# Patient Record
Sex: Female | Born: 1955 | Race: White | Hispanic: No | Marital: Married | Smoking: Never smoker
Health system: Southern US, Community
[De-identification: ages and names within clinical notes are randomized; demographics above are authoritative.]

## PROBLEM LIST (undated history)

## (undated) DIAGNOSIS — Z9119 Patient's noncompliance with other medical treatment and regimen: Secondary | ICD-10-CM

## (undated) DIAGNOSIS — I10 Essential (primary) hypertension: Secondary | ICD-10-CM

## (undated) DIAGNOSIS — R42 Dizziness and giddiness: Secondary | ICD-10-CM

## (undated) DIAGNOSIS — Z91199 Patient's noncompliance with other medical treatment and regimen due to unspecified reason: Secondary | ICD-10-CM

## (undated) DIAGNOSIS — I639 Cerebral infarction, unspecified: Secondary | ICD-10-CM

## (undated) DIAGNOSIS — M8580 Other specified disorders of bone density and structure, unspecified site: Secondary | ICD-10-CM

## (undated) DIAGNOSIS — R809 Proteinuria, unspecified: Secondary | ICD-10-CM

## (undated) DIAGNOSIS — G629 Polyneuropathy, unspecified: Secondary | ICD-10-CM

## (undated) DIAGNOSIS — E119 Type 2 diabetes mellitus without complications: Secondary | ICD-10-CM

## (undated) HISTORY — PX: ABDOMINAL HYSTERECTOMY: SHX81

## (undated) HISTORY — DX: Proteinuria, unspecified: R80.9

## (undated) HISTORY — DX: Essential (primary) hypertension: I10

## (undated) HISTORY — DX: Type 2 diabetes mellitus without complications: E11.9

## (undated) HISTORY — PX: BACK SURGERY: SHX140

## (undated) HISTORY — DX: Patient's noncompliance with other medical treatment and regimen due to unspecified reason: Z91.199

## (undated) HISTORY — DX: Patient's noncompliance with other medical treatment and regimen: Z91.19

## (undated) HISTORY — DX: Other specified disorders of bone density and structure, unspecified site: M85.80

## (undated) HISTORY — DX: Dizziness and giddiness: R42

---

## 2005-07-27 ENCOUNTER — Ambulatory Visit (HOSPITAL_COMMUNITY): Admission: RE | Admit: 2005-07-27 | Discharge: 2005-07-27 | Payer: Self-pay | Admitting: Family Medicine

## 2008-12-09 ENCOUNTER — Ambulatory Visit (HOSPITAL_COMMUNITY): Admission: RE | Admit: 2008-12-09 | Discharge: 2008-12-09 | Payer: Self-pay | Admitting: Obstetrics and Gynecology

## 2009-01-07 ENCOUNTER — Ambulatory Visit: Admission: RE | Admit: 2009-01-07 | Discharge: 2009-01-07 | Payer: Self-pay | Admitting: Gynecology

## 2009-01-07 ENCOUNTER — Encounter: Payer: Self-pay | Admitting: Gynecology

## 2009-01-25 ENCOUNTER — Inpatient Hospital Stay (HOSPITAL_COMMUNITY): Admission: RE | Admit: 2009-01-25 | Discharge: 2009-01-28 | Payer: Self-pay | Admitting: Obstetrics & Gynecology

## 2009-01-25 ENCOUNTER — Encounter: Payer: Self-pay | Admitting: Gynecology

## 2009-03-11 ENCOUNTER — Ambulatory Visit: Admission: RE | Admit: 2009-03-11 | Discharge: 2009-03-11 | Payer: Self-pay | Admitting: Gynecology

## 2009-07-04 ENCOUNTER — Ambulatory Visit (HOSPITAL_COMMUNITY): Admission: RE | Admit: 2009-07-04 | Discharge: 2009-07-04 | Payer: Self-pay | Admitting: Family Medicine

## 2010-11-19 LAB — MISCELLANEOUS TEST

## 2010-11-19 LAB — INHIBIN A: Inhibin-A: <1 pg/mL

## 2010-11-20 LAB — COMPREHENSIVE METABOLIC PANEL
ALT: 13 U/L (ref 0–35)
Alkaline Phosphatase: 62 U/L (ref 39–117)
BUN: 5 mg/dL — ABNORMAL LOW (ref 6–23)
CO2: 28 mEq/L (ref 19–32)
Chloride: 105 mEq/L (ref 96–112)
Glucose, Bld: 215 mg/dL — ABNORMAL HIGH (ref 70–99)
Potassium: 4.5 mEq/L (ref 3.5–5.1)
Sodium: 141 mEq/L (ref 135–145)
Total Bilirubin: 0.4 mg/dL (ref 0.3–1.2)

## 2010-11-20 LAB — DIFFERENTIAL
Basophils Absolute: 0.1 10*3/uL (ref 0.0–0.1)
Eosinophils Absolute: 0.3 10*3/uL (ref 0.0–0.7)
Lymphocytes Relative: 23 % (ref 12–46)
Monocytes Absolute: 0.6 10*3/uL (ref 0.1–1.0)
Neutro Abs: 4.6 10*3/uL (ref 1.7–7.7)
Neutrophils Relative %: 64 % (ref 43–77)

## 2010-11-20 LAB — CBC
HCT: 28.5 % — ABNORMAL LOW (ref 36.0–46.0)
HCT: 34 % — ABNORMAL LOW (ref 36.0–46.0)
Hemoglobin: 9.1 g/dL — ABNORMAL LOW (ref 12.0–15.0)
Hemoglobin: 10.6 g/dL — ABNORMAL LOW (ref 12.0–15.0)
MCHC: 32 g/dL (ref 30.0–36.0)
MCV: 71.2 fL — ABNORMAL LOW (ref 78.0–100.0)
Platelets: 250 10*3/uL (ref 150–400)
RBC: 4 MIL/uL (ref 3.87–5.11)
RBC: 4.87 MIL/uL (ref 3.87–5.11)
RDW: 19.8 % — ABNORMAL HIGH (ref 11.5–15.5)
WBC: 11.7 10*3/uL — ABNORMAL HIGH (ref 4.0–10.5)
WBC: 7.3 10*3/uL (ref 4.0–10.5)

## 2010-11-20 LAB — GLUCOSE, CAPILLARY
Glucose-Capillary: 167 mg/dL — ABNORMAL HIGH (ref 70–99)
Glucose-Capillary: 169 mg/dL — ABNORMAL HIGH (ref 70–99)
Glucose-Capillary: 177 mg/dL — ABNORMAL HIGH (ref 70–99)
Glucose-Capillary: 185 mg/dL — ABNORMAL HIGH (ref 70–99)
Glucose-Capillary: 187 mg/dL — ABNORMAL HIGH (ref 70–99)
Glucose-Capillary: 194 mg/dL — ABNORMAL HIGH (ref 70–99)
Glucose-Capillary: 194 mg/dL — ABNORMAL HIGH (ref 70–99)
Glucose-Capillary: 195 mg/dL — ABNORMAL HIGH (ref 70–99)
Glucose-Capillary: 203 mg/dL — ABNORMAL HIGH (ref 70–99)
Glucose-Capillary: 221 mg/dL — ABNORMAL HIGH (ref 70–99)
Glucose-Capillary: 238 mg/dL — ABNORMAL HIGH (ref 70–99)
Glucose-Capillary: 239 mg/dL — ABNORMAL HIGH (ref 70–99)
Glucose-Capillary: 304 mg/dL — ABNORMAL HIGH (ref 70–99)
Glucose-Capillary: 288 mg/dL — ABNORMAL HIGH (ref 70–99)

## 2010-11-20 LAB — BASIC METABOLIC PANEL
BUN: 2 mg/dL — ABNORMAL LOW (ref 6–23)
CO2: 28 mEq/L (ref 19–32)
Calcium: 8.3 mg/dL — ABNORMAL LOW (ref 8.4–10.5)
Chloride: 104 mEq/L (ref 96–112)
Creatinine, Ser: 0.48 mg/dL (ref 0.4–1.2)
GFR calc Af Amer: >60 mL/min (ref >60)
GFR calc non Af Amer: >60 mL/min (ref >60)
Glucose, Bld: 192 mg/dL — ABNORMAL HIGH (ref 70–99)
Potassium: 4 mEq/L (ref 3.5–5.1)
Sodium: 137 mEq/L (ref 135–145)

## 2010-11-20 LAB — HEMATOCRIT: HCT: 26.8 % — ABNORMAL LOW (ref 36.0–46.0)

## 2010-11-20 LAB — PREGNANCY, URINE: Preg Test, Ur: NEGATIVE

## 2010-11-20 LAB — ABO/RH: ABO/RH(D): A POS

## 2010-11-20 LAB — TYPE AND SCREEN

## 2010-12-15 ENCOUNTER — Ambulatory Visit (HOSPITAL_COMMUNITY)
Admission: RE | Admit: 2010-12-15 | Discharge: 2010-12-15 | Disposition: A | Discharge status: Home / Self Care | Payer: BC Managed Care – PPO | Source: Ambulatory Visit | Attending: Family Medicine | Admitting: Family Medicine

## 2010-12-15 ENCOUNTER — Other Ambulatory Visit: Payer: Self-pay | Admitting: Family Medicine

## 2010-12-15 DIAGNOSIS — R51 Headache: Secondary | ICD-10-CM | POA: Insufficient documentation

## 2010-12-15 DIAGNOSIS — R040 Epistaxis: Secondary | ICD-10-CM | POA: Insufficient documentation

## 2010-12-15 DIAGNOSIS — T148XXA Other injury of unspecified body region, initial encounter: Secondary | ICD-10-CM

## 2010-12-15 DIAGNOSIS — S022XXA Fracture of nasal bones, initial encounter for closed fracture: Secondary | ICD-10-CM | POA: Insufficient documentation

## 2010-12-15 DIAGNOSIS — X58XXXA Exposure to other specified factors, initial encounter: Secondary | ICD-10-CM | POA: Insufficient documentation

## 2010-12-26 NOTE — Consult Note (Signed)
[name] [name], DUERR NO.:  [id number]   MEDICAL RECORD NO.:  [id number]          PATIENT TYPE:  OUT   LOCATION:  GYN                          FACILITY:  [facility]   PHYSICIAN:  De Blanch, M.D.DATE OF BIRTH:  1956/01/30   DATE OF CONSULTATION:  DATE OF DISCHARGE:                                 CONSULTATION   CHIEF COMPLAINT:  Pelvic mass, menometrorrhagia.   HISTORY OF PRESENT ILLNESS:  A 55 year old white married female seen in  consultation at the request of Dr. Henderson Cloud regarding management of a  large abdominal pelvic mass.  The patient apparently presented to Dr.  Henderson Cloud because of heavy vaginal bleeding.  At that time a pelvic mass  was identified.  Ultrasound revealed the mass to be 16.8 x 12.8 x 11.2  cm.  There were some floating internal echoes and no blood flow in the  internal portion of the cyst.  She subsequently has had a CT scan which  does show visual septations with areas of solid nodularity posteriorly  and lateral on the right.  No ascites or adenopathy or any evidence of  metastatic diseases were detected.  CA125 value is 19 units/mL.   The patient has a past history of undergoing a left salpingo-  oophorectomy for an ovarian cyst in 1980.  Otherwise, she has had no  other gynecologic history, although she has not had a pelvic exam in 20  years.  She did have a Pap smear recently that was normal.   PAST MEDICAL HISTORY/MEDICAL ILLNESSES:  1. Diabetes.  2. Hypertension.   CURRENT MEDICATIONS:  1. Metformin.  2. Vasotec.   PAST SURGICAL HISTORY:  Left salpingo-oophorectomy in 1980.   DRUG ALLERGIES:  None.   FAMILY HISTORY:  Mother with breast cancer.  Maternal aunt and cousin  with cervix cancer.  A second aunt with cervix cancer and another aunt  with brain cancer.   SOCIAL HISTORY:  The patient is married.  She comes accompanied by her  husband.  She is not a smoker.   OBSTETRICAL HISTORY:  Gravida 2.   REVIEW OF  SYSTEMS:  A 10-point comprehensive review of systems negative  except as noted above.  The patient really does not have any significant  abdominal or pelvic pain or pressure.   PHYSICAL EXAMINATION:  Height 5 feet 2 inches, weight 164 pounds, blood  pressure 140/90, pulse 80.  GENERAL:  The patient is a healthy white female in no acute distress.  HEENT:  Negative.  NECK:  Supple without thyromegaly.  There is no supraclavicular or  inguinal adenopathy.  ABDOMEN:  Slightly obese.  On palpation there is a mass which is quite  firm extending to just above the umbilicus.  There is no evidence of  ascites.  PELVIC:  EGBUS, vagina, bladder, and urethra are normal.  Cervix appears  normal.  No lesions are noted.  Uterus is difficult to outline.  The  mass does not seem to be deep in the cul-de-sac or pelvis.  RECTOVAGINAL:  Confirms.   IMPRESSION:  1. Complex pelvic mass with internal echoes and  nodularity.  It was      recommended that the patient undergo exploratory laparotomy, total      abdominal hysterectomy, and a resection of the mass which I believe      arises from the right ovary.  2. Menometrorrhagia in a 55 year old.  I recommend that we rule out      any intrauterine pathology.   PROCEDURE NOTE:  Using a Pipelle aspirator endometrial biopsy is  obtained without difficulty.  Uterus sounds approximately 10 cm  anteriorly.   We will await the biopsy report, but in the meantime will prepare for  surgery in collaboration with Dr. Henderson Cloud.  Risks of surgery were  discussed, and we will contact Dr. Huel Coventry office to coordinate a  surgical date.  Will contact the patient with the biopsy reports once  they are available.      De Blanch, M.D.  Electronically Signed     DC/MEDQ  D:  01/07/2009  T:  01/07/2009  Job:  [id number]   cc:   Guy Sandifer. Henderson Cloud, M.D.  Fax: [phone]   Telford Nab, R.N.  [address]  [place], [place] [address]

## 2010-12-26 NOTE — Op Note (Signed)
[name] [name] [name], [name] [name]                ACCOUNT NO.:  [id number]   MEDICAL RECORD NO.:  [id number]          PATIENT TYPE:  INP   LOCATION:  1535                         FACILITY:  [facility]   PHYSICIAN:  De Blanch, M.D.DATE OF BIRTH:  1955/10/03   DATE OF PROCEDURE:  DATE OF DISCHARGE:                               OPERATIVE REPORT   PREOPERATIVE DIAGNOSIS:  Complex pelvic mass.   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   PROCEDURE:  Total abdominal hysterectomy, right salpingo-oophorectomy,  and left salpingectomy.   FIRST ASSISTANT:  Antionette Char, MD, and Telford Nab, R.N.   ANESTHESIA:  General with orotracheal tube.   ESTIMATED BLOOD LOSS:  100 mL.   SURGICAL FINDINGS:  At the time of exploratory laparotomy, the patient  had an approximately 15-cm right ovarian cyst which was smooth on the  surface  with no evidence of excrescences or adhesions.  The frozen  section returned showing this to be a serous cystadenoma as well as a  small area of granulosa cell tumor.  Also on frozen section, the patient  was found to have fibroids, adenomyosis, and uterine polyp.  The patient  previously had the left ovary removed, although the left fallopian tube  remained and was removed at the time of surgery today.   DESCRIPTION OF PROCEDURE:  The patient was brought to the operating  room.  After satisfactory attainment of general anesthesia, she was  placed in modified lithotomy position in Two Strike stirrups.  Anterior  abdominal wall, perineum, and vagina were prepped with Betadine.  Foley  catheter was inserted, and the patient was draped.   The abdomen was entered through a right paramedian incision.  Peritoneal  washings were obtained from the pelvis.  The upper abdomen was explored,  and no abnormal findings were noted.  The omentum was adherent to the  prior peritoneal  incision.  The adhesions were lysed sharply.  The  pelvis was explored with the above-noted findings.  A Bookwalter  retractor was positioned and bowel packed out of the pelvis.   The right peritoneal incision on the pelvic sidewall was created and the  retroperitoneal space opened, identifying the ureter, external iliac  artery, internal iliac artery, and ovarian vessels.  The ovarian vessels  were skeletonized, clamped, cut, free tied, and suture ligated.  The  uterine cornu was grasped with Kelly clamps and parametrial clamp.  The  tube, uterine-ovarian anastomosis, and ovarian ligament were divided.  The right tube and ovary were sent to frozen section with the above-  noted findings.   The left retroperitoneal space was opened.  The ovarian vessels were  skeletonized, clamped, cut, free tied, and suture ligated.  The bladder  flap was advanced with sharp dissection.  The uterine vessels were  skeletonized, clamped, cut, and suture ligated in stepwise fashion.  The  paracervical and cardinal ligaments were clamped, cut, and suture  ligated.   The rectovaginal septum was opened.  The uterosacral ligament and  vaginal angle were cross clamped and the vagina transected from  its  connection to the cervix.  The uterus, cervix, and left tube were handed  off the operative field.  Vaginal angles were transfixed.  The central  portion of the vagina was closed with interrupted figure-of-eight  sutures of zero Vicryl.  The pelvis was irrigated and then found to be  hemostatic.  Frozen section returned with the above-noted findings.  The  procedure was then terminated.   The packs and retractors were removed.  The anterior abdominal wall was  closed in layers, the first being a running mass closure using #1 PDS.  Subcutaneous tissue was irrigated.  Hemostasis was achieved with  cautery.  The skin was closed with skin staples.  A dressing was  applied.  The patient was awakened from  anesthesia and taken to the  recovery room in satisfactory condition.  Sponge, needle, and instrument  counts were correct x2.      De Blanch, M.D.  Electronically Signed     DC/MEDQ  D:  01/25/2009  T:  01/25/2009  Job:  [id number]   cc:   Roseanna Rainbow, M.D.  Fax: [phone]   Telford Nab, R.N.  [address]  [place], [place] [address]   Guy Sandifer. Henderson Cloud, M.D.  Fax: [phone]

## 2010-12-26 NOTE — Consult Note (Signed)
[name] [name], LEVELS NO.:  [id number]   MEDICAL RECORD NO.:  [id number]          PATIENT TYPE:  OUT   LOCATION:  GYN                          FACILITY:  [facility]   PHYSICIAN:  De Blanch, M.D.DATE OF BIRTH:  [birth year]   DATE OF CONSULTATION:  03/11/2009  DATE OF DISCHARGE:                                 CONSULTATION   CHIEF COMPLAINT:  Postoperative followup, granulosa cell tumor.   INTERVAL HISTORY:  The patient underwent a total abdominal hysterectomy  and bilateral salpingo-oophorectomy for a large ovarian tumor on January 25, 2009.  Final pathology showed this to be a 20 cm granulosa cell  tumor.  The endometrium showed proliferative endometrium with an  endometrial polyp.  The patient's left ovary had been previously  removed.  No evidence of metastatic disease was found.  The patient has  had an uncomplicated postoperative course except for a small wound  defect which has been packed and now apparently is superficial.  She is  complaining of hot flashes and some soreness in her wound.   PHYSICAL EXAMINATION:  VITAL SIGNS:  Weight 154 pounds, blood pressure  110/60.  ABDOMEN:  Soft, nontender.  Midline incision is healing well.  There is  a 1 cm defect that has good granulation tissue in it.  There is some  induration around the wound.  No mass, organomegaly, ascites or hernias  are noted.  PELVIC:  EGBUS, vagina, bladder and urethra are normal.  Cervix and  uterus are surgically absent.  The vaginal cuff is healing well.  Bimanual exam reveals minimal postoperative induration.   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   I discussed the natural history of granulosa cell tumors with the  patient and her husband.  They understand there is a small risk of  recurrence.  We will monitor her with serum inhibin and MIS, both will  be drawn today.  I plan on seeing the patient back in 6 months.  We will  alternate visits with Dr.  Henderson Cloud.  She is given a prescription for  Premarin 0.65 mg daily and is given the okay to return to work and other  full-time and unrestricted activities.      De Blanch, M.D.  Electronically Signed     DC/MEDQ  D:  03/11/2009  T:  03/11/2009  Job:  [id number]   cc:   Telford Nab, R.N.  [address]  [place], [place] [address]   Guy Sandifer. Henderson Cloud, M.D.  Fax: [phone]

## 2010-12-29 NOTE — Discharge Summary (Signed)
NAMEMATASHA, [name] [name]                ACCOUNT NO.:  [id number]   MEDICAL RECORD NO.:  [id number]          PATIENT TYPE:  INP   LOCATION:  1535                         [facility]ACILITY:  [facility]   PHYSICIAN:  Roseanna Rainbow, M.D.DATE OF BIRTH:  [birth year]   DATE OF ADMISSION:  01/25/2009  DATE OF DISCHARGE:  01/28/2009                               DISCHARGE SUMMARY   CHIEF COMPLAINT:  The patient is a 55 year old with a pelvic mass and  associated menometrorrhagia who presents for operative management.  Please see the dictated history and physical as per Dr. Stanford Breed  for further details.   HOSPITAL COURSE:  The patient was admitted and underwent a total  abdominal hysterectomy, right salpingo-oophorectomy and left  salpingectomy.  Please see the dictated operative summary.  On  postoperative day one a hemoglobin was 9.1, hematocrit was 28.5, basic  metabolic profile was remarkable for a glucose of 192.  A repeat  hematocrit on June 17 was 26.8.  Her CBGs were initially elevated above  200, her metformin was resumed on postoperative day one and meal  coverage was added with a sliding scale NovoLog regimen.  Her glycemic  control improved on this regimen and her CBGs were under 200.  She was  discharged to home on postoperative day three tolerating a regular diet.   DISCHARGE DIAGNOSES:  1. A right-sided ovarian granulosa cell tumor.  2. Endometrial polyp.  3. Adenomyosis.  4. Anemia.   PROCEDURES:  1. Total abdominal hysterectomy.  2. Right salpingo-oophorectomy.  3. Left salpingectomy.   CONDITION:  Stable.   DIET:  ADA diet.   ACTIVITY:  Pelvic rest, progressive activity.   MEDICATIONS:  Metformin, Vasotec, a prescription was given for Percocet  1 - 2 tablets every 6 hours as needed.   DISPOSITION:  The patient was to follow up in the GYN/Oncology office  for routine postoperative care.      Roseanna Rainbow, M.D.  Electronically Signed     LAJ/MEDQ  D:   01/31/2009  T:  01/31/2009  Job:  [id number]   cc:   Harold Hedge, [phone], MD   Telford Nab, R.N.  [address]  [place], [place] [address]

## 2013-02-16 ENCOUNTER — Encounter: Payer: Self-pay | Admitting: *Deleted

## 2014-02-23 ENCOUNTER — Encounter: Payer: Self-pay | Admitting: Family Medicine

## 2014-06-18 ENCOUNTER — Encounter (HOSPITAL_COMMUNITY): Payer: Self-pay | Admitting: *Deleted

## 2014-06-18 ENCOUNTER — Emergency Department (HOSPITAL_COMMUNITY): Payer: BC Managed Care – PPO

## 2014-06-18 ENCOUNTER — Emergency Department (HOSPITAL_COMMUNITY)
Admission: EM | Admit: 2014-06-18 | Discharge: 2014-06-18 | Disposition: A | Discharge status: Home / Self Care | Payer: BC Managed Care – PPO | Attending: Emergency Medicine | Admitting: Emergency Medicine

## 2014-06-18 DIAGNOSIS — Z9119 Patient's noncompliance with other medical treatment and regimen: Secondary | ICD-10-CM | POA: Diagnosis not present

## 2014-06-18 DIAGNOSIS — E119 Type 2 diabetes mellitus without complications: Secondary | ICD-10-CM | POA: Insufficient documentation

## 2014-06-18 DIAGNOSIS — M5416 Radiculopathy, lumbar region: Secondary | ICD-10-CM | POA: Diagnosis not present

## 2014-06-18 DIAGNOSIS — Z794 Long term (current) use of insulin: Secondary | ICD-10-CM | POA: Diagnosis not present

## 2014-06-18 DIAGNOSIS — M79604 Pain in right leg: Secondary | ICD-10-CM | POA: Diagnosis present

## 2014-06-18 DIAGNOSIS — M549 Dorsalgia, unspecified: Secondary | ICD-10-CM

## 2014-06-18 DIAGNOSIS — Z79899 Other long term (current) drug therapy: Secondary | ICD-10-CM | POA: Insufficient documentation

## 2014-06-18 DIAGNOSIS — I10 Essential (primary) hypertension: Secondary | ICD-10-CM | POA: Diagnosis not present

## 2014-06-18 MED ORDER — CYCLOBENZAPRINE HCL 10 MG PO TABS
5.0000 mg | ORAL_TABLET | Freq: Once | ORAL | Status: AC
  Administered 2014-06-18: 5 mg via ORAL
  Filled 2014-06-18: qty 1

## 2014-06-18 MED ORDER — HYDROCODONE-ACETAMINOPHEN 5-325 MG PO TABS
2.0000 | ORAL_TABLET | Freq: Once | ORAL | Status: AC
  Administered 2014-06-18: 2 via ORAL
  Filled 2014-06-18: qty 2

## 2014-06-18 MED ORDER — CYCLOBENZAPRINE HCL 5 MG PO TABS: 5.0000 mg | ORAL_TABLET | Freq: Three times a day (TID) | ORAL | Status: DC | PRN

## 2014-06-18 MED ORDER — ONDANSETRON 8 MG PO TBDP
8.0000 mg | ORAL_TABLET | Freq: Once | ORAL | Status: AC
  Administered 2014-06-18: 8 mg via ORAL
  Filled 2014-06-18: qty 1

## 2014-06-18 MED ORDER — HYDROMORPHONE HCL 1 MG/ML IJ SOLN
0.5000 mg | Freq: Once | INTRAMUSCULAR | Status: AC
  Administered 2014-06-18: 0.5 mg via INTRAMUSCULAR
  Filled 2014-06-18: qty 1

## 2014-06-18 MED ORDER — HYDROCODONE-ACETAMINOPHEN 5-325 MG PO TABS: 1.0000 | ORAL_TABLET | ORAL | Status: DC | PRN

## 2014-06-18 NOTE — ED Notes (Signed)
Patient verbalizes understanding of discharge instructions, prescription medications, home care, and follow up care. Patient out of department at this time. 

## 2014-06-18 NOTE — Discharge Instructions (Signed)

## 2014-06-18 NOTE — ED Notes (Addendum)
Pt states that she bent over to pick a small ice chest and when she stood back up, the pain is shooting down her right  Leg. Pt states it hurts to walk.

## 2014-06-19 NOTE — ED Provider Notes (Signed)
CSN: [id number]     Arrival date & time 06/18/14  2050 History   First MD Initiated Contact with Patient 06/18/14 2132     Chief Complaint  Patient presents with  . Leg Pain     (Consider location/radiation/quality/duration/timing/severity/associated sxs/prior Treatment) The history is provided by the patient.   [name] [name] is a 58 y.o. female presenting with right lower posterior back and thigh pain which started 2 days ago suddenly while bending over to pick up a small ice chest.  She felt a sudden popping sensation followed by shooting pain to her mid right posterior leg.  Pain is worsened with movement and she cannot stay in any position for long without increased discomfort.  She has taken ibuprofen with minimal relief. She reports similar episode in the past but resolved without delay.  She denies fevers, chills, dysuria, urinary or fecal incontinence or retention.  She does have a history of osteopenia.  No IVDU, no h/o ca.     Past Medical History  Diagnosis Date  . Type 2 diabetes mellitus   . Hypertension   . Microproteinuria   . Noncompliance   . Osteopenia    Past Surgical History  Procedure Laterality Date  . Abdominal hysterectomy     Family History  Problem Relation Age of Onset  . Diabetes Mother    History  Substance Use Topics  . Smoking status: Never Smoker   . Smokeless tobacco: Not on file  . Alcohol Use: No   OB History    No data available     Review of Systems  Constitutional: Negative for fever.  Respiratory: Negative for shortness of breath.   Cardiovascular: Negative for chest pain and leg swelling.  Gastrointestinal: Negative for abdominal pain, constipation and abdominal distention.  Genitourinary: Negative for dysuria, urgency, frequency, flank pain and difficulty urinating.  Musculoskeletal: Positive for back pain. Negative for joint swelling and gait problem.  Skin: Negative for rash.  Neurological: Negative for weakness and  numbness.      Allergies  Review of patient's allergies indicates no known allergies.  Home Medications   Prior to Admission medications   Medication Sig Start Date End Date Taking? Authorizing Provider  ibuprofen (ADVIL,MOTRIN) 200 MG tablet Take 200 mg by mouth every 6 (six) hours as needed.   Yes Historical Provider, MD  insulin glargine (LANTUS) 100 UNIT/ML injection Inject 30 Units into the skin at bedtime.   Yes Historical Provider, MD  metFORMIN (GLUCOPHAGE) 1000 MG tablet Take 1,000 mg by mouth 2 (two) times daily with a meal.   Yes Historical Provider, MD  cyclobenzaprine (FLEXERIL) 5 MG tablet Take 1 tablet (5 mg total) by mouth 3 (three) times daily as needed for muscle spasms. 06/18/14   Burgess AmorJulie Shuntay Everetts, PA-C  HYDROcodone-acetaminophen (NORCO/VICODIN) 5-325 MG per tablet Take 1 tablet by mouth every 4 (four) hours as needed. 06/18/14   Burgess AmorJulie Lajuana Patchell, PA-C   BP 191/67 mmHg  Pulse 59  Temp(Src) 97.9 F (36.6 C) (Oral)  Resp 18  Ht 5\' 2"  (1.575 m)  Wt 153 lb (69.4 kg)  BMI 27.98 kg/m2  SpO2 100% Physical Exam  Constitutional: She appears well-developed and well-nourished.  HENT:  Head: Normocephalic.  Eyes: Conjunctivae are normal.  Neck: Normal range of motion. Neck supple.  Cardiovascular: Normal rate and intact distal pulses.   Pedal pulses normal.  Pulmonary/Chest: Effort normal.  Abdominal: Soft. Bowel sounds are normal. She exhibits no distension and no mass.  Musculoskeletal: Normal range of  motion. She exhibits no edema.       Lumbar back: She exhibits tenderness. She exhibits no swelling, no edema and no spasm.  ttp along right posterior pelvic rim to right SI joint.  No midline lumbar pain.  Neurological: She is alert. She has normal strength. She displays no atrophy and no tremor. No sensory deficit. Gait normal.  Reflex Scores:      Patellar reflexes are 2+ on the right side and 2+ on the left side.      Achilles reflexes are 2+ on the right side and 2+ on the  left side. No strength deficit noted in hip and knee flexor and extensor muscle groups.  Ankle flexion and extension intact.  Skin: Skin is warm and dry.  Psychiatric: She has a normal mood and affect.  Nursing note and vitals reviewed.   ED Course  Procedures (including critical care time) Labs Review Labs Reviewed - No data to display  Imaging Review Dg Lumbar Spine Complete  06/18/2014   CLINICAL DATA:  Low back pain and right hip pain.  Lifting injury.  EXAM: LUMBAR SPINE - COMPLETE 4+ VIEW  COMPARISON:  CT 12/09/2008  FINDINGS: Endplate deformity at the superior aspect of T12 is felt to represent degenerative osteophytosis. There is mild endplate spurring from L1 through L3. There is no evidence acute loss vertebral body height and disc height. No pars fracture. No subluxation.  IMPRESSION: No acute findings of the lumbar spine.   Electronically Signed   By: Genevive BiStewart  Edmunds M.D.   On: 06/18/2014 22:36   Dg Hip Bilateral W/pelvis  06/18/2014   CLINICAL DATA:  Right hip pain and leg pain for 3 days.  EXAM: BILATERAL HIP WITH PELVIS - 4+ VIEW  COMPARISON:  None.  FINDINGS: Hips are located. No evidence of pelvic fracture or sacral fracture. Dedicated views of the left and right hips demonstrate no femoral neck fracture.  IMPRESSION: No evidence of pelvic fracture or femoral neck fracture.   Electronically Signed   By: Genevive BiStewart  Edmunds M.D.   On: 06/18/2014 22:37     EKG Interpretation None      MDM   Final diagnoses:  Back pain  Lumbar radiculopathy, acute    Patients labs and/or radiological studies were viewed and considered during the medical decision making and disposition process. Pt with sciatica.  She was very uncomfortable upon first arrival, given dilaudid 0.5 mg IM so would tolerate moving for xrays.  Pain improved, started to return at end of visit.  Given hydrocodone 2 tabs prior to dc home.  Dg driving.  Also prescribed flexeril, encouraged to continue ibuprofen 600  mg q 8 with food. F/u with pcp in 1 week if not improved. ([place]).  No neuro deficit on exam or by history to suggest emergent or surgical presentation.  Also discussed worsened sx that should prompt immediate re-evaluation including distal weakness, bowel/bladder retention/incontinence.          Burgess AmorJulie Uilani Sanville, PA-C 06/19/14 1337  Glynn OctaveStephen Rancour, MD 06/19/14 1535

## 2014-06-23 ENCOUNTER — Encounter: Payer: Self-pay | Admitting: Family Medicine

## 2014-06-23 ENCOUNTER — Ambulatory Visit (INDEPENDENT_AMBULATORY_CARE_PROVIDER_SITE_OTHER): Payer: BC Managed Care – PPO | Admitting: Family Medicine

## 2014-06-23 VITALS — BP 130/76 | Temp 97.5°F | Ht 62.0 in | Wt 153.4 lb

## 2014-06-23 DIAGNOSIS — M5441 Lumbago with sciatica, right side: Secondary | ICD-10-CM

## 2014-06-23 MED ORDER — HYDROCODONE-ACETAMINOPHEN 5-325 MG PO TABS: ORAL_TABLET | ORAL | Status: DC

## 2014-06-23 NOTE — Progress Notes (Signed)
   Subjective:    Patient ID: [name] StarrJanice P Salvador, female    DOB: 06/01/1956, 58 y.o.   MRN: [id number]  Back Pain This is a new problem. The current episode started in the past 7 days. The problem occurs intermittently. The problem is unchanged. The pain is present in the lumbar spine. The pain radiates to the right thigh. The pain is moderate. The pain is the same all the time. Stiffness is present all day. She has tried NSAIDs, heat and muscle relaxant (hydrocodone) for the symptoms. The treatment provided moderate relief.  [facility] for back pain also.   Patient states that she has no other concerns at this time.    Pain is very severe. Primarily right lumbar radiating into the hip thigh in the calf all the way down to the foot.  At times right foot is feeling numb and weak.   Pain is worsening not improving.  Seen in emergency room ER note reviewed.  No history of ruptured disc  Positive history of insulin-dependent diabetes mellitus Review of Systems  Musculoskeletal: Positive for back pain.  no change in urinary or bowel habits no headache no chest pain ROS otherwise negative     Objective:   Physical Exam  Alert significant distress. HEENT normal. Lungs clear. Heart regular in rhythm. Positive straight leg raise on the right severely tender right sciatic notch tenderness. Sensation diminished lateral foot. Question weakness dorsiflexion right great toe      Assessment & Plan:  Impression sciatica severe worsening plan cannot tolerate prednisone due to steroids. Pain medicine prescribed. MRI lumbar spine. Further recommendations based results. WSL

## 2014-06-28 ENCOUNTER — Ambulatory Visit (HOSPITAL_COMMUNITY)
Admission: RE | Admit: 2014-06-28 | Discharge: 2014-06-28 | Disposition: A | Discharge status: Home / Self Care | Payer: BC Managed Care – PPO | Source: Ambulatory Visit | Attending: Family Medicine | Admitting: Family Medicine

## 2014-06-28 ENCOUNTER — Encounter (HOSPITAL_COMMUNITY): Payer: Self-pay

## 2014-06-28 DIAGNOSIS — R200 Anesthesia of skin: Secondary | ICD-10-CM | POA: Insufficient documentation

## 2014-06-28 DIAGNOSIS — M5127 Other intervertebral disc displacement, lumbosacral region: Secondary | ICD-10-CM | POA: Diagnosis not present

## 2014-06-28 DIAGNOSIS — M545 Low back pain: Secondary | ICD-10-CM | POA: Insufficient documentation

## 2014-06-28 DIAGNOSIS — M79604 Pain in right leg: Secondary | ICD-10-CM | POA: Diagnosis not present

## 2014-06-29 ENCOUNTER — Other Ambulatory Visit: Payer: Self-pay

## 2014-06-29 ENCOUNTER — Encounter: Payer: Self-pay | Admitting: Family Medicine

## 2014-06-29 DIAGNOSIS — M545 Low back pain: Secondary | ICD-10-CM

## 2014-06-29 NOTE — Progress Notes (Signed)
Work not done

## 2014-06-30 ENCOUNTER — Other Ambulatory Visit (HOSPITAL_COMMUNITY): Payer: BC Managed Care – PPO

## 2014-07-05 ENCOUNTER — Telehealth: Payer: Self-pay | Admitting: Family Medicine

## 2014-07-05 NOTE — Telephone Encounter (Signed)
Patient notified that she can have work excuse extended till Dec 1 when she sees neurosurgeon and then the will take over.

## 2014-07-05 NOTE — Telephone Encounter (Signed)
Work exc thru visit, with compr on nerve this will be painful in all positions, we don't longer rec strict bed rest like the old days. rec getting up and walking in the house for a couple minutes sevral times per d even tho it hurts, also get up for meals and bathroom visits

## 2014-07-05 NOTE — Telephone Encounter (Signed)
Pt needs work note or FMLA extended to cover her being out of work until she sees Midwifeneurosurgeon, appointment with Dr. Wynetta Emeryram is 07/13/2014, also pt's a little confused as to what you recommend she is to be doing as far as walking, daily activities?  States there's only one position she's able to lay down and be close to pain free.  [name] [name](Erica may already have FMLA paperwork from [employer])

## 2014-10-19 LAB — HM DIABETES EYE EXAM

## 2014-11-01 ENCOUNTER — Encounter: Payer: Self-pay | Admitting: *Deleted

## 2015-02-02 ENCOUNTER — Encounter: Payer: Self-pay | Admitting: Family Medicine

## 2015-03-11 ENCOUNTER — Other Ambulatory Visit: Payer: Self-pay | Admitting: Internal Medicine

## 2015-03-11 ENCOUNTER — Ambulatory Visit
Admission: RE | Admit: 2015-03-11 | Discharge: 2015-03-11 | Disposition: A | Discharge status: Home / Self Care | Payer: BLUE CROSS/BLUE SHIELD | Source: Ambulatory Visit | Attending: Internal Medicine | Admitting: Internal Medicine

## 2015-03-11 DIAGNOSIS — S99921A Unspecified injury of right foot, initial encounter: Secondary | ICD-10-CM

## 2015-05-17 ENCOUNTER — Ambulatory Visit (INDEPENDENT_AMBULATORY_CARE_PROVIDER_SITE_OTHER): Payer: BLUE CROSS/BLUE SHIELD | Admitting: Family Medicine

## 2015-05-17 ENCOUNTER — Encounter: Payer: Self-pay | Admitting: Family Medicine

## 2015-05-17 ENCOUNTER — Ambulatory Visit (HOSPITAL_COMMUNITY)
Admission: RE | Admit: 2015-05-17 | Discharge: 2015-05-17 | Disposition: A | Discharge status: Home / Self Care | Payer: BLUE CROSS/BLUE SHIELD | Source: Ambulatory Visit | Attending: Family Medicine | Admitting: Family Medicine

## 2015-05-17 ENCOUNTER — Telehealth: Payer: Self-pay | Admitting: *Deleted

## 2015-05-17 VITALS — BP 190/80 | Ht 62.0 in | Wt 158.1 lb

## 2015-05-17 DIAGNOSIS — R918 Other nonspecific abnormal finding of lung field: Secondary | ICD-10-CM | POA: Insufficient documentation

## 2015-05-17 DIAGNOSIS — M549 Dorsalgia, unspecified: Secondary | ICD-10-CM

## 2015-05-17 DIAGNOSIS — R0600 Dyspnea, unspecified: Secondary | ICD-10-CM

## 2015-05-17 DIAGNOSIS — R0602 Shortness of breath: Secondary | ICD-10-CM | POA: Insufficient documentation

## 2015-05-17 DIAGNOSIS — I10 Essential (primary) hypertension: Secondary | ICD-10-CM

## 2015-05-17 LAB — POCT I-STAT CREATININE: Creatinine, Ser: 0.7 mg/dL (ref 0.44–1.00)

## 2015-05-17 MED ORDER — IOHEXOL 350 MG/ML SOLN
100.0000 mL | Freq: Once | INTRAVENOUS | Status: AC | PRN
  Administered 2015-05-17: 100 mL via INTRAVENOUS

## 2015-05-17 MED ORDER — ENALAPRIL MALEATE 20 MG PO TABS: 20.0000 mg | ORAL_TABLET | Freq: Every day | ORAL | Status: DC

## 2015-05-17 NOTE — Telephone Encounter (Signed)
Stat scan results available. Please review

## 2015-05-17 NOTE — Progress Notes (Signed)
   Subjective:    Patient ID: [name] [name], female    DOB: 1955/10/21, 59 y.o.   MRN: [id number]  Hypertension This is a chronic problem. The current episode started more than 1 year ago. Associated symptoms include chest pain.  Sharp pain in back, very painful. Transient  Pain  's struck rather hard for a short period of timeand now has recurred since. This occurred last night. Today she has had some shortness of breath. Not sure why this is going on. Patient does not smoke no history of asthma.  Notes her sister had a very similar bout a few weeks ago ended up in the emergency room and was diagnosed with pulmonary emboli.  No family history of aortic aneurysm. Patient has not smoked. However she has had poorly controlled diabetes and admits to noncompliance with her hypertension. Medications.  No abdominal pain. No true chest pain.     Patient states that she has been on enalapril but has missed doses. Took one tablet of enalapril last night. Has complaints of pain under right shoulder blade.Patient states during pains she has some SOB.  Review of Systems  Cardiovascular: Positive for chest pain.   No nausea no vomiting no change in bowel habits    Objective:   Physical Exam  Alert blood pressure 160/90 on repeat. HEENT normal. Lungs clear no tachypnea no crackles no wheezes heart regular in rhythm abdomen benign no spinal tenderness no CVA tenderness  EKG normal sinus rhythm and nonspecific ST-T changes      Assessment & Plan:  Impression 1 sudden onset of severe back pain improved now. Also significant shortness of breath. discussed at length. With severe onset sudden back pain. Poorly controlled risk factors. Elevated blood pressure. And substantial shortness of breath need to do a scan to rule out or diminished likelihood of pulmonary embolus and aneurysm discussed importance discussed increase enalapril to 20 daily compliance discussed [facility]SL

## 2015-05-17 NOTE — Telephone Encounter (Signed)
Already reviewed with autumn and then the pt

## 2015-05-24 ENCOUNTER — Encounter: Payer: Self-pay | Admitting: Family Medicine

## 2015-05-24 ENCOUNTER — Ambulatory Visit (INDEPENDENT_AMBULATORY_CARE_PROVIDER_SITE_OTHER): Payer: BLUE CROSS/BLUE SHIELD | Admitting: Family Medicine

## 2015-05-24 VITALS — BP 160/80 | Ht 62.0 in | Wt 157.0 lb

## 2015-05-24 DIAGNOSIS — L03211 Cellulitis of face: Secondary | ICD-10-CM

## 2015-05-24 DIAGNOSIS — I10 Essential (primary) hypertension: Secondary | ICD-10-CM | POA: Diagnosis not present

## 2015-05-24 DIAGNOSIS — M549 Dorsalgia, unspecified: Secondary | ICD-10-CM

## 2015-05-24 MED ORDER — DOXYCYCLINE HYCLATE 100 MG PO TABS: 100.0000 mg | ORAL_TABLET | Freq: Two times a day (BID) | ORAL | Status: DC

## 2015-05-24 NOTE — Progress Notes (Signed)
   Subjective:    Patient ID: [name] [name], female    DOB: [birth year], 59 y.o.   MRN: [id number]  Hypertension This is a new problem. The current episode started more than 1 year ago.    Patient in for a one week follow up for hypertension   and back pain. The sharp jabbing pain is now better  Ear acting up the past two weeks  Patient states that back pain has somewhat improved. Still having sharp spells of back pain that much improved overall.  Patient also would also like to discuss ear pain to right ear. External ear visually red with pain to the touch. Noted no significant femur but definitely tender.  Patient has adjusted blood pressure medicine. Now taking higher dose. No obvious side effects.  Patient states no other concerns this visit.    Review of Systems No change in her bowel habits no vomiting no diarrhea complete review systems otherwise negative    Objective:   Physical Exam  Alert vital stable afebrile blood pressure improved 138/84. H&T right ear inflamed erythematous tender to palpation. Supple no spinal tenderness no severe turns lungs clear heart rare rhythm. Abdomen benign.      Assessment & Plan:  Impression 1 right ear cellulitis discussed #2 hypertension clinically improved on high-dose discussed #3 back pain improved plan continue symptom care for back. Add doxycycline twice a day 10 days same blood pressure medicine. Continue Advil for back. Diet exercise discussed warning signs discussed patient wishes to follow-up with her nurse practitioner for blood pressure control. She's diabetes specialist also [facility]

## 2015-11-17 ENCOUNTER — Emergency Department (HOSPITAL_COMMUNITY): Payer: BLUE CROSS/BLUE SHIELD

## 2015-11-17 ENCOUNTER — Emergency Department (HOSPITAL_COMMUNITY)
Admission: EM | Admit: 2015-11-17 | Discharge: 2015-11-17 | Disposition: A | Discharge status: Home / Self Care | Payer: BLUE CROSS/BLUE SHIELD | Attending: Emergency Medicine | Admitting: Emergency Medicine

## 2015-11-17 ENCOUNTER — Encounter (HOSPITAL_COMMUNITY): Payer: Self-pay | Admitting: Emergency Medicine

## 2015-11-17 DIAGNOSIS — L03116 Cellulitis of left lower limb: Secondary | ICD-10-CM | POA: Diagnosis not present

## 2015-11-17 DIAGNOSIS — Z79899 Other long term (current) drug therapy: Secondary | ICD-10-CM | POA: Diagnosis not present

## 2015-11-17 DIAGNOSIS — Z794 Long term (current) use of insulin: Secondary | ICD-10-CM | POA: Diagnosis not present

## 2015-11-17 DIAGNOSIS — E1140 Type 2 diabetes mellitus with diabetic neuropathy, unspecified: Secondary | ICD-10-CM | POA: Diagnosis not present

## 2015-11-17 DIAGNOSIS — I10 Essential (primary) hypertension: Secondary | ICD-10-CM | POA: Insufficient documentation

## 2015-11-17 DIAGNOSIS — Z7984 Long term (current) use of oral hypoglycemic drugs: Secondary | ICD-10-CM | POA: Insufficient documentation

## 2015-11-17 DIAGNOSIS — S92421A Displaced fracture of distal phalanx of right great toe, initial encounter for closed fracture: Secondary | ICD-10-CM | POA: Diagnosis not present

## 2015-11-17 DIAGNOSIS — L089 Local infection of the skin and subcutaneous tissue, unspecified: Secondary | ICD-10-CM | POA: Diagnosis not present

## 2015-11-17 DIAGNOSIS — R21 Rash and other nonspecific skin eruption: Secondary | ICD-10-CM | POA: Diagnosis present

## 2015-11-17 HISTORY — DX: Polyneuropathy, unspecified: G62.9

## 2015-11-17 LAB — CBC WITH DIFFERENTIAL/PLATELET
Basophils Absolute: 0.1 10*3/uL (ref 0.0–0.1)
Basophils Relative: 1 %
EOS PCT: 4 %
Eosinophils Absolute: 0.4 10*3/uL (ref 0.0–0.7)
HEMATOCRIT: 37.7 % (ref 36.0–46.0)
HEMOGLOBIN: 12.1 g/dL (ref 12.0–15.0)
LYMPHS PCT: 26 %
Lymphs Abs: 2.8 10*3/uL (ref 0.7–4.0)
MCH: 26.1 pg (ref 26.0–34.0)
MCHC: 32.1 g/dL (ref 30.0–36.0)
MCV: 81.4 fL (ref 78.0–100.0)
MONO ABS: 1 10*3/uL (ref 0.1–1.0)
Monocytes Relative: 9 %
Neutro Abs: 6.5 10*3/uL (ref 1.7–7.7)
Neutrophils Relative %: 60 %
Platelets: 295 10*3/uL (ref 150–400)
RBC: 4.63 MIL/uL (ref 3.87–5.11)
RDW: 14.7 % (ref 11.5–15.5)
WBC: 10.6 10*3/uL — ABNORMAL HIGH (ref 4.0–10.5)

## 2015-11-17 LAB — COMPREHENSIVE METABOLIC PANEL
ALBUMIN: 3.6 g/dL (ref 3.5–5.0)
ALK PHOS: 65 U/L (ref 38–126)
ALT: 15 U/L (ref 14–54)
ANION GAP: 8 (ref 5–15)
AST: 18 U/L (ref 15–41)
BILIRUBIN TOTAL: 0.4 mg/dL (ref 0.3–1.2)
BUN: 11 mg/dL (ref 6–20)
CALCIUM: 8.9 mg/dL (ref 8.9–10.3)
CO2: 26 mmol/L (ref 22–32)
CREATININE: 0.6 mg/dL (ref 0.44–1.00)
Chloride: 106 mmol/L (ref 101–111)
GFR calc Af Amer: >60 mL/min (ref >60)
GFR calc non Af Amer: >60 mL/min (ref >60)
Glucose, Bld: 113 mg/dL — ABNORMAL HIGH (ref 65–99)
Potassium: 3.7 mmol/L (ref 3.5–5.1)
SODIUM: 140 mmol/L (ref 135–145)
TOTAL PROTEIN: 7.1 g/dL (ref 6.5–8.1)

## 2015-11-17 MED ORDER — DOXYCYCLINE HYCLATE 100 MG PO CAPS: 100.0000 mg | ORAL_CAPSULE | Freq: Two times a day (BID) | ORAL | Status: DC

## 2015-11-17 MED ORDER — DOXYCYCLINE HYCLATE 100 MG PO TABS
100.0000 mg | ORAL_TABLET | Freq: Once | ORAL | Status: AC
  Administered 2015-11-17: 100 mg via ORAL
  Filled 2015-11-17: qty 1

## 2015-11-17 NOTE — ED Notes (Addendum)
Swelling an redness noted to right great toe.  Pt notice ulcer today and notice the swelling on yesterday.  Denies pain but feeling some throbbing.  Pt says right toe has been broken several times in last year and do not have much feeling in right foot since back surgery one year ago.  Edema noted to right ankle and foot.  Ulcer noted to right great toe.

## 2015-11-17 NOTE — ED Provider Notes (Signed)
CSN: [id number]     Arrival date & time 11/17/15  1735 History   First MD Initiated Contact with Patient 11/17/15 1814     Chief Complaint  Patient presents with  . Foot Swelling  . Wound Infection     (Consider location/radiation/quality/duration/timing/severity/associated sxs/prior Treatment) Patient is a 60 y.o. female presenting with rash. The history is provided by the patient (Patient complains of swelling and redness to her right foot.).  Rash Location: Right foot. Quality: not blistering   Severity:  Mild Onset quality:  Sudden Timing:  Constant Progression:  Waxing and waning Chronicity:  New Context: not animal contact   Associated symptoms: no abdominal pain, no diarrhea, no fatigue and no headaches     Past Medical History  Diagnosis Date  . Type 2 diabetes mellitus (HCC)   . Hypertension   . Microproteinuria   . Noncompliance   . Osteopenia   . Neuropathy [facility](HCC)    Past Surgical History  Procedure Laterality Date  . Abdominal hysterectomy    . Back surgery     Family History  Problem Relation Age of Onset  . Diabetes Mother    Social History  Substance Use Topics  . Smoking status: Never Smoker   . Smokeless tobacco: None  . Alcohol Use: No   OB History    No data available     Review of Systems  Constitutional: Negative for appetite change and fatigue.  HENT: Negative for congestion, ear discharge and sinus pressure.   Eyes: Negative for discharge.  Respiratory: Negative for cough.   Cardiovascular: Negative for chest pain.  Gastrointestinal: Negative for abdominal pain and diarrhea.  Genitourinary: Negative for frequency and hematuria.  Musculoskeletal: Negative for back pain.       Patient has swelling and redness to right foot  Skin: Positive for rash.  Neurological: Negative for seizures and headaches.  Psychiatric/Behavioral: Negative for hallucinations.      Allergies  Review of patient's allergies indicates no known  allergies.  Home Medications   Prior to Admission medications   Medication Sig Start Date End Date Taking? Authorizing Provider  Cyanocobalamin (B-12 PO) Take 1 tablet by mouth daily.   Yes Historical Provider, MD  Insulin Glargine (LANTUS SOLOSTAR) 100 UNIT/ML Solostar Pen Inject 46 Units into the skin daily at 10 pm.   Yes Historical Provider, MD  losartan (COZAAR) 100 MG tablet Take 100 mg by mouth daily.   Yes Historical Provider, MD  metFORMIN (GLUCOPHAGE) 500 MG tablet Take 500 mg by mouth 4 (four) times daily.   Yes Historical Provider, MD  doxycycline (VIBRAMYCIN) 100 MG capsule Take 1 capsule (100 mg total) by mouth 2 (two) times daily. One po bid x 7 days 11/17/15   Bethann BerkshireJoseph Chadd Tollison, MD   BP 176/70 mmHg  Pulse 66  Temp(Src) 98.2 F (36.8 C) (Oral)  Resp 17  Ht 5\' 2"  (1.575 m)  Wt 158 lb (71.668 kg)  BMI 28.89 kg/m2  SpO2 98% Physical Exam  Constitutional: She is oriented to person, place, and time. She appears well-developed.  HENT:  Head: Normocephalic.  Eyes: Conjunctivae and EOM are normal. No scleral icterus.  Neck: Neck supple. No thyromegaly present.  Cardiovascular: Normal rate and regular rhythm.  Exam reveals no gallop and no friction rub.   No murmur heard. Pulmonary/Chest: No stridor. She has no wheezes. She has no rales. She exhibits no tenderness.  Abdominal: She exhibits no distension. There is no tenderness. There is no rebound.  Musculoskeletal: Normal  range of motion. She exhibits edema.  Patient has mild deformity to right large toe from old fracture.  Redness around foot and toe. Decreased sensation around foot. Pulses normal  Lymphadenopathy:    She has no cervical adenopathy.  Neurological: She is oriented to person, place, and time. She exhibits normal muscle tone. Coordination normal.  Skin: No rash noted. No erythema.  Psychiatric: She has a normal mood and affect. Her behavior is normal.    ED Course  Procedures (including critical care  time) Labs Review Labs Reviewed  CBC WITH DIFFERENTIAL/PLATELET - Abnormal; Notable for the following:    WBC 10.6 (*)    All other components within normal limits  COMPREHENSIVE METABOLIC PANEL - Abnormal; Notable for the following:    Glucose, Bld 113 (*)    All other components within normal limits    Imaging Review Dg Foot Complete Right  11/17/2015  CLINICAL DATA:  Right foot redness and swelling for 2 days, great toe ulcer, no known injury EXAM: RIGHT FOOT COMPLETE - 3+ VIEW COMPARISON:  03/11/2015 FINDINGS: Three views of the right foot submitted. There is a corner fracture at medial base of distal phalanx great toe. Clinical correlation is necessary to exclude recurrent fracture or nonunion of previous fracture. There is diffuse soft tissue swelling great toe. IMPRESSION: There is a corner fracture at medial base of distal phalanx great toe. Clinical correlation is necessary to exclude recurrent fracture or nonunion of previous fracture. There is diffuse soft tissue swelling great toe. Electronically Signed   By: Natasha Mead M.D.   On: 11/17/2015 18:43   I have personally reviewed and evaluated these images and lab results as part of my medical decision-making.   EKG Interpretation None      MDM   Final diagnoses:  Foot infection   Labs unremarkable. X-ray shows old fracture. Possible cellulitis to foot. Secondary exam noticed the redness improved while in the emergency department. Patient will be put on doxycycline and told to follow-up the beginning of the week return to the weekend if getting much worse    Bethann Berkshire, MD 11/17/15 2043

## 2015-11-17 NOTE — ED Notes (Signed)
Crackers, peanut butter, and water provided per pt request

## 2015-11-17 NOTE — ED Notes (Signed)
Pt reports RT foot/ankle edema, redness, and warmth. Pt also found a wound on RT big toe. Pt is a diabetic.

## 2015-11-17 NOTE — Discharge Instructions (Signed)
Keep foot elevated and follow-up with your family doctor next week. [facility]

## 2015-11-17 NOTE — ED Notes (Signed)
MD at bedside. 

## 2015-11-22 DIAGNOSIS — L719 Rosacea, unspecified: Secondary | ICD-10-CM | POA: Diagnosis not present

## 2015-11-22 DIAGNOSIS — E1140 Type 2 diabetes mellitus with diabetic neuropathy, unspecified: Secondary | ICD-10-CM | POA: Diagnosis not present

## 2015-11-22 DIAGNOSIS — E663 Overweight: Secondary | ICD-10-CM | POA: Diagnosis not present

## 2015-11-22 DIAGNOSIS — E559 Vitamin D deficiency, unspecified: Secondary | ICD-10-CM | POA: Diagnosis not present

## 2015-11-24 ENCOUNTER — Other Ambulatory Visit: Payer: Self-pay | Admitting: Internal Medicine

## 2015-11-24 DIAGNOSIS — Z794 Long term (current) use of insulin: Secondary | ICD-10-CM | POA: Diagnosis not present

## 2015-11-24 DIAGNOSIS — L97509 Non-pressure chronic ulcer of other part of unspecified foot with unspecified severity: Principal | ICD-10-CM

## 2015-11-24 DIAGNOSIS — E11621 Type 2 diabetes mellitus with foot ulcer: Secondary | ICD-10-CM | POA: Diagnosis not present

## 2015-11-24 DIAGNOSIS — E1165 Type 2 diabetes mellitus with hyperglycemia: Secondary | ICD-10-CM | POA: Diagnosis not present

## 2015-11-24 DIAGNOSIS — E1142 Type 2 diabetes mellitus with diabetic polyneuropathy: Secondary | ICD-10-CM | POA: Diagnosis not present

## 2015-11-30 ENCOUNTER — Ambulatory Visit
Admission: RE | Admit: 2015-11-30 | Discharge: 2015-11-30 | Disposition: A | Discharge status: Home / Self Care | Payer: BLUE CROSS/BLUE SHIELD | Source: Ambulatory Visit | Attending: Internal Medicine | Admitting: Internal Medicine

## 2015-11-30 DIAGNOSIS — L97519 Non-pressure chronic ulcer of other part of right foot with unspecified severity: Secondary | ICD-10-CM | POA: Diagnosis not present

## 2015-11-30 DIAGNOSIS — E11621 Type 2 diabetes mellitus with foot ulcer: Secondary | ICD-10-CM

## 2015-11-30 DIAGNOSIS — L97509 Non-pressure chronic ulcer of other part of unspecified foot with unspecified severity: Principal | ICD-10-CM

## 2015-12-27 DIAGNOSIS — E11621 Type 2 diabetes mellitus with foot ulcer: Secondary | ICD-10-CM | POA: Diagnosis not present

## 2015-12-27 DIAGNOSIS — E1142 Type 2 diabetes mellitus with diabetic polyneuropathy: Secondary | ICD-10-CM | POA: Diagnosis not present

## 2015-12-27 DIAGNOSIS — E1165 Type 2 diabetes mellitus with hyperglycemia: Secondary | ICD-10-CM | POA: Diagnosis not present

## 2015-12-27 DIAGNOSIS — Z794 Long term (current) use of insulin: Secondary | ICD-10-CM | POA: Diagnosis not present

## 2015-12-27 DIAGNOSIS — E538 Deficiency of other specified B group vitamins: Secondary | ICD-10-CM | POA: Diagnosis not present

## 2016-01-05 DIAGNOSIS — E1165 Type 2 diabetes mellitus with hyperglycemia: Secondary | ICD-10-CM | POA: Diagnosis not present

## 2016-01-05 DIAGNOSIS — E11621 Type 2 diabetes mellitus with foot ulcer: Secondary | ICD-10-CM | POA: Diagnosis not present

## 2016-01-05 DIAGNOSIS — E1142 Type 2 diabetes mellitus with diabetic polyneuropathy: Secondary | ICD-10-CM | POA: Diagnosis not present

## 2016-01-05 DIAGNOSIS — E538 Deficiency of other specified B group vitamins: Secondary | ICD-10-CM | POA: Diagnosis not present

## 2016-01-05 DIAGNOSIS — Z794 Long term (current) use of insulin: Secondary | ICD-10-CM | POA: Diagnosis not present

## 2016-01-17 ENCOUNTER — Other Ambulatory Visit: Payer: Self-pay | Admitting: Surgery

## 2016-01-17 ENCOUNTER — Encounter (HOSPITAL_BASED_OUTPATIENT_CLINIC_OR_DEPARTMENT_OTHER): Payer: BLUE CROSS/BLUE SHIELD | Attending: Surgery

## 2016-01-17 ENCOUNTER — Ambulatory Visit (HOSPITAL_COMMUNITY)
Admission: RE | Admit: 2016-01-17 | Discharge: 2016-01-17 | Disposition: A | Discharge status: Home / Self Care | Payer: BLUE CROSS/BLUE SHIELD | Source: Ambulatory Visit | Attending: Surgery | Admitting: Surgery

## 2016-01-17 DIAGNOSIS — E11319 Type 2 diabetes mellitus with unspecified diabetic retinopathy without macular edema: Secondary | ICD-10-CM | POA: Diagnosis not present

## 2016-01-17 DIAGNOSIS — L97511 Non-pressure chronic ulcer of other part of right foot limited to breakdown of skin: Secondary | ICD-10-CM | POA: Insufficient documentation

## 2016-01-17 DIAGNOSIS — Z79899 Other long term (current) drug therapy: Secondary | ICD-10-CM | POA: Insufficient documentation

## 2016-01-17 DIAGNOSIS — E1140 Type 2 diabetes mellitus with diabetic neuropathy, unspecified: Secondary | ICD-10-CM | POA: Insufficient documentation

## 2016-01-17 DIAGNOSIS — E11621 Type 2 diabetes mellitus with foot ulcer: Secondary | ICD-10-CM | POA: Insufficient documentation

## 2016-01-17 DIAGNOSIS — M86671 Other chronic osteomyelitis, right ankle and foot: Secondary | ICD-10-CM | POA: Diagnosis not present

## 2016-01-17 DIAGNOSIS — Z794 Long term (current) use of insulin: Secondary | ICD-10-CM | POA: Insufficient documentation

## 2016-01-17 DIAGNOSIS — R938 Abnormal findings on diagnostic imaging of other specified body structures: Secondary | ICD-10-CM | POA: Insufficient documentation

## 2016-01-17 DIAGNOSIS — E1165 Type 2 diabetes mellitus with hyperglycemia: Secondary | ICD-10-CM | POA: Diagnosis not present

## 2016-01-17 DIAGNOSIS — M869 Osteomyelitis, unspecified: Secondary | ICD-10-CM

## 2016-01-17 DIAGNOSIS — M25474 Effusion, right foot: Secondary | ICD-10-CM | POA: Insufficient documentation

## 2016-01-17 DIAGNOSIS — I10 Essential (primary) hypertension: Secondary | ICD-10-CM | POA: Insufficient documentation

## 2016-01-17 DIAGNOSIS — E1142 Type 2 diabetes mellitus with diabetic polyneuropathy: Secondary | ICD-10-CM | POA: Diagnosis not present

## 2016-01-19 ENCOUNTER — Other Ambulatory Visit: Payer: Self-pay | Admitting: Surgery

## 2016-01-19 DIAGNOSIS — E11621 Type 2 diabetes mellitus with foot ulcer: Secondary | ICD-10-CM

## 2016-01-19 DIAGNOSIS — L97519 Non-pressure chronic ulcer of other part of right foot with unspecified severity: Principal | ICD-10-CM

## 2016-01-19 DIAGNOSIS — Z794 Long term (current) use of insulin: Secondary | ICD-10-CM | POA: Diagnosis not present

## 2016-01-19 DIAGNOSIS — E1165 Type 2 diabetes mellitus with hyperglycemia: Secondary | ICD-10-CM | POA: Diagnosis not present

## 2016-01-19 DIAGNOSIS — E538 Deficiency of other specified B group vitamins: Secondary | ICD-10-CM | POA: Diagnosis not present

## 2016-01-19 DIAGNOSIS — E1142 Type 2 diabetes mellitus with diabetic polyneuropathy: Secondary | ICD-10-CM | POA: Diagnosis not present

## 2016-01-24 DIAGNOSIS — E11621 Type 2 diabetes mellitus with foot ulcer: Secondary | ICD-10-CM | POA: Diagnosis not present

## 2016-01-24 DIAGNOSIS — Z794 Long term (current) use of insulin: Secondary | ICD-10-CM | POA: Diagnosis not present

## 2016-01-24 DIAGNOSIS — E1140 Type 2 diabetes mellitus with diabetic neuropathy, unspecified: Secondary | ICD-10-CM | POA: Diagnosis not present

## 2016-01-24 DIAGNOSIS — L97511 Non-pressure chronic ulcer of other part of right foot limited to breakdown of skin: Secondary | ICD-10-CM | POA: Diagnosis not present

## 2016-01-24 DIAGNOSIS — E1142 Type 2 diabetes mellitus with diabetic polyneuropathy: Secondary | ICD-10-CM | POA: Diagnosis not present

## 2016-01-24 DIAGNOSIS — Z79899 Other long term (current) drug therapy: Secondary | ICD-10-CM | POA: Diagnosis not present

## 2016-01-24 DIAGNOSIS — E11319 Type 2 diabetes mellitus with unspecified diabetic retinopathy without macular edema: Secondary | ICD-10-CM | POA: Diagnosis not present

## 2016-01-24 DIAGNOSIS — M86671 Other chronic osteomyelitis, right ankle and foot: Secondary | ICD-10-CM | POA: Diagnosis not present

## 2016-01-24 DIAGNOSIS — I10 Essential (primary) hypertension: Secondary | ICD-10-CM | POA: Diagnosis not present

## 2016-01-24 DIAGNOSIS — Z1231 Encounter for screening mammogram for malignant neoplasm of breast: Secondary | ICD-10-CM | POA: Diagnosis not present

## 2016-01-25 ENCOUNTER — Ambulatory Visit (HOSPITAL_COMMUNITY)
Admission: RE | Admit: 2016-01-25 | Discharge: 2016-01-25 | Disposition: A | Discharge status: Home / Self Care | Payer: BLUE CROSS/BLUE SHIELD | Source: Ambulatory Visit | Attending: Surgery | Admitting: Surgery

## 2016-01-25 ENCOUNTER — Encounter (HOSPITAL_BASED_OUTPATIENT_CLINIC_OR_DEPARTMENT_OTHER): Payer: BLUE CROSS/BLUE SHIELD

## 2016-01-25 DIAGNOSIS — L97519 Non-pressure chronic ulcer of other part of right foot with unspecified severity: Principal | ICD-10-CM

## 2016-01-25 DIAGNOSIS — E11621 Type 2 diabetes mellitus with foot ulcer: Secondary | ICD-10-CM

## 2016-01-27 ENCOUNTER — Ambulatory Visit (HOSPITAL_COMMUNITY)
Admission: RE | Admit: 2016-01-27 | Discharge: 2016-01-27 | Disposition: A | Discharge status: Home / Self Care | Payer: BLUE CROSS/BLUE SHIELD | Source: Ambulatory Visit | Attending: Surgery | Admitting: Surgery

## 2016-01-27 DIAGNOSIS — R600 Localized edema: Secondary | ICD-10-CM | POA: Diagnosis not present

## 2016-01-27 DIAGNOSIS — L97519 Non-pressure chronic ulcer of other part of right foot with unspecified severity: Secondary | ICD-10-CM | POA: Diagnosis not present

## 2016-01-27 DIAGNOSIS — E11621 Type 2 diabetes mellitus with foot ulcer: Secondary | ICD-10-CM | POA: Insufficient documentation

## 2016-01-27 DIAGNOSIS — L97511 Non-pressure chronic ulcer of other part of right foot limited to breakdown of skin: Secondary | ICD-10-CM | POA: Diagnosis not present

## 2016-01-27 LAB — POCT I-STAT CREATININE: Creatinine, Ser: 0.6 mg/dL (ref 0.44–1.00)

## 2016-01-27 MED ORDER — GADOBENATE DIMEGLUMINE 529 MG/ML IV SOLN
15.0000 mL | Freq: Once | INTRAVENOUS | Status: AC | PRN
  Administered 2016-01-27: 15 mL via INTRAVENOUS

## 2016-01-31 ENCOUNTER — Other Ambulatory Visit (HOSPITAL_COMMUNITY): Payer: Self-pay | Admitting: Surgery

## 2016-01-31 DIAGNOSIS — I10 Essential (primary) hypertension: Secondary | ICD-10-CM | POA: Diagnosis not present

## 2016-01-31 DIAGNOSIS — M86371 Chronic multifocal osteomyelitis, right ankle and foot: Secondary | ICD-10-CM | POA: Diagnosis not present

## 2016-01-31 DIAGNOSIS — M86671 Other chronic osteomyelitis, right ankle and foot: Secondary | ICD-10-CM | POA: Diagnosis not present

## 2016-01-31 DIAGNOSIS — L97511 Non-pressure chronic ulcer of other part of right foot limited to breakdown of skin: Secondary | ICD-10-CM | POA: Diagnosis not present

## 2016-01-31 DIAGNOSIS — Z5189 Encounter for other specified aftercare: Secondary | ICD-10-CM

## 2016-01-31 DIAGNOSIS — E11621 Type 2 diabetes mellitus with foot ulcer: Secondary | ICD-10-CM | POA: Diagnosis not present

## 2016-01-31 DIAGNOSIS — Z79899 Other long term (current) drug therapy: Secondary | ICD-10-CM | POA: Diagnosis not present

## 2016-01-31 DIAGNOSIS — Z794 Long term (current) use of insulin: Secondary | ICD-10-CM | POA: Diagnosis not present

## 2016-01-31 DIAGNOSIS — E1140 Type 2 diabetes mellitus with diabetic neuropathy, unspecified: Secondary | ICD-10-CM | POA: Diagnosis not present

## 2016-01-31 DIAGNOSIS — E11319 Type 2 diabetes mellitus with unspecified diabetic retinopathy without macular edema: Secondary | ICD-10-CM | POA: Diagnosis not present

## 2016-02-01 DIAGNOSIS — E11621 Type 2 diabetes mellitus with foot ulcer: Secondary | ICD-10-CM | POA: Diagnosis not present

## 2016-02-01 DIAGNOSIS — E1142 Type 2 diabetes mellitus with diabetic polyneuropathy: Secondary | ICD-10-CM | POA: Diagnosis not present

## 2016-02-01 DIAGNOSIS — Z794 Long term (current) use of insulin: Secondary | ICD-10-CM | POA: Diagnosis not present

## 2016-02-01 DIAGNOSIS — E1165 Type 2 diabetes mellitus with hyperglycemia: Secondary | ICD-10-CM | POA: Diagnosis not present

## 2016-02-02 ENCOUNTER — Other Ambulatory Visit: Payer: Self-pay | Admitting: Surgery

## 2016-02-02 ENCOUNTER — Ambulatory Visit (HOSPITAL_COMMUNITY): Admission: RE | Admit: 2016-02-02 | Payer: BLUE CROSS/BLUE SHIELD | Source: Ambulatory Visit

## 2016-02-02 ENCOUNTER — Ambulatory Visit (HOSPITAL_COMMUNITY)
Admission: RE | Admit: 2016-02-02 | Discharge: 2016-02-02 | Disposition: A | Discharge status: Home / Self Care | Payer: BLUE CROSS/BLUE SHIELD | Source: Ambulatory Visit | Attending: Surgery | Admitting: Surgery

## 2016-02-02 ENCOUNTER — Other Ambulatory Visit: Payer: Self-pay

## 2016-02-02 DIAGNOSIS — E119 Type 2 diabetes mellitus without complications: Secondary | ICD-10-CM | POA: Insufficient documentation

## 2016-02-02 DIAGNOSIS — Z9289 Personal history of other medical treatment: Secondary | ICD-10-CM

## 2016-02-02 DIAGNOSIS — Z01818 Encounter for other preprocedural examination: Secondary | ICD-10-CM | POA: Diagnosis not present

## 2016-02-07 DIAGNOSIS — M86671 Other chronic osteomyelitis, right ankle and foot: Secondary | ICD-10-CM | POA: Diagnosis not present

## 2016-02-07 DIAGNOSIS — Z794 Long term (current) use of insulin: Secondary | ICD-10-CM | POA: Diagnosis not present

## 2016-02-07 DIAGNOSIS — E11621 Type 2 diabetes mellitus with foot ulcer: Secondary | ICD-10-CM | POA: Diagnosis not present

## 2016-02-07 DIAGNOSIS — E11319 Type 2 diabetes mellitus with unspecified diabetic retinopathy without macular edema: Secondary | ICD-10-CM | POA: Diagnosis not present

## 2016-02-07 DIAGNOSIS — I10 Essential (primary) hypertension: Secondary | ICD-10-CM | POA: Diagnosis not present

## 2016-02-07 DIAGNOSIS — L97511 Non-pressure chronic ulcer of other part of right foot limited to breakdown of skin: Secondary | ICD-10-CM | POA: Diagnosis not present

## 2016-02-07 DIAGNOSIS — Z79899 Other long term (current) drug therapy: Secondary | ICD-10-CM | POA: Diagnosis not present

## 2016-02-07 DIAGNOSIS — E1140 Type 2 diabetes mellitus with diabetic neuropathy, unspecified: Secondary | ICD-10-CM | POA: Diagnosis not present

## 2016-02-08 DIAGNOSIS — M86671 Other chronic osteomyelitis, right ankle and foot: Secondary | ICD-10-CM | POA: Diagnosis not present

## 2016-02-08 DIAGNOSIS — Z79899 Other long term (current) drug therapy: Secondary | ICD-10-CM | POA: Diagnosis not present

## 2016-02-08 DIAGNOSIS — I10 Essential (primary) hypertension: Secondary | ICD-10-CM | POA: Diagnosis not present

## 2016-02-08 DIAGNOSIS — Z794 Long term (current) use of insulin: Secondary | ICD-10-CM | POA: Diagnosis not present

## 2016-02-08 DIAGNOSIS — E1140 Type 2 diabetes mellitus with diabetic neuropathy, unspecified: Secondary | ICD-10-CM | POA: Diagnosis not present

## 2016-02-08 DIAGNOSIS — L97511 Non-pressure chronic ulcer of other part of right foot limited to breakdown of skin: Secondary | ICD-10-CM | POA: Diagnosis not present

## 2016-02-08 DIAGNOSIS — E11621 Type 2 diabetes mellitus with foot ulcer: Secondary | ICD-10-CM | POA: Diagnosis not present

## 2016-02-08 DIAGNOSIS — E11319 Type 2 diabetes mellitus with unspecified diabetic retinopathy without macular edema: Secondary | ICD-10-CM | POA: Diagnosis not present

## 2016-02-08 LAB — GLUCOSE, CAPILLARY
GLUCOSE-CAPILLARY: 164 mg/dL — AB (ref 65–99)
GLUCOSE-CAPILLARY: 93 mg/dL (ref 65–99)

## 2016-02-13 ENCOUNTER — Encounter (HOSPITAL_BASED_OUTPATIENT_CLINIC_OR_DEPARTMENT_OTHER): Payer: BLUE CROSS/BLUE SHIELD | Attending: Surgery

## 2016-02-13 DIAGNOSIS — M86671 Other chronic osteomyelitis, right ankle and foot: Secondary | ICD-10-CM | POA: Diagnosis not present

## 2016-02-13 DIAGNOSIS — I10 Essential (primary) hypertension: Secondary | ICD-10-CM | POA: Insufficient documentation

## 2016-02-13 DIAGNOSIS — E11319 Type 2 diabetes mellitus with unspecified diabetic retinopathy without macular edema: Secondary | ICD-10-CM | POA: Insufficient documentation

## 2016-02-13 DIAGNOSIS — E1142 Type 2 diabetes mellitus with diabetic polyneuropathy: Secondary | ICD-10-CM | POA: Insufficient documentation

## 2016-02-13 DIAGNOSIS — L97511 Non-pressure chronic ulcer of other part of right foot limited to breakdown of skin: Secondary | ICD-10-CM | POA: Diagnosis not present

## 2016-02-13 DIAGNOSIS — E1169 Type 2 diabetes mellitus with other specified complication: Secondary | ICD-10-CM | POA: Insufficient documentation

## 2016-02-13 DIAGNOSIS — Z794 Long term (current) use of insulin: Secondary | ICD-10-CM | POA: Diagnosis not present

## 2016-02-13 DIAGNOSIS — E11621 Type 2 diabetes mellitus with foot ulcer: Secondary | ICD-10-CM | POA: Insufficient documentation

## 2016-02-13 DIAGNOSIS — M86371 Chronic multifocal osteomyelitis, right ankle and foot: Secondary | ICD-10-CM | POA: Insufficient documentation

## 2016-02-13 LAB — GLUCOSE, CAPILLARY
Glucose-Capillary: 188 mg/dL — ABNORMAL HIGH (ref 65–99)
Glucose-Capillary: 130 mg/dL — ABNORMAL HIGH (ref 65–99)

## 2016-02-15 DIAGNOSIS — I10 Essential (primary) hypertension: Secondary | ICD-10-CM | POA: Diagnosis not present

## 2016-02-15 DIAGNOSIS — L97511 Non-pressure chronic ulcer of other part of right foot limited to breakdown of skin: Secondary | ICD-10-CM | POA: Diagnosis not present

## 2016-02-15 DIAGNOSIS — E1142 Type 2 diabetes mellitus with diabetic polyneuropathy: Secondary | ICD-10-CM | POA: Diagnosis not present

## 2016-02-15 DIAGNOSIS — E11319 Type 2 diabetes mellitus with unspecified diabetic retinopathy without macular edema: Secondary | ICD-10-CM | POA: Diagnosis not present

## 2016-02-15 DIAGNOSIS — E1169 Type 2 diabetes mellitus with other specified complication: Secondary | ICD-10-CM | POA: Diagnosis not present

## 2016-02-15 DIAGNOSIS — Z794 Long term (current) use of insulin: Secondary | ICD-10-CM | POA: Diagnosis not present

## 2016-02-15 DIAGNOSIS — M86371 Chronic multifocal osteomyelitis, right ankle and foot: Secondary | ICD-10-CM | POA: Diagnosis not present

## 2016-02-15 DIAGNOSIS — M86671 Other chronic osteomyelitis, right ankle and foot: Secondary | ICD-10-CM | POA: Diagnosis not present

## 2016-02-15 DIAGNOSIS — E11621 Type 2 diabetes mellitus with foot ulcer: Secondary | ICD-10-CM | POA: Diagnosis not present

## 2016-02-16 DIAGNOSIS — M86371 Chronic multifocal osteomyelitis, right ankle and foot: Secondary | ICD-10-CM | POA: Diagnosis not present

## 2016-02-16 DIAGNOSIS — L598 Other specified disorders of the skin and subcutaneous tissue related to radiation: Secondary | ICD-10-CM | POA: Diagnosis not present

## 2016-02-16 DIAGNOSIS — E1169 Type 2 diabetes mellitus with other specified complication: Secondary | ICD-10-CM | POA: Diagnosis not present

## 2016-02-16 DIAGNOSIS — I10 Essential (primary) hypertension: Secondary | ICD-10-CM | POA: Diagnosis not present

## 2016-02-16 DIAGNOSIS — Z794 Long term (current) use of insulin: Secondary | ICD-10-CM | POA: Diagnosis not present

## 2016-02-16 DIAGNOSIS — L97511 Non-pressure chronic ulcer of other part of right foot limited to breakdown of skin: Secondary | ICD-10-CM | POA: Diagnosis not present

## 2016-02-16 DIAGNOSIS — E1142 Type 2 diabetes mellitus with diabetic polyneuropathy: Secondary | ICD-10-CM | POA: Diagnosis not present

## 2016-02-16 DIAGNOSIS — E11319 Type 2 diabetes mellitus with unspecified diabetic retinopathy without macular edema: Secondary | ICD-10-CM | POA: Diagnosis not present

## 2016-02-16 DIAGNOSIS — E11621 Type 2 diabetes mellitus with foot ulcer: Secondary | ICD-10-CM | POA: Diagnosis not present

## 2016-02-16 LAB — GLUCOSE, CAPILLARY
GLUCOSE-CAPILLARY: 131 mg/dL — AB (ref 65–99)
GLUCOSE-CAPILLARY: 209 mg/dL — AB (ref 65–99)
Glucose-Capillary: 93 mg/dL (ref 65–99)
Glucose-Capillary: 130 mg/dL — ABNORMAL HIGH (ref 65–99)

## 2016-02-17 DIAGNOSIS — E11319 Type 2 diabetes mellitus with unspecified diabetic retinopathy without macular edema: Secondary | ICD-10-CM | POA: Diagnosis not present

## 2016-02-17 DIAGNOSIS — I10 Essential (primary) hypertension: Secondary | ICD-10-CM | POA: Diagnosis not present

## 2016-02-17 DIAGNOSIS — E538 Deficiency of other specified B group vitamins: Secondary | ICD-10-CM | POA: Diagnosis not present

## 2016-02-17 DIAGNOSIS — E11621 Type 2 diabetes mellitus with foot ulcer: Secondary | ICD-10-CM | POA: Diagnosis not present

## 2016-02-17 DIAGNOSIS — L97511 Non-pressure chronic ulcer of other part of right foot limited to breakdown of skin: Secondary | ICD-10-CM | POA: Diagnosis not present

## 2016-02-17 DIAGNOSIS — E1142 Type 2 diabetes mellitus with diabetic polyneuropathy: Secondary | ICD-10-CM | POA: Diagnosis not present

## 2016-02-17 DIAGNOSIS — Z794 Long term (current) use of insulin: Secondary | ICD-10-CM | POA: Diagnosis not present

## 2016-02-17 DIAGNOSIS — L598 Other specified disorders of the skin and subcutaneous tissue related to radiation: Secondary | ICD-10-CM | POA: Diagnosis not present

## 2016-02-17 DIAGNOSIS — M86371 Chronic multifocal osteomyelitis, right ankle and foot: Secondary | ICD-10-CM | POA: Diagnosis not present

## 2016-02-17 DIAGNOSIS — E1169 Type 2 diabetes mellitus with other specified complication: Secondary | ICD-10-CM | POA: Diagnosis not present

## 2016-02-17 LAB — GLUCOSE, CAPILLARY
Glucose-Capillary: 171 mg/dL — ABNORMAL HIGH (ref 65–99)
Glucose-Capillary: 116 mg/dL — ABNORMAL HIGH (ref 65–99)

## 2016-02-20 DIAGNOSIS — I10 Essential (primary) hypertension: Secondary | ICD-10-CM | POA: Diagnosis not present

## 2016-02-20 DIAGNOSIS — E11319 Type 2 diabetes mellitus with unspecified diabetic retinopathy without macular edema: Secondary | ICD-10-CM | POA: Diagnosis not present

## 2016-02-20 DIAGNOSIS — E11621 Type 2 diabetes mellitus with foot ulcer: Secondary | ICD-10-CM | POA: Diagnosis not present

## 2016-02-20 DIAGNOSIS — L598 Other specified disorders of the skin and subcutaneous tissue related to radiation: Secondary | ICD-10-CM | POA: Diagnosis not present

## 2016-02-20 DIAGNOSIS — M86371 Chronic multifocal osteomyelitis, right ankle and foot: Secondary | ICD-10-CM | POA: Diagnosis not present

## 2016-02-20 DIAGNOSIS — E1169 Type 2 diabetes mellitus with other specified complication: Secondary | ICD-10-CM | POA: Diagnosis not present

## 2016-02-20 DIAGNOSIS — E1142 Type 2 diabetes mellitus with diabetic polyneuropathy: Secondary | ICD-10-CM | POA: Diagnosis not present

## 2016-02-20 DIAGNOSIS — Z794 Long term (current) use of insulin: Secondary | ICD-10-CM | POA: Diagnosis not present

## 2016-02-20 DIAGNOSIS — L97511 Non-pressure chronic ulcer of other part of right foot limited to breakdown of skin: Secondary | ICD-10-CM | POA: Diagnosis not present

## 2016-02-20 LAB — GLUCOSE, CAPILLARY
GLUCOSE-CAPILLARY: 234 mg/dL — AB (ref 65–99)
Glucose-Capillary: 221 mg/dL — ABNORMAL HIGH (ref 65–99)

## 2016-02-21 DIAGNOSIS — E1169 Type 2 diabetes mellitus with other specified complication: Secondary | ICD-10-CM | POA: Diagnosis not present

## 2016-02-21 DIAGNOSIS — E11621 Type 2 diabetes mellitus with foot ulcer: Secondary | ICD-10-CM | POA: Diagnosis not present

## 2016-02-21 DIAGNOSIS — M86371 Chronic multifocal osteomyelitis, right ankle and foot: Secondary | ICD-10-CM | POA: Diagnosis not present

## 2016-02-21 DIAGNOSIS — E1142 Type 2 diabetes mellitus with diabetic polyneuropathy: Secondary | ICD-10-CM | POA: Diagnosis not present

## 2016-02-21 DIAGNOSIS — E11319 Type 2 diabetes mellitus with unspecified diabetic retinopathy without macular edema: Secondary | ICD-10-CM | POA: Diagnosis not present

## 2016-02-21 DIAGNOSIS — I10 Essential (primary) hypertension: Secondary | ICD-10-CM | POA: Diagnosis not present

## 2016-02-21 DIAGNOSIS — Z794 Long term (current) use of insulin: Secondary | ICD-10-CM | POA: Diagnosis not present

## 2016-02-21 DIAGNOSIS — L598 Other specified disorders of the skin and subcutaneous tissue related to radiation: Secondary | ICD-10-CM | POA: Diagnosis not present

## 2016-02-21 DIAGNOSIS — L97511 Non-pressure chronic ulcer of other part of right foot limited to breakdown of skin: Secondary | ICD-10-CM | POA: Diagnosis not present

## 2016-02-21 LAB — GLUCOSE, CAPILLARY
GLUCOSE-CAPILLARY: 90 mg/dL (ref 65–99)
Glucose-Capillary: 140 mg/dL — ABNORMAL HIGH (ref 65–99)

## 2016-02-22 DIAGNOSIS — L97511 Non-pressure chronic ulcer of other part of right foot limited to breakdown of skin: Secondary | ICD-10-CM | POA: Diagnosis not present

## 2016-02-22 DIAGNOSIS — E1169 Type 2 diabetes mellitus with other specified complication: Secondary | ICD-10-CM | POA: Diagnosis not present

## 2016-02-22 DIAGNOSIS — M86371 Chronic multifocal osteomyelitis, right ankle and foot: Secondary | ICD-10-CM | POA: Diagnosis not present

## 2016-02-22 DIAGNOSIS — E11621 Type 2 diabetes mellitus with foot ulcer: Secondary | ICD-10-CM | POA: Diagnosis not present

## 2016-02-22 DIAGNOSIS — E1142 Type 2 diabetes mellitus with diabetic polyneuropathy: Secondary | ICD-10-CM | POA: Diagnosis not present

## 2016-02-22 DIAGNOSIS — Z794 Long term (current) use of insulin: Secondary | ICD-10-CM | POA: Diagnosis not present

## 2016-02-22 DIAGNOSIS — L598 Other specified disorders of the skin and subcutaneous tissue related to radiation: Secondary | ICD-10-CM | POA: Diagnosis not present

## 2016-02-22 DIAGNOSIS — I10 Essential (primary) hypertension: Secondary | ICD-10-CM | POA: Diagnosis not present

## 2016-02-22 DIAGNOSIS — E11319 Type 2 diabetes mellitus with unspecified diabetic retinopathy without macular edema: Secondary | ICD-10-CM | POA: Diagnosis not present

## 2016-02-23 DIAGNOSIS — L598 Other specified disorders of the skin and subcutaneous tissue related to radiation: Secondary | ICD-10-CM | POA: Diagnosis not present

## 2016-02-23 DIAGNOSIS — E11621 Type 2 diabetes mellitus with foot ulcer: Secondary | ICD-10-CM | POA: Diagnosis not present

## 2016-02-23 DIAGNOSIS — E1142 Type 2 diabetes mellitus with diabetic polyneuropathy: Secondary | ICD-10-CM | POA: Diagnosis not present

## 2016-02-23 DIAGNOSIS — L97511 Non-pressure chronic ulcer of other part of right foot limited to breakdown of skin: Secondary | ICD-10-CM | POA: Diagnosis not present

## 2016-02-23 DIAGNOSIS — E11319 Type 2 diabetes mellitus with unspecified diabetic retinopathy without macular edema: Secondary | ICD-10-CM | POA: Diagnosis not present

## 2016-02-23 DIAGNOSIS — Z794 Long term (current) use of insulin: Secondary | ICD-10-CM | POA: Diagnosis not present

## 2016-02-23 DIAGNOSIS — M86371 Chronic multifocal osteomyelitis, right ankle and foot: Secondary | ICD-10-CM | POA: Diagnosis not present

## 2016-02-23 DIAGNOSIS — I10 Essential (primary) hypertension: Secondary | ICD-10-CM | POA: Diagnosis not present

## 2016-02-23 DIAGNOSIS — E1169 Type 2 diabetes mellitus with other specified complication: Secondary | ICD-10-CM | POA: Diagnosis not present

## 2016-02-23 LAB — GLUCOSE, CAPILLARY
GLUCOSE-CAPILLARY: 204 mg/dL — AB (ref 65–99)
GLUCOSE-CAPILLARY: 152 mg/dL — AB (ref 65–99)
GLUCOSE-CAPILLARY: 169 mg/dL — AB (ref 65–99)
Glucose-Capillary: 191 mg/dL — ABNORMAL HIGH (ref 65–99)

## 2016-02-24 DIAGNOSIS — E1142 Type 2 diabetes mellitus with diabetic polyneuropathy: Secondary | ICD-10-CM | POA: Diagnosis not present

## 2016-02-24 DIAGNOSIS — Z794 Long term (current) use of insulin: Secondary | ICD-10-CM | POA: Diagnosis not present

## 2016-02-24 DIAGNOSIS — L97511 Non-pressure chronic ulcer of other part of right foot limited to breakdown of skin: Secondary | ICD-10-CM | POA: Diagnosis not present

## 2016-02-24 DIAGNOSIS — M86371 Chronic multifocal osteomyelitis, right ankle and foot: Secondary | ICD-10-CM | POA: Diagnosis not present

## 2016-02-24 DIAGNOSIS — E11319 Type 2 diabetes mellitus with unspecified diabetic retinopathy without macular edema: Secondary | ICD-10-CM | POA: Diagnosis not present

## 2016-02-24 DIAGNOSIS — L598 Other specified disorders of the skin and subcutaneous tissue related to radiation: Secondary | ICD-10-CM | POA: Diagnosis not present

## 2016-02-24 DIAGNOSIS — E1169 Type 2 diabetes mellitus with other specified complication: Secondary | ICD-10-CM | POA: Diagnosis not present

## 2016-02-24 DIAGNOSIS — I10 Essential (primary) hypertension: Secondary | ICD-10-CM | POA: Diagnosis not present

## 2016-02-24 DIAGNOSIS — E11621 Type 2 diabetes mellitus with foot ulcer: Secondary | ICD-10-CM | POA: Diagnosis not present

## 2016-02-24 LAB — GLUCOSE, CAPILLARY
Glucose-Capillary: 171 mg/dL — ABNORMAL HIGH (ref 65–99)
Glucose-Capillary: 103 mg/dL — ABNORMAL HIGH (ref 65–99)

## 2016-02-27 ENCOUNTER — Encounter: Payer: Self-pay | Admitting: Podiatry

## 2016-02-27 ENCOUNTER — Ambulatory Visit (INDEPENDENT_AMBULATORY_CARE_PROVIDER_SITE_OTHER): Payer: BLUE CROSS/BLUE SHIELD

## 2016-02-27 ENCOUNTER — Ambulatory Visit (INDEPENDENT_AMBULATORY_CARE_PROVIDER_SITE_OTHER): Payer: BLUE CROSS/BLUE SHIELD | Admitting: Podiatry

## 2016-02-27 DIAGNOSIS — L89891 Pressure ulcer of other site, stage 1: Secondary | ICD-10-CM

## 2016-02-27 DIAGNOSIS — L97514 Non-pressure chronic ulcer of other part of right foot with necrosis of bone: Secondary | ICD-10-CM

## 2016-02-27 DIAGNOSIS — R52 Pain, unspecified: Secondary | ICD-10-CM

## 2016-02-27 DIAGNOSIS — M86171 Other acute osteomyelitis, right ankle and foot: Secondary | ICD-10-CM

## 2016-02-27 LAB — BASIC METABOLIC PANEL
BUN: 16 mg/dL (ref 7–25)
CHLORIDE: 105 mmol/L (ref 98–110)
CO2: 25 mmol/L (ref 20–31)
Calcium: 9.1 mg/dL (ref 8.6–10.4)
Creat: 0.83 mg/dL (ref 0.50–0.99)
GLUCOSE: 119 mg/dL — AB (ref 65–99)
POTASSIUM: 4.6 mmol/L (ref 3.5–5.3)
Sodium: 140 mmol/L (ref 135–146)

## 2016-02-27 LAB — CBC WITH DIFFERENTIAL/PLATELET
BASOS ABS: 72 {cells}/uL (ref 0–200)
BASOS PCT: 1 %
EOS PCT: 3 %
Eosinophils Absolute: 216 cells/uL (ref 15–500)
HCT: 37.2 % (ref 35.0–45.0)
HEMOGLOBIN: 12.2 g/dL (ref 11.7–15.5)
LYMPHS ABS: 1944 {cells}/uL (ref 850–3900)
Lymphocytes Relative: 27 %
MCH: 26.5 pg — AB (ref 27.0–33.0)
MCHC: 32.8 g/dL (ref 32.0–36.0)
MCV: 80.7 fL (ref 80.0–100.0)
MONOS PCT: 8 %
MPV: 9.7 fL (ref 7.5–12.5)
Monocytes Absolute: 576 cells/uL (ref 200–950)
NEUTROS ABS: 4392 {cells}/uL (ref 1500–7800)
Neutrophils Relative %: 61 %
PLATELETS: 261 10*3/uL (ref 140–400)
RBC: 4.61 MIL/uL (ref 3.80–5.10)
RDW: 16.1 % — ABNORMAL HIGH (ref 11.0–15.0)
WBC: 7.2 10*3/uL (ref 3.8–10.8)

## 2016-02-27 LAB — C-REACTIVE PROTEIN: CRP: <0.5 mg/dL (ref <0.60)

## 2016-02-27 LAB — HEMOGLOBIN A1C
HEMOGLOBIN A1C: 7.7 % — AB (ref <5.7)
MEAN PLASMA GLUCOSE: 174 mg/dL

## 2016-02-27 NOTE — Patient Instructions (Signed)

## 2016-02-27 NOTE — Progress Notes (Signed)
   Subjective:    Patient ID: [name] [name], female    DOB: 07/31/1956, 60 y.o.   MRN: [id number]  HPI  60 year old female presents the office today for concerns of a wound to the right big toe. She was seen by the wound care center and she was referred to me for possible bone biopsy. She's had a wound present since April 6 of this year. She appears a has broken this to she states back in 2016. She was on antibiotics for 10 days when the wound was first identified in April but she has not been on any antibiotics since. She has been going the wound care center for local wound care and she is currently getting hyperbaric oxygen therapy. She denies any drainage or pus to the wound. There is to be some redness and swelling to the tip of the toe but she feels that overall it is improving. No other complaints this time.   Review of Systems  All other systems reviewed and are negative.      Objective:   Physical Exam General: AAO x3, NAD  Dermatological: Ulcerations present on the distal aspect of the right hallux which does probe to bone. The wound today does measure partly 0.5 x 0.5 after debridement of the hyperkeratotic periwound. There is no fluctuance or crepitus. There is no malodor. There is no undermining or tunneling. The does reveal edema to the right hallux and localized erythema to the distal aspect. There is no other open lesions identified.  Vascular: Dorsalis Pedis artery and Posterior Tibial artery pedal pulses are palpable with immedate capillary fill time. Pedal hair growth present.  There is no pain with calf compression, swelling, warmth, erythema.   Neruologic: Sensation decreased Simms Weinstein monofilament, decreased vibratory sensation.  Musculoskeletal: Hallux malleus is present on the right hallux. No other areas of tenderness bilaterally.  Gait: Unassisted, Nonantalgic.      Assessment & Plan:  60 year old female right hallux ulceration with possible  osteomyelitis -Treatment options discussed including all alternatives, risks, and complications -Etiology of symptoms were discussed -Previous MRI, x-rays as well as arterial studies were reviewed with and discussed the patient. Possible osteomyelitis discussed with her limb salvage versus amputation. At times her to proceed with times a per to if possible. At this time I recommended a bone biopsy of the hallux to identify for possible osteomyelitis and debridement of the wound. She wishes to proceed with this. -The incision placement as well as the postoperative course was discussed with the patient. I discussed risks of the surgery which include, but not limited to, infection, bleeding, pain, swelling, need for further surgery, delayed or nonhealing, painful or ugly scar, numbness or sensation changes, over/under correction, recurrence, transfer lesions, further deformity, hardware failure, DVT/PE, loss of toe/foot. Patient understands these risks and wishes to proceed with surgery. The surgical consent was reviewed with the patient all 3 pages were signed. No promises or guarantees were given to the outcome of the procedure. All questions were answered to the best of my ability. Before the surgery the patient was encouraged to call the office if there is any further questions. [facility] basis.  Ovid CurdMatthew Wagoner, DPM

## 2016-02-28 DIAGNOSIS — M86371 Chronic multifocal osteomyelitis, right ankle and foot: Secondary | ICD-10-CM | POA: Diagnosis not present

## 2016-02-28 DIAGNOSIS — E1142 Type 2 diabetes mellitus with diabetic polyneuropathy: Secondary | ICD-10-CM | POA: Diagnosis not present

## 2016-02-28 DIAGNOSIS — E11621 Type 2 diabetes mellitus with foot ulcer: Secondary | ICD-10-CM | POA: Diagnosis not present

## 2016-02-28 DIAGNOSIS — L97511 Non-pressure chronic ulcer of other part of right foot limited to breakdown of skin: Secondary | ICD-10-CM | POA: Diagnosis not present

## 2016-02-28 DIAGNOSIS — E11319 Type 2 diabetes mellitus with unspecified diabetic retinopathy without macular edema: Secondary | ICD-10-CM | POA: Diagnosis not present

## 2016-02-28 DIAGNOSIS — Z794 Long term (current) use of insulin: Secondary | ICD-10-CM | POA: Diagnosis not present

## 2016-02-28 DIAGNOSIS — E1169 Type 2 diabetes mellitus with other specified complication: Secondary | ICD-10-CM | POA: Diagnosis not present

## 2016-02-28 DIAGNOSIS — I10 Essential (primary) hypertension: Secondary | ICD-10-CM | POA: Diagnosis not present

## 2016-02-28 LAB — GLUCOSE, CAPILLARY: GLUCOSE-CAPILLARY: 243 mg/dL — AB (ref 65–99)

## 2016-02-28 LAB — SEDIMENTATION RATE: Sed Rate: 5 mm/hr (ref 0–30)

## 2016-02-29 ENCOUNTER — Encounter: Payer: Self-pay | Admitting: Podiatry

## 2016-02-29 ENCOUNTER — Telehealth: Payer: Self-pay | Admitting: *Deleted

## 2016-02-29 DIAGNOSIS — M86271 Subacute osteomyelitis, right ankle and foot: Secondary | ICD-10-CM | POA: Diagnosis not present

## 2016-02-29 DIAGNOSIS — I10 Essential (primary) hypertension: Secondary | ICD-10-CM | POA: Diagnosis not present

## 2016-02-29 DIAGNOSIS — M86171 Other acute osteomyelitis, right ankle and foot: Secondary | ICD-10-CM | POA: Diagnosis not present

## 2016-02-29 DIAGNOSIS — M86679 Other chronic osteomyelitis, unspecified ankle and foot: Secondary | ICD-10-CM | POA: Diagnosis not present

## 2016-02-29 DIAGNOSIS — M898X7 Other specified disorders of bone, ankle and foot: Secondary | ICD-10-CM | POA: Diagnosis not present

## 2016-02-29 DIAGNOSIS — L97514 Non-pressure chronic ulcer of other part of right foot with necrosis of bone: Secondary | ICD-10-CM | POA: Diagnosis not present

## 2016-02-29 LAB — GLUCOSE, CAPILLARY: Glucose-Capillary: 165 mg/dL — ABNORMAL HIGH (ref 65–99)

## 2016-02-29 NOTE — Telephone Encounter (Addendum)
[facility] states there is an question concerning pt's rx.  I spoke with [facility], she states no longer make Vicodin 5/325mg , need to change to Norco.  I okayed change. 03/02/2016-pt states she had a bone biopsy this week and wanted to know when she could drive and shower.  I left message on pt's cellphone [phone] not to drive or get the surgery foot wet, to ask Dr. Ardelle AntonWagoner at her POV appt.

## 2016-03-01 DIAGNOSIS — I10 Essential (primary) hypertension: Secondary | ICD-10-CM | POA: Diagnosis not present

## 2016-03-01 DIAGNOSIS — M86371 Chronic multifocal osteomyelitis, right ankle and foot: Secondary | ICD-10-CM | POA: Diagnosis not present

## 2016-03-01 DIAGNOSIS — L97511 Non-pressure chronic ulcer of other part of right foot limited to breakdown of skin: Secondary | ICD-10-CM | POA: Diagnosis not present

## 2016-03-01 DIAGNOSIS — L598 Other specified disorders of the skin and subcutaneous tissue related to radiation: Secondary | ICD-10-CM | POA: Diagnosis not present

## 2016-03-01 DIAGNOSIS — E11621 Type 2 diabetes mellitus with foot ulcer: Secondary | ICD-10-CM | POA: Diagnosis not present

## 2016-03-01 DIAGNOSIS — Z794 Long term (current) use of insulin: Secondary | ICD-10-CM | POA: Diagnosis not present

## 2016-03-01 DIAGNOSIS — E1142 Type 2 diabetes mellitus with diabetic polyneuropathy: Secondary | ICD-10-CM | POA: Diagnosis not present

## 2016-03-01 DIAGNOSIS — E1169 Type 2 diabetes mellitus with other specified complication: Secondary | ICD-10-CM | POA: Diagnosis not present

## 2016-03-01 DIAGNOSIS — E11319 Type 2 diabetes mellitus with unspecified diabetic retinopathy without macular edema: Secondary | ICD-10-CM | POA: Diagnosis not present

## 2016-03-01 LAB — GLUCOSE, CAPILLARY
Glucose-Capillary: 194 mg/dL — ABNORMAL HIGH (ref 65–99)
Glucose-Capillary: 119 mg/dL — ABNORMAL HIGH (ref 65–99)

## 2016-03-02 DIAGNOSIS — M86371 Chronic multifocal osteomyelitis, right ankle and foot: Secondary | ICD-10-CM | POA: Diagnosis not present

## 2016-03-02 DIAGNOSIS — Z794 Long term (current) use of insulin: Secondary | ICD-10-CM | POA: Diagnosis not present

## 2016-03-02 DIAGNOSIS — E11319 Type 2 diabetes mellitus with unspecified diabetic retinopathy without macular edema: Secondary | ICD-10-CM | POA: Diagnosis not present

## 2016-03-02 DIAGNOSIS — E11621 Type 2 diabetes mellitus with foot ulcer: Secondary | ICD-10-CM | POA: Diagnosis not present

## 2016-03-02 DIAGNOSIS — E1169 Type 2 diabetes mellitus with other specified complication: Secondary | ICD-10-CM | POA: Diagnosis not present

## 2016-03-02 DIAGNOSIS — E1142 Type 2 diabetes mellitus with diabetic polyneuropathy: Secondary | ICD-10-CM | POA: Diagnosis not present

## 2016-03-02 DIAGNOSIS — L598 Other specified disorders of the skin and subcutaneous tissue related to radiation: Secondary | ICD-10-CM | POA: Diagnosis not present

## 2016-03-02 DIAGNOSIS — I10 Essential (primary) hypertension: Secondary | ICD-10-CM | POA: Diagnosis not present

## 2016-03-02 DIAGNOSIS — L97511 Non-pressure chronic ulcer of other part of right foot limited to breakdown of skin: Secondary | ICD-10-CM | POA: Diagnosis not present

## 2016-03-02 LAB — GLUCOSE, CAPILLARY
Glucose-Capillary: 195 mg/dL — ABNORMAL HIGH (ref 65–99)
Glucose-Capillary: 119 mg/dL — ABNORMAL HIGH (ref 65–99)

## 2016-03-06 DIAGNOSIS — I10 Essential (primary) hypertension: Secondary | ICD-10-CM | POA: Diagnosis not present

## 2016-03-06 DIAGNOSIS — E11621 Type 2 diabetes mellitus with foot ulcer: Secondary | ICD-10-CM | POA: Diagnosis not present

## 2016-03-06 DIAGNOSIS — Z794 Long term (current) use of insulin: Secondary | ICD-10-CM | POA: Diagnosis not present

## 2016-03-06 DIAGNOSIS — M86671 Other chronic osteomyelitis, right ankle and foot: Secondary | ICD-10-CM | POA: Diagnosis not present

## 2016-03-06 DIAGNOSIS — L97511 Non-pressure chronic ulcer of other part of right foot limited to breakdown of skin: Secondary | ICD-10-CM | POA: Diagnosis not present

## 2016-03-06 DIAGNOSIS — E1142 Type 2 diabetes mellitus with diabetic polyneuropathy: Secondary | ICD-10-CM | POA: Diagnosis not present

## 2016-03-06 DIAGNOSIS — M86371 Chronic multifocal osteomyelitis, right ankle and foot: Secondary | ICD-10-CM | POA: Diagnosis not present

## 2016-03-06 DIAGNOSIS — E1169 Type 2 diabetes mellitus with other specified complication: Secondary | ICD-10-CM | POA: Diagnosis not present

## 2016-03-06 DIAGNOSIS — E11319 Type 2 diabetes mellitus with unspecified diabetic retinopathy without macular edema: Secondary | ICD-10-CM | POA: Diagnosis not present

## 2016-03-06 LAB — GLUCOSE, CAPILLARY
Glucose-Capillary: 182 mg/dL — ABNORMAL HIGH (ref 65–99)
Glucose-Capillary: 123 mg/dL — ABNORMAL HIGH (ref 65–99)

## 2016-03-07 DIAGNOSIS — E1169 Type 2 diabetes mellitus with other specified complication: Secondary | ICD-10-CM | POA: Diagnosis not present

## 2016-03-07 DIAGNOSIS — E11319 Type 2 diabetes mellitus with unspecified diabetic retinopathy without macular edema: Secondary | ICD-10-CM | POA: Diagnosis not present

## 2016-03-07 DIAGNOSIS — E1142 Type 2 diabetes mellitus with diabetic polyneuropathy: Secondary | ICD-10-CM | POA: Diagnosis not present

## 2016-03-07 DIAGNOSIS — L97511 Non-pressure chronic ulcer of other part of right foot limited to breakdown of skin: Secondary | ICD-10-CM | POA: Diagnosis not present

## 2016-03-07 DIAGNOSIS — Z794 Long term (current) use of insulin: Secondary | ICD-10-CM | POA: Diagnosis not present

## 2016-03-07 DIAGNOSIS — E11621 Type 2 diabetes mellitus with foot ulcer: Secondary | ICD-10-CM | POA: Diagnosis not present

## 2016-03-07 DIAGNOSIS — I10 Essential (primary) hypertension: Secondary | ICD-10-CM | POA: Diagnosis not present

## 2016-03-07 DIAGNOSIS — M86371 Chronic multifocal osteomyelitis, right ankle and foot: Secondary | ICD-10-CM | POA: Diagnosis not present

## 2016-03-07 LAB — GLUCOSE, CAPILLARY
GLUCOSE-CAPILLARY: 197 mg/dL — AB (ref 65–99)
Glucose-Capillary: 114 mg/dL — ABNORMAL HIGH (ref 65–99)
Glucose-Capillary: 138 mg/dL — ABNORMAL HIGH (ref 65–99)

## 2016-03-08 DIAGNOSIS — E1142 Type 2 diabetes mellitus with diabetic polyneuropathy: Secondary | ICD-10-CM | POA: Diagnosis not present

## 2016-03-08 DIAGNOSIS — L97511 Non-pressure chronic ulcer of other part of right foot limited to breakdown of skin: Secondary | ICD-10-CM | POA: Diagnosis not present

## 2016-03-08 DIAGNOSIS — L598 Other specified disorders of the skin and subcutaneous tissue related to radiation: Secondary | ICD-10-CM | POA: Diagnosis not present

## 2016-03-08 DIAGNOSIS — E11621 Type 2 diabetes mellitus with foot ulcer: Secondary | ICD-10-CM | POA: Diagnosis not present

## 2016-03-08 DIAGNOSIS — M86371 Chronic multifocal osteomyelitis, right ankle and foot: Secondary | ICD-10-CM | POA: Diagnosis not present

## 2016-03-08 DIAGNOSIS — I10 Essential (primary) hypertension: Secondary | ICD-10-CM | POA: Diagnosis not present

## 2016-03-08 DIAGNOSIS — Z794 Long term (current) use of insulin: Secondary | ICD-10-CM | POA: Diagnosis not present

## 2016-03-08 DIAGNOSIS — E11319 Type 2 diabetes mellitus with unspecified diabetic retinopathy without macular edema: Secondary | ICD-10-CM | POA: Diagnosis not present

## 2016-03-08 DIAGNOSIS — E1169 Type 2 diabetes mellitus with other specified complication: Secondary | ICD-10-CM | POA: Diagnosis not present

## 2016-03-09 ENCOUNTER — Encounter: Payer: Self-pay | Admitting: Podiatry

## 2016-03-09 ENCOUNTER — Ambulatory Visit (INDEPENDENT_AMBULATORY_CARE_PROVIDER_SITE_OTHER): Payer: BLUE CROSS/BLUE SHIELD | Admitting: Podiatry

## 2016-03-09 ENCOUNTER — Ambulatory Visit (INDEPENDENT_AMBULATORY_CARE_PROVIDER_SITE_OTHER): Payer: BLUE CROSS/BLUE SHIELD

## 2016-03-09 VITALS — BP 156/65 | HR 76 | Resp 18

## 2016-03-09 DIAGNOSIS — M86171 Other acute osteomyelitis, right ankle and foot: Secondary | ICD-10-CM | POA: Diagnosis not present

## 2016-03-09 DIAGNOSIS — Z09 Encounter for follow-up examination after completed treatment for conditions other than malignant neoplasm: Secondary | ICD-10-CM

## 2016-03-09 DIAGNOSIS — L97514 Non-pressure chronic ulcer of other part of right foot with necrosis of bone: Secondary | ICD-10-CM

## 2016-03-09 LAB — GLUCOSE, CAPILLARY
GLUCOSE-CAPILLARY: 151 mg/dL — AB (ref 65–99)
Glucose-Capillary: 108 mg/dL — ABNORMAL HIGH (ref 65–99)

## 2016-03-10 NOTE — Progress Notes (Signed)
Subjective: 60 year old female presents the office today postop visit #1 status post right hallux bone biopsy and excisional wound debridement. She states that she is doing well and having no pain. Denies any redness or drainage or any swelling to her foot.Denies any systemic complaints such as fevers, chills, nausea, vomiting. No acute changes since last appointment, and no other complaints at this time.   Objective: AAO x3, NAD DP/PT pulses palpable bilaterally, CRT less than 3 seconds Ulceration distal aspect right hallux. There is no probing to bone today and the wound appears to be healing. There is no edema, erythema, ascending cellulitis, fluctuance, crepitus, malodor. No edema, erythema, increase in warmth to bilateral lower extremities.  No open lesions or pre-ulcerative lesions.  No pain with calf compression, swelling, warmth, erythema  Assessment: Right hallux ulceration  Plan: -All treatment options discussed with the patient including all alternatives, risks, complications.  -Continue with daily dressing changes. Continue to follow-up of the wound care center. -Wound culture and pathology results were discussed the patient. Pathology was negative for osteomy arise however bone culture was positive. We'll start clindamycin and treated for osteomyelitis. -Monitor for signs or symptoms of infection to cure the ER should any occur -Follow-up as scheduled -X-rays next appointment. -Patient encouraged to call the office with any questions, concerns, change in symptoms.   Ovid Curd, DPM

## 2016-03-13 ENCOUNTER — Encounter (HOSPITAL_BASED_OUTPATIENT_CLINIC_OR_DEPARTMENT_OTHER): Payer: BLUE CROSS/BLUE SHIELD | Attending: Surgery

## 2016-03-13 DIAGNOSIS — B954 Other streptococcus as the cause of diseases classified elsewhere: Secondary | ICD-10-CM | POA: Insufficient documentation

## 2016-03-13 DIAGNOSIS — M86671 Other chronic osteomyelitis, right ankle and foot: Secondary | ICD-10-CM | POA: Diagnosis not present

## 2016-03-13 DIAGNOSIS — M86371 Chronic multifocal osteomyelitis, right ankle and foot: Secondary | ICD-10-CM | POA: Diagnosis not present

## 2016-03-13 DIAGNOSIS — L97511 Non-pressure chronic ulcer of other part of right foot limited to breakdown of skin: Secondary | ICD-10-CM | POA: Diagnosis not present

## 2016-03-13 DIAGNOSIS — E1169 Type 2 diabetes mellitus with other specified complication: Secondary | ICD-10-CM | POA: Diagnosis not present

## 2016-03-13 DIAGNOSIS — B9561 Methicillin susceptible Staphylococcus aureus infection as the cause of diseases classified elsewhere: Secondary | ICD-10-CM | POA: Insufficient documentation

## 2016-03-13 DIAGNOSIS — E11319 Type 2 diabetes mellitus with unspecified diabetic retinopathy without macular edema: Secondary | ICD-10-CM | POA: Insufficient documentation

## 2016-03-13 DIAGNOSIS — E1142 Type 2 diabetes mellitus with diabetic polyneuropathy: Secondary | ICD-10-CM | POA: Diagnosis not present

## 2016-03-13 DIAGNOSIS — Z794 Long term (current) use of insulin: Secondary | ICD-10-CM | POA: Diagnosis not present

## 2016-03-13 DIAGNOSIS — E11621 Type 2 diabetes mellitus with foot ulcer: Secondary | ICD-10-CM | POA: Insufficient documentation

## 2016-03-13 LAB — GLUCOSE, CAPILLARY
GLUCOSE-CAPILLARY: 125 mg/dL — AB (ref 65–99)
Glucose-Capillary: 202 mg/dL — ABNORMAL HIGH (ref 65–99)

## 2016-03-14 DIAGNOSIS — M86371 Chronic multifocal osteomyelitis, right ankle and foot: Secondary | ICD-10-CM | POA: Diagnosis not present

## 2016-03-14 DIAGNOSIS — E1169 Type 2 diabetes mellitus with other specified complication: Secondary | ICD-10-CM | POA: Diagnosis not present

## 2016-03-14 DIAGNOSIS — Z794 Long term (current) use of insulin: Secondary | ICD-10-CM | POA: Diagnosis not present

## 2016-03-14 DIAGNOSIS — L97512 Non-pressure chronic ulcer of other part of right foot with fat layer exposed: Secondary | ICD-10-CM | POA: Diagnosis not present

## 2016-03-14 DIAGNOSIS — B9561 Methicillin susceptible Staphylococcus aureus infection as the cause of diseases classified elsewhere: Secondary | ICD-10-CM | POA: Diagnosis not present

## 2016-03-14 DIAGNOSIS — E11319 Type 2 diabetes mellitus with unspecified diabetic retinopathy without macular edema: Secondary | ICD-10-CM | POA: Diagnosis not present

## 2016-03-14 DIAGNOSIS — M86671 Other chronic osteomyelitis, right ankle and foot: Secondary | ICD-10-CM | POA: Diagnosis not present

## 2016-03-14 DIAGNOSIS — B954 Other streptococcus as the cause of diseases classified elsewhere: Secondary | ICD-10-CM | POA: Diagnosis not present

## 2016-03-14 DIAGNOSIS — E11621 Type 2 diabetes mellitus with foot ulcer: Secondary | ICD-10-CM | POA: Diagnosis not present

## 2016-03-14 DIAGNOSIS — L97511 Non-pressure chronic ulcer of other part of right foot limited to breakdown of skin: Secondary | ICD-10-CM | POA: Diagnosis not present

## 2016-03-14 DIAGNOSIS — E1142 Type 2 diabetes mellitus with diabetic polyneuropathy: Secondary | ICD-10-CM | POA: Diagnosis not present

## 2016-03-14 LAB — GLUCOSE, CAPILLARY
Glucose-Capillary: 165 mg/dL — ABNORMAL HIGH (ref 65–99)
Glucose-Capillary: 144 mg/dL — ABNORMAL HIGH (ref 65–99)

## 2016-03-15 ENCOUNTER — Telehealth: Payer: Self-pay | Admitting: *Deleted

## 2016-03-15 ENCOUNTER — Ambulatory Visit (INDEPENDENT_AMBULATORY_CARE_PROVIDER_SITE_OTHER): Payer: BLUE CROSS/BLUE SHIELD | Admitting: Internal Medicine

## 2016-03-15 ENCOUNTER — Encounter: Payer: Self-pay | Admitting: Internal Medicine

## 2016-03-15 DIAGNOSIS — Z794 Long term (current) use of insulin: Secondary | ICD-10-CM | POA: Diagnosis not present

## 2016-03-15 DIAGNOSIS — E11621 Type 2 diabetes mellitus with foot ulcer: Secondary | ICD-10-CM | POA: Diagnosis not present

## 2016-03-15 DIAGNOSIS — T148 Other injury of unspecified body region: Secondary | ICD-10-CM

## 2016-03-15 DIAGNOSIS — L089 Local infection of the skin and subcutaneous tissue, unspecified: Secondary | ICD-10-CM | POA: Insufficient documentation

## 2016-03-15 DIAGNOSIS — L97511 Non-pressure chronic ulcer of other part of right foot limited to breakdown of skin: Secondary | ICD-10-CM | POA: Diagnosis not present

## 2016-03-15 DIAGNOSIS — B9561 Methicillin susceptible Staphylococcus aureus infection as the cause of diseases classified elsewhere: Secondary | ICD-10-CM | POA: Diagnosis not present

## 2016-03-15 DIAGNOSIS — E1169 Type 2 diabetes mellitus with other specified complication: Secondary | ICD-10-CM | POA: Diagnosis not present

## 2016-03-15 DIAGNOSIS — E11319 Type 2 diabetes mellitus with unspecified diabetic retinopathy without macular edema: Secondary | ICD-10-CM | POA: Diagnosis not present

## 2016-03-15 DIAGNOSIS — E1142 Type 2 diabetes mellitus with diabetic polyneuropathy: Secondary | ICD-10-CM | POA: Diagnosis not present

## 2016-03-15 DIAGNOSIS — L598 Other specified disorders of the skin and subcutaneous tissue related to radiation: Secondary | ICD-10-CM | POA: Diagnosis not present

## 2016-03-15 DIAGNOSIS — B954 Other streptococcus as the cause of diseases classified elsewhere: Secondary | ICD-10-CM | POA: Diagnosis not present

## 2016-03-15 DIAGNOSIS — M86671 Other chronic osteomyelitis, right ankle and foot: Secondary | ICD-10-CM | POA: Diagnosis not present

## 2016-03-15 DIAGNOSIS — T148XXA Other injury of unspecified body region, initial encounter: Principal | ICD-10-CM

## 2016-03-15 LAB — GLUCOSE, CAPILLARY
GLUCOSE-CAPILLARY: 172 mg/dL — AB (ref 65–99)
GLUCOSE-CAPILLARY: 111 mg/dL — AB (ref 65–99)

## 2016-03-15 MED ORDER — CEPHALEXIN 500 MG PO CAPS: 500.0000 mg | ORAL_CAPSULE | Freq: Four times a day (QID) | ORAL | 0 refills | Status: DC

## 2016-03-15 NOTE — Telephone Encounter (Signed)
Dr. Luciana Axe requested biopsy results 07/.19/2017. Faxed biopsy, wound culture and sensitivity results.

## 2016-03-15 NOTE — Progress Notes (Signed)
[facility] for Infectious Disease      Reason for Consult: acute osteomyelitis    Referring Physician: Dr. Con Memos    Patient ID: [name] [name], female    DOB: [birth year], 60 y.o.   MRN: [id number]  HPI:   She comes in for evaluation of possible osteomyelitis.  She gives me a history of a broken toe and then developed an ulcer and was sent to wound care with Dr. Con Memos.  She has wound treatments, now getting hyperbaric treatments.  Had an MRI done in June and distal phalanx had some edema and soft tissue ulceration and radiology suspected early diffuse osteomyelitis.  Follow up xrays though do not show a progressive osteomyelitis.  Biopsy of bone during debridement by Dr. Jacqualyn Posey shows no inflammation of the bone but culture did grow two organisms, MSSA and GBS.  She had been on Augmentin and then per the note from Dr. Jacqualyn Posey was to start clindamycin but she did not receive it.  CRP and ESR done previously were wnl.  No fever, no chills.  She did have some drainage initially but was not pus, sounds more c/w serosangiounous drainage.   Foot xray and MRI independently reviewed Previous record reviewed podiatry  Past Medical History:  Diagnosis Date  . Hypertension   . Microproteinuria   . Neuropathy ([place])   . Noncompliance   . Osteopenia   . Type 2 diabetes mellitus ([place])     Prior to Admission medications   Medication Sig Start Date End Date Taking? Authorizing Provider  Cyanocobalamin (B-12 PO) Take 1 tablet by mouth daily.   Yes Historical Provider, MD  Insulin Glargine (LANTUS SOLOSTAR) 100 UNIT/ML Solostar Pen Inject 46 Units into the skin daily at 10 pm.   Yes Historical Provider, MD  losartan (COZAAR) 100 MG tablet Take 100 mg by mouth daily.   Yes Historical Provider, MD  metFORMIN (GLUCOPHAGE) 500 MG tablet Take 500 mg by mouth 4 (four) times daily.   Yes Historical Provider, MD    No Known Allergies  Social History  Substance Use Topics  . Smoking status: Never  Smoker  . Smokeless tobacco: Not on file  . Alcohol use No    Family History  Problem Relation Age of Onset  . Diabetes Mother     Review of Systems  Constitutional: negative for fatigue Gastrointestinal: negative for diarrhea All other systems reviewed and are negative   Constitutional: in no apparent distress and alert  Vitals:   03/15/16 1416  BP: (!) 167/78  Temp: 97.8 F (36.6 C)   EYES: anicteric ENMT: Cardiovascular: Cor RRR and No murmurs Respiratory: CTA B; normal respiratory effort GI: Bowel sounds are normal, liver is not enlarged, spleen is not enlarged Musculoskeletal: no pedal edema noted; ulcer noted, no drainage Skin: negatives: no rash Neuro: non focal  Labs: Lab Results  Component Value Date   WBC 7.2 02/27/2016   HGB 12.2 02/27/2016   HCT 37.2 02/27/2016   MCV 80.7 02/27/2016   PLT 261 02/27/2016    Lab Results  Component Value Date   CREATININE 0.83 02/27/2016   BUN 16 02/27/2016   NA 140 02/27/2016   K 4.6 02/27/2016   CL 105 02/27/2016   CO2 25 02/27/2016    Lab Results  Component Value Date   ALT 15 11/17/2015   AST 18 11/17/2015   ALKPHOS 65 11/17/2015   BILITOT 0.4 11/17/2015     Assessment: chronic wound, culture positive bone biopsy with  negative pathology for osteomyelitis.  I do not feel this is consistent with osteomyelitis and does not require long-term IV antibiotics.  However, with the positive culture from the bone, I do feel it is a potential early infection vs contamination and I would opt to treat it orally for about 21 days.  I did receive the culture results and is pansensitive.     Plan: 1) keflex 500 mg qid for 21 days 2) rtc in 4 weeks for recheck along with ESR and CRP and if worse will consider IV antibiotics at that time.

## 2016-03-16 ENCOUNTER — Ambulatory Visit (INDEPENDENT_AMBULATORY_CARE_PROVIDER_SITE_OTHER): Payer: BLUE CROSS/BLUE SHIELD

## 2016-03-16 ENCOUNTER — Encounter: Payer: Self-pay | Admitting: Podiatry

## 2016-03-16 ENCOUNTER — Ambulatory Visit (INDEPENDENT_AMBULATORY_CARE_PROVIDER_SITE_OTHER): Payer: BLUE CROSS/BLUE SHIELD | Admitting: Podiatry

## 2016-03-16 DIAGNOSIS — M86679 Other chronic osteomyelitis, unspecified ankle and foot: Secondary | ICD-10-CM | POA: Diagnosis not present

## 2016-03-16 DIAGNOSIS — B9561 Methicillin susceptible Staphylococcus aureus infection as the cause of diseases classified elsewhere: Secondary | ICD-10-CM | POA: Diagnosis not present

## 2016-03-16 DIAGNOSIS — L97511 Non-pressure chronic ulcer of other part of right foot limited to breakdown of skin: Secondary | ICD-10-CM | POA: Diagnosis not present

## 2016-03-16 DIAGNOSIS — Z794 Long term (current) use of insulin: Secondary | ICD-10-CM | POA: Diagnosis not present

## 2016-03-16 DIAGNOSIS — Z09 Encounter for follow-up examination after completed treatment for conditions other than malignant neoplasm: Secondary | ICD-10-CM

## 2016-03-16 DIAGNOSIS — M86171 Other acute osteomyelitis, right ankle and foot: Secondary | ICD-10-CM

## 2016-03-16 DIAGNOSIS — M86671 Other chronic osteomyelitis, right ankle and foot: Secondary | ICD-10-CM | POA: Diagnosis not present

## 2016-03-16 DIAGNOSIS — E1169 Type 2 diabetes mellitus with other specified complication: Secondary | ICD-10-CM | POA: Diagnosis not present

## 2016-03-16 DIAGNOSIS — E1142 Type 2 diabetes mellitus with diabetic polyneuropathy: Secondary | ICD-10-CM | POA: Diagnosis not present

## 2016-03-16 DIAGNOSIS — E11621 Type 2 diabetes mellitus with foot ulcer: Secondary | ICD-10-CM | POA: Diagnosis not present

## 2016-03-16 DIAGNOSIS — B954 Other streptococcus as the cause of diseases classified elsewhere: Secondary | ICD-10-CM | POA: Diagnosis not present

## 2016-03-16 DIAGNOSIS — E11319 Type 2 diabetes mellitus with unspecified diabetic retinopathy without macular edema: Secondary | ICD-10-CM | POA: Diagnosis not present

## 2016-03-16 LAB — GLUCOSE, CAPILLARY
GLUCOSE-CAPILLARY: 175 mg/dL — AB (ref 65–99)
GLUCOSE-CAPILLARY: 117 mg/dL — AB (ref 65–99)

## 2016-03-19 DIAGNOSIS — E11621 Type 2 diabetes mellitus with foot ulcer: Secondary | ICD-10-CM | POA: Diagnosis not present

## 2016-03-19 DIAGNOSIS — E11319 Type 2 diabetes mellitus with unspecified diabetic retinopathy without macular edema: Secondary | ICD-10-CM | POA: Diagnosis not present

## 2016-03-19 DIAGNOSIS — M86671 Other chronic osteomyelitis, right ankle and foot: Secondary | ICD-10-CM | POA: Diagnosis not present

## 2016-03-19 DIAGNOSIS — L97511 Non-pressure chronic ulcer of other part of right foot limited to breakdown of skin: Secondary | ICD-10-CM | POA: Diagnosis not present

## 2016-03-19 DIAGNOSIS — B9561 Methicillin susceptible Staphylococcus aureus infection as the cause of diseases classified elsewhere: Secondary | ICD-10-CM | POA: Diagnosis not present

## 2016-03-19 DIAGNOSIS — E538 Deficiency of other specified B group vitamins: Secondary | ICD-10-CM | POA: Diagnosis not present

## 2016-03-19 DIAGNOSIS — E1142 Type 2 diabetes mellitus with diabetic polyneuropathy: Secondary | ICD-10-CM | POA: Diagnosis not present

## 2016-03-19 DIAGNOSIS — Z794 Long term (current) use of insulin: Secondary | ICD-10-CM | POA: Diagnosis not present

## 2016-03-19 DIAGNOSIS — E1169 Type 2 diabetes mellitus with other specified complication: Secondary | ICD-10-CM | POA: Diagnosis not present

## 2016-03-19 DIAGNOSIS — B954 Other streptococcus as the cause of diseases classified elsewhere: Secondary | ICD-10-CM | POA: Diagnosis not present

## 2016-03-19 LAB — GLUCOSE, CAPILLARY
GLUCOSE-CAPILLARY: 136 mg/dL — AB (ref 65–99)
Glucose-Capillary: 233 mg/dL — ABNORMAL HIGH (ref 65–99)

## 2016-03-20 DIAGNOSIS — Z794 Long term (current) use of insulin: Secondary | ICD-10-CM | POA: Diagnosis not present

## 2016-03-20 DIAGNOSIS — B954 Other streptococcus as the cause of diseases classified elsewhere: Secondary | ICD-10-CM | POA: Diagnosis not present

## 2016-03-20 DIAGNOSIS — M86671 Other chronic osteomyelitis, right ankle and foot: Secondary | ICD-10-CM | POA: Diagnosis not present

## 2016-03-20 DIAGNOSIS — L97511 Non-pressure chronic ulcer of other part of right foot limited to breakdown of skin: Secondary | ICD-10-CM | POA: Diagnosis not present

## 2016-03-20 DIAGNOSIS — B9561 Methicillin susceptible Staphylococcus aureus infection as the cause of diseases classified elsewhere: Secondary | ICD-10-CM | POA: Diagnosis not present

## 2016-03-20 DIAGNOSIS — E11319 Type 2 diabetes mellitus with unspecified diabetic retinopathy without macular edema: Secondary | ICD-10-CM | POA: Diagnosis not present

## 2016-03-20 DIAGNOSIS — E1169 Type 2 diabetes mellitus with other specified complication: Secondary | ICD-10-CM | POA: Diagnosis not present

## 2016-03-20 DIAGNOSIS — E11621 Type 2 diabetes mellitus with foot ulcer: Secondary | ICD-10-CM | POA: Diagnosis not present

## 2016-03-20 DIAGNOSIS — E1142 Type 2 diabetes mellitus with diabetic polyneuropathy: Secondary | ICD-10-CM | POA: Diagnosis not present

## 2016-03-20 LAB — GLUCOSE, CAPILLARY
GLUCOSE-CAPILLARY: 127 mg/dL — AB (ref 65–99)
Glucose-Capillary: 184 mg/dL — ABNORMAL HIGH (ref 65–99)

## 2016-03-21 DIAGNOSIS — E1142 Type 2 diabetes mellitus with diabetic polyneuropathy: Secondary | ICD-10-CM | POA: Diagnosis not present

## 2016-03-21 DIAGNOSIS — M86671 Other chronic osteomyelitis, right ankle and foot: Secondary | ICD-10-CM | POA: Diagnosis not present

## 2016-03-21 DIAGNOSIS — E11319 Type 2 diabetes mellitus with unspecified diabetic retinopathy without macular edema: Secondary | ICD-10-CM | POA: Diagnosis not present

## 2016-03-21 DIAGNOSIS — B9561 Methicillin susceptible Staphylococcus aureus infection as the cause of diseases classified elsewhere: Secondary | ICD-10-CM | POA: Diagnosis not present

## 2016-03-21 DIAGNOSIS — E1169 Type 2 diabetes mellitus with other specified complication: Secondary | ICD-10-CM | POA: Diagnosis not present

## 2016-03-21 DIAGNOSIS — Z794 Long term (current) use of insulin: Secondary | ICD-10-CM | POA: Diagnosis not present

## 2016-03-21 DIAGNOSIS — L97511 Non-pressure chronic ulcer of other part of right foot limited to breakdown of skin: Secondary | ICD-10-CM | POA: Diagnosis not present

## 2016-03-21 DIAGNOSIS — B954 Other streptococcus as the cause of diseases classified elsewhere: Secondary | ICD-10-CM | POA: Diagnosis not present

## 2016-03-21 DIAGNOSIS — E11621 Type 2 diabetes mellitus with foot ulcer: Secondary | ICD-10-CM | POA: Diagnosis not present

## 2016-03-21 LAB — GLUCOSE, CAPILLARY
Glucose-Capillary: 215 mg/dL — ABNORMAL HIGH (ref 65–99)
Glucose-Capillary: 188 mg/dL — ABNORMAL HIGH (ref 65–99)

## 2016-03-23 ENCOUNTER — Ambulatory Visit (INDEPENDENT_AMBULATORY_CARE_PROVIDER_SITE_OTHER): Payer: BLUE CROSS/BLUE SHIELD | Admitting: Nurse Practitioner

## 2016-03-23 ENCOUNTER — Encounter: Payer: Self-pay | Admitting: Nurse Practitioner

## 2016-03-23 VITALS — BP 140/76 | Temp 97.7°F | Ht 62.0 in | Wt 161.2 lb

## 2016-03-23 DIAGNOSIS — J300 Vasomotor rhinitis: Secondary | ICD-10-CM

## 2016-03-23 NOTE — Progress Notes (Signed)
Subjective:  Presents for complaints of bilateral ear pain now more on the left for the past 2 days. Has had problems off and on for the past couple of weeks. Has been in a hyperbaric chamber to help healing with an orthopedic issue. Has noticed increased pain during that time. No fever sore throat headache cough. Was given some nasal spray right before her hyperbaric treatment, thinks it may have been something similar to Afrin.  Objective:   BP 140/76   Temp 97.7 F (36.5 C) (Oral)   Ht  (1.575 m)   Wt 161 lb 4 oz (73.1 kg)   BMI 29.49 kg/m  NAD. Alert, oriented. TMs significantly retracted bilateral, no erythema. Pharynx injected with clear PND noted. Neck supple with mild soft anterior slightly tender adenopathy. Lungs clear. Heart regular rate rhythm.  Assessment:Vasomotor rhinitis  Plan: Switch to steroid nasal spray daily along with OTC antihistamine. Resume hyperbaric treatments as tolerated. Call back if worsens or persists.

## 2016-03-23 NOTE — Progress Notes (Addendum)
Subjective: 60 year o8ld female presents the office today postop visit #2 status post right hallux bone biopsy and excisional wound debridement. She states that she is doing well she is continued on antibiotics. She denies any swelling or drainage or redness of the toe. She feels that the wound is healing. She is also continue to follow-up with the wound care center. Denies any systemic complaints such as fevers, chills, nausea, vomiting. No acute changes since last appointment, and no other complaints at this time.   Objective: AAO x3, NAD DP/PT pulses palpable bilaterally, CRT less than 3 seconds Ulceration distal aspect right hallux. Today the wound measures probably 0.3 x 0.3 cm. There is no probing to bone, undermining or tunneling. There is no swelling erythema, ascending synovitis. There is no fluctuance or crepitus. There is no malodor. No drainage or pus. No other open lesions or pre-ulcer lesions identified at this time.  No pain with calf compression, swelling, warmth, erythema  Assessment: Right hallux ulceration, osteomyelitis   Plan: -All treatment options discussed with the patient including all alternatives, risks, complications.  -X-rays obtained and reviewed. Evidence of the previous bone biopsy site. No definitive evidence of acute osteomyelitis at this time. -Continue antibiotics per ID -Continue to follow with the wound care center. If she is acute for hyperbarics a this would be beneficial. Although should negative pathology her bone culture was positive for bacteria. -Continue to monitor for any signs or symptoms of worsening infection to cure the ER should any occur call the office. -Follow-up as scheduled or sooner if any issues are to arise.  Ovid CurdMatthew Wagoner, DPM

## 2016-03-23 NOTE — Patient Instructions (Signed)
flonase  nasacort AQ rhinocort AQ Claritin or Allegra

## 2016-03-28 DIAGNOSIS — E1142 Type 2 diabetes mellitus with diabetic polyneuropathy: Secondary | ICD-10-CM | POA: Diagnosis not present

## 2016-03-28 DIAGNOSIS — M86671 Other chronic osteomyelitis, right ankle and foot: Secondary | ICD-10-CM | POA: Diagnosis not present

## 2016-03-28 DIAGNOSIS — B954 Other streptococcus as the cause of diseases classified elsewhere: Secondary | ICD-10-CM | POA: Diagnosis not present

## 2016-03-28 DIAGNOSIS — E1169 Type 2 diabetes mellitus with other specified complication: Secondary | ICD-10-CM | POA: Diagnosis not present

## 2016-03-28 DIAGNOSIS — L97512 Non-pressure chronic ulcer of other part of right foot with fat layer exposed: Secondary | ICD-10-CM | POA: Diagnosis not present

## 2016-03-28 DIAGNOSIS — Z794 Long term (current) use of insulin: Secondary | ICD-10-CM | POA: Diagnosis not present

## 2016-03-28 DIAGNOSIS — E11319 Type 2 diabetes mellitus with unspecified diabetic retinopathy without macular edema: Secondary | ICD-10-CM | POA: Diagnosis not present

## 2016-03-28 DIAGNOSIS — L97511 Non-pressure chronic ulcer of other part of right foot limited to breakdown of skin: Secondary | ICD-10-CM | POA: Diagnosis not present

## 2016-03-28 DIAGNOSIS — M86371 Chronic multifocal osteomyelitis, right ankle and foot: Secondary | ICD-10-CM | POA: Diagnosis not present

## 2016-03-28 DIAGNOSIS — E11621 Type 2 diabetes mellitus with foot ulcer: Secondary | ICD-10-CM | POA: Diagnosis not present

## 2016-03-28 DIAGNOSIS — B9561 Methicillin susceptible Staphylococcus aureus infection as the cause of diseases classified elsewhere: Secondary | ICD-10-CM | POA: Diagnosis not present

## 2016-03-28 LAB — GLUCOSE, CAPILLARY
Glucose-Capillary: 235 mg/dL — ABNORMAL HIGH (ref 65–99)
Glucose-Capillary: 185 mg/dL — ABNORMAL HIGH (ref 65–99)

## 2016-03-29 DIAGNOSIS — B954 Other streptococcus as the cause of diseases classified elsewhere: Secondary | ICD-10-CM | POA: Diagnosis not present

## 2016-03-29 DIAGNOSIS — B9561 Methicillin susceptible Staphylococcus aureus infection as the cause of diseases classified elsewhere: Secondary | ICD-10-CM | POA: Diagnosis not present

## 2016-03-29 DIAGNOSIS — L97511 Non-pressure chronic ulcer of other part of right foot limited to breakdown of skin: Secondary | ICD-10-CM | POA: Diagnosis not present

## 2016-03-29 DIAGNOSIS — E11621 Type 2 diabetes mellitus with foot ulcer: Secondary | ICD-10-CM | POA: Diagnosis not present

## 2016-03-29 DIAGNOSIS — Z794 Long term (current) use of insulin: Secondary | ICD-10-CM | POA: Diagnosis not present

## 2016-03-29 DIAGNOSIS — E1169 Type 2 diabetes mellitus with other specified complication: Secondary | ICD-10-CM | POA: Diagnosis not present

## 2016-03-29 DIAGNOSIS — E11319 Type 2 diabetes mellitus with unspecified diabetic retinopathy without macular edema: Secondary | ICD-10-CM | POA: Diagnosis not present

## 2016-03-29 DIAGNOSIS — E1142 Type 2 diabetes mellitus with diabetic polyneuropathy: Secondary | ICD-10-CM | POA: Diagnosis not present

## 2016-03-29 DIAGNOSIS — M86671 Other chronic osteomyelitis, right ankle and foot: Secondary | ICD-10-CM | POA: Diagnosis not present

## 2016-03-29 LAB — GLUCOSE, CAPILLARY
GLUCOSE-CAPILLARY: 162 mg/dL — AB (ref 65–99)
Glucose-Capillary: 285 mg/dL — ABNORMAL HIGH (ref 65–99)

## 2016-03-30 DIAGNOSIS — E11621 Type 2 diabetes mellitus with foot ulcer: Secondary | ICD-10-CM | POA: Diagnosis not present

## 2016-03-30 DIAGNOSIS — M86671 Other chronic osteomyelitis, right ankle and foot: Secondary | ICD-10-CM | POA: Diagnosis not present

## 2016-03-30 DIAGNOSIS — L97511 Non-pressure chronic ulcer of other part of right foot limited to breakdown of skin: Secondary | ICD-10-CM | POA: Diagnosis not present

## 2016-03-30 DIAGNOSIS — Z794 Long term (current) use of insulin: Secondary | ICD-10-CM | POA: Diagnosis not present

## 2016-03-30 DIAGNOSIS — E1169 Type 2 diabetes mellitus with other specified complication: Secondary | ICD-10-CM | POA: Diagnosis not present

## 2016-03-30 DIAGNOSIS — E1142 Type 2 diabetes mellitus with diabetic polyneuropathy: Secondary | ICD-10-CM | POA: Diagnosis not present

## 2016-03-30 DIAGNOSIS — B954 Other streptococcus as the cause of diseases classified elsewhere: Secondary | ICD-10-CM | POA: Diagnosis not present

## 2016-03-30 DIAGNOSIS — B9561 Methicillin susceptible Staphylococcus aureus infection as the cause of diseases classified elsewhere: Secondary | ICD-10-CM | POA: Diagnosis not present

## 2016-03-30 DIAGNOSIS — E11319 Type 2 diabetes mellitus with unspecified diabetic retinopathy without macular edema: Secondary | ICD-10-CM | POA: Diagnosis not present

## 2016-03-30 LAB — GLUCOSE, CAPILLARY
GLUCOSE-CAPILLARY: 185 mg/dL — AB (ref 65–99)
GLUCOSE-CAPILLARY: 132 mg/dL — AB (ref 65–99)

## 2016-04-02 DIAGNOSIS — M86671 Other chronic osteomyelitis, right ankle and foot: Secondary | ICD-10-CM | POA: Diagnosis not present

## 2016-04-02 DIAGNOSIS — E11621 Type 2 diabetes mellitus with foot ulcer: Secondary | ICD-10-CM | POA: Diagnosis not present

## 2016-04-02 DIAGNOSIS — E11319 Type 2 diabetes mellitus with unspecified diabetic retinopathy without macular edema: Secondary | ICD-10-CM | POA: Diagnosis not present

## 2016-04-02 DIAGNOSIS — Z794 Long term (current) use of insulin: Secondary | ICD-10-CM | POA: Diagnosis not present

## 2016-04-02 DIAGNOSIS — L97511 Non-pressure chronic ulcer of other part of right foot limited to breakdown of skin: Secondary | ICD-10-CM | POA: Diagnosis not present

## 2016-04-02 DIAGNOSIS — E1169 Type 2 diabetes mellitus with other specified complication: Secondary | ICD-10-CM | POA: Diagnosis not present

## 2016-04-02 DIAGNOSIS — B9561 Methicillin susceptible Staphylococcus aureus infection as the cause of diseases classified elsewhere: Secondary | ICD-10-CM | POA: Diagnosis not present

## 2016-04-02 DIAGNOSIS — E1142 Type 2 diabetes mellitus with diabetic polyneuropathy: Secondary | ICD-10-CM | POA: Diagnosis not present

## 2016-04-02 DIAGNOSIS — B954 Other streptococcus as the cause of diseases classified elsewhere: Secondary | ICD-10-CM | POA: Diagnosis not present

## 2016-04-02 LAB — GLUCOSE, CAPILLARY
GLUCOSE-CAPILLARY: 220 mg/dL — AB (ref 65–99)
GLUCOSE-CAPILLARY: 173 mg/dL — AB (ref 65–99)

## 2016-04-03 DIAGNOSIS — E1169 Type 2 diabetes mellitus with other specified complication: Secondary | ICD-10-CM | POA: Diagnosis not present

## 2016-04-03 DIAGNOSIS — Z794 Long term (current) use of insulin: Secondary | ICD-10-CM | POA: Diagnosis not present

## 2016-04-03 DIAGNOSIS — E11621 Type 2 diabetes mellitus with foot ulcer: Secondary | ICD-10-CM | POA: Diagnosis not present

## 2016-04-03 DIAGNOSIS — E11319 Type 2 diabetes mellitus with unspecified diabetic retinopathy without macular edema: Secondary | ICD-10-CM | POA: Diagnosis not present

## 2016-04-03 DIAGNOSIS — B954 Other streptococcus as the cause of diseases classified elsewhere: Secondary | ICD-10-CM | POA: Diagnosis not present

## 2016-04-03 DIAGNOSIS — M86671 Other chronic osteomyelitis, right ankle and foot: Secondary | ICD-10-CM | POA: Diagnosis not present

## 2016-04-03 DIAGNOSIS — E1142 Type 2 diabetes mellitus with diabetic polyneuropathy: Secondary | ICD-10-CM | POA: Diagnosis not present

## 2016-04-03 DIAGNOSIS — B9561 Methicillin susceptible Staphylococcus aureus infection as the cause of diseases classified elsewhere: Secondary | ICD-10-CM | POA: Diagnosis not present

## 2016-04-03 DIAGNOSIS — L97511 Non-pressure chronic ulcer of other part of right foot limited to breakdown of skin: Secondary | ICD-10-CM | POA: Diagnosis not present

## 2016-04-03 LAB — GLUCOSE, CAPILLARY
GLUCOSE-CAPILLARY: 152 mg/dL — AB (ref 65–99)
Glucose-Capillary: 218 mg/dL — ABNORMAL HIGH (ref 65–99)

## 2016-04-04 DIAGNOSIS — E1142 Type 2 diabetes mellitus with diabetic polyneuropathy: Secondary | ICD-10-CM | POA: Diagnosis not present

## 2016-04-04 DIAGNOSIS — E11621 Type 2 diabetes mellitus with foot ulcer: Secondary | ICD-10-CM | POA: Diagnosis not present

## 2016-04-04 DIAGNOSIS — L97514 Non-pressure chronic ulcer of other part of right foot with necrosis of bone: Secondary | ICD-10-CM | POA: Diagnosis not present

## 2016-04-04 DIAGNOSIS — Z794 Long term (current) use of insulin: Secondary | ICD-10-CM | POA: Diagnosis not present

## 2016-04-04 DIAGNOSIS — E11319 Type 2 diabetes mellitus with unspecified diabetic retinopathy without macular edema: Secondary | ICD-10-CM | POA: Diagnosis not present

## 2016-04-04 DIAGNOSIS — M86671 Other chronic osteomyelitis, right ankle and foot: Secondary | ICD-10-CM | POA: Diagnosis not present

## 2016-04-04 DIAGNOSIS — B9561 Methicillin susceptible Staphylococcus aureus infection as the cause of diseases classified elsewhere: Secondary | ICD-10-CM | POA: Diagnosis not present

## 2016-04-04 DIAGNOSIS — B954 Other streptococcus as the cause of diseases classified elsewhere: Secondary | ICD-10-CM | POA: Diagnosis not present

## 2016-04-04 DIAGNOSIS — L97511 Non-pressure chronic ulcer of other part of right foot limited to breakdown of skin: Secondary | ICD-10-CM | POA: Diagnosis not present

## 2016-04-04 DIAGNOSIS — E1169 Type 2 diabetes mellitus with other specified complication: Secondary | ICD-10-CM | POA: Diagnosis not present

## 2016-04-04 LAB — GLUCOSE, CAPILLARY
GLUCOSE-CAPILLARY: 109 mg/dL — AB (ref 65–99)
Glucose-Capillary: 208 mg/dL — ABNORMAL HIGH (ref 65–99)

## 2016-04-05 DIAGNOSIS — E1142 Type 2 diabetes mellitus with diabetic polyneuropathy: Secondary | ICD-10-CM | POA: Diagnosis not present

## 2016-04-05 DIAGNOSIS — E11621 Type 2 diabetes mellitus with foot ulcer: Secondary | ICD-10-CM | POA: Diagnosis not present

## 2016-04-05 DIAGNOSIS — B9561 Methicillin susceptible Staphylococcus aureus infection as the cause of diseases classified elsewhere: Secondary | ICD-10-CM | POA: Diagnosis not present

## 2016-04-05 DIAGNOSIS — E11319 Type 2 diabetes mellitus with unspecified diabetic retinopathy without macular edema: Secondary | ICD-10-CM | POA: Diagnosis not present

## 2016-04-05 DIAGNOSIS — M86671 Other chronic osteomyelitis, right ankle and foot: Secondary | ICD-10-CM | POA: Diagnosis not present

## 2016-04-05 DIAGNOSIS — L97511 Non-pressure chronic ulcer of other part of right foot limited to breakdown of skin: Secondary | ICD-10-CM | POA: Diagnosis not present

## 2016-04-05 DIAGNOSIS — E1169 Type 2 diabetes mellitus with other specified complication: Secondary | ICD-10-CM | POA: Diagnosis not present

## 2016-04-05 DIAGNOSIS — B954 Other streptococcus as the cause of diseases classified elsewhere: Secondary | ICD-10-CM | POA: Diagnosis not present

## 2016-04-05 DIAGNOSIS — Z794 Long term (current) use of insulin: Secondary | ICD-10-CM | POA: Diagnosis not present

## 2016-04-05 LAB — GLUCOSE, CAPILLARY
GLUCOSE-CAPILLARY: 222 mg/dL — AB (ref 65–99)
GLUCOSE-CAPILLARY: 160 mg/dL — AB (ref 65–99)

## 2016-04-06 DIAGNOSIS — E1169 Type 2 diabetes mellitus with other specified complication: Secondary | ICD-10-CM | POA: Diagnosis not present

## 2016-04-06 DIAGNOSIS — Z794 Long term (current) use of insulin: Secondary | ICD-10-CM | POA: Diagnosis not present

## 2016-04-06 DIAGNOSIS — E11621 Type 2 diabetes mellitus with foot ulcer: Secondary | ICD-10-CM | POA: Diagnosis not present

## 2016-04-06 DIAGNOSIS — B9561 Methicillin susceptible Staphylococcus aureus infection as the cause of diseases classified elsewhere: Secondary | ICD-10-CM | POA: Diagnosis not present

## 2016-04-06 DIAGNOSIS — L97511 Non-pressure chronic ulcer of other part of right foot limited to breakdown of skin: Secondary | ICD-10-CM | POA: Diagnosis not present

## 2016-04-06 DIAGNOSIS — E11319 Type 2 diabetes mellitus with unspecified diabetic retinopathy without macular edema: Secondary | ICD-10-CM | POA: Diagnosis not present

## 2016-04-06 DIAGNOSIS — B954 Other streptococcus as the cause of diseases classified elsewhere: Secondary | ICD-10-CM | POA: Diagnosis not present

## 2016-04-06 DIAGNOSIS — E1142 Type 2 diabetes mellitus with diabetic polyneuropathy: Secondary | ICD-10-CM | POA: Diagnosis not present

## 2016-04-06 DIAGNOSIS — M86671 Other chronic osteomyelitis, right ankle and foot: Secondary | ICD-10-CM | POA: Diagnosis not present

## 2016-04-06 LAB — GLUCOSE, CAPILLARY
GLUCOSE-CAPILLARY: 216 mg/dL — AB (ref 65–99)
GLUCOSE-CAPILLARY: 135 mg/dL — AB (ref 65–99)

## 2016-04-09 DIAGNOSIS — L97511 Non-pressure chronic ulcer of other part of right foot limited to breakdown of skin: Secondary | ICD-10-CM | POA: Diagnosis not present

## 2016-04-09 DIAGNOSIS — M8668 Other chronic osteomyelitis, other site: Secondary | ICD-10-CM | POA: Diagnosis not present

## 2016-04-09 DIAGNOSIS — L97514 Non-pressure chronic ulcer of other part of right foot with necrosis of bone: Secondary | ICD-10-CM | POA: Diagnosis not present

## 2016-04-09 DIAGNOSIS — E11319 Type 2 diabetes mellitus with unspecified diabetic retinopathy without macular edema: Secondary | ICD-10-CM | POA: Diagnosis not present

## 2016-04-09 DIAGNOSIS — M86671 Other chronic osteomyelitis, right ankle and foot: Secondary | ICD-10-CM | POA: Diagnosis not present

## 2016-04-09 DIAGNOSIS — E1169 Type 2 diabetes mellitus with other specified complication: Secondary | ICD-10-CM | POA: Diagnosis not present

## 2016-04-09 DIAGNOSIS — B954 Other streptococcus as the cause of diseases classified elsewhere: Secondary | ICD-10-CM | POA: Diagnosis not present

## 2016-04-09 DIAGNOSIS — E11621 Type 2 diabetes mellitus with foot ulcer: Secondary | ICD-10-CM | POA: Diagnosis not present

## 2016-04-09 DIAGNOSIS — E1142 Type 2 diabetes mellitus with diabetic polyneuropathy: Secondary | ICD-10-CM | POA: Diagnosis not present

## 2016-04-09 DIAGNOSIS — Z794 Long term (current) use of insulin: Secondary | ICD-10-CM | POA: Diagnosis not present

## 2016-04-09 DIAGNOSIS — B9561 Methicillin susceptible Staphylococcus aureus infection as the cause of diseases classified elsewhere: Secondary | ICD-10-CM | POA: Diagnosis not present

## 2016-04-09 LAB — GLUCOSE, CAPILLARY
GLUCOSE-CAPILLARY: 228 mg/dL — AB (ref 65–99)
Glucose-Capillary: 164 mg/dL — ABNORMAL HIGH (ref 65–99)

## 2016-04-10 DIAGNOSIS — B954 Other streptococcus as the cause of diseases classified elsewhere: Secondary | ICD-10-CM | POA: Diagnosis not present

## 2016-04-10 DIAGNOSIS — E11621 Type 2 diabetes mellitus with foot ulcer: Secondary | ICD-10-CM | POA: Diagnosis not present

## 2016-04-10 DIAGNOSIS — Z794 Long term (current) use of insulin: Secondary | ICD-10-CM | POA: Diagnosis not present

## 2016-04-10 DIAGNOSIS — E11319 Type 2 diabetes mellitus with unspecified diabetic retinopathy without macular edema: Secondary | ICD-10-CM | POA: Diagnosis not present

## 2016-04-10 DIAGNOSIS — E1169 Type 2 diabetes mellitus with other specified complication: Secondary | ICD-10-CM | POA: Diagnosis not present

## 2016-04-10 DIAGNOSIS — B9561 Methicillin susceptible Staphylococcus aureus infection as the cause of diseases classified elsewhere: Secondary | ICD-10-CM | POA: Diagnosis not present

## 2016-04-10 DIAGNOSIS — E1142 Type 2 diabetes mellitus with diabetic polyneuropathy: Secondary | ICD-10-CM | POA: Diagnosis not present

## 2016-04-10 DIAGNOSIS — M86671 Other chronic osteomyelitis, right ankle and foot: Secondary | ICD-10-CM | POA: Diagnosis not present

## 2016-04-10 DIAGNOSIS — L97511 Non-pressure chronic ulcer of other part of right foot limited to breakdown of skin: Secondary | ICD-10-CM | POA: Diagnosis not present

## 2016-04-10 LAB — GLUCOSE, CAPILLARY
Glucose-Capillary: 208 mg/dL — ABNORMAL HIGH (ref 65–99)
Glucose-Capillary: 140 mg/dL — ABNORMAL HIGH (ref 65–99)

## 2016-04-11 DIAGNOSIS — E11319 Type 2 diabetes mellitus with unspecified diabetic retinopathy without macular edema: Secondary | ICD-10-CM | POA: Diagnosis not present

## 2016-04-11 DIAGNOSIS — L97514 Non-pressure chronic ulcer of other part of right foot with necrosis of bone: Secondary | ICD-10-CM | POA: Diagnosis not present

## 2016-04-11 DIAGNOSIS — M86671 Other chronic osteomyelitis, right ankle and foot: Secondary | ICD-10-CM | POA: Diagnosis not present

## 2016-04-11 DIAGNOSIS — E11621 Type 2 diabetes mellitus with foot ulcer: Secondary | ICD-10-CM | POA: Diagnosis not present

## 2016-04-11 DIAGNOSIS — E1142 Type 2 diabetes mellitus with diabetic polyneuropathy: Secondary | ICD-10-CM | POA: Diagnosis not present

## 2016-04-11 DIAGNOSIS — L97511 Non-pressure chronic ulcer of other part of right foot limited to breakdown of skin: Secondary | ICD-10-CM | POA: Diagnosis not present

## 2016-04-11 DIAGNOSIS — E1169 Type 2 diabetes mellitus with other specified complication: Secondary | ICD-10-CM | POA: Diagnosis not present

## 2016-04-11 DIAGNOSIS — B9561 Methicillin susceptible Staphylococcus aureus infection as the cause of diseases classified elsewhere: Secondary | ICD-10-CM | POA: Diagnosis not present

## 2016-04-11 DIAGNOSIS — Z794 Long term (current) use of insulin: Secondary | ICD-10-CM | POA: Diagnosis not present

## 2016-04-11 DIAGNOSIS — B954 Other streptococcus as the cause of diseases classified elsewhere: Secondary | ICD-10-CM | POA: Diagnosis not present

## 2016-04-11 LAB — GLUCOSE, CAPILLARY: GLUCOSE-CAPILLARY: 153 mg/dL — AB (ref 65–99)

## 2016-04-12 ENCOUNTER — Encounter: Payer: Self-pay | Admitting: Internal Medicine

## 2016-04-12 ENCOUNTER — Ambulatory Visit (INDEPENDENT_AMBULATORY_CARE_PROVIDER_SITE_OTHER): Payer: BLUE CROSS/BLUE SHIELD | Admitting: Internal Medicine

## 2016-04-12 VITALS — BP 173/78 | HR 83 | Temp 98.2°F | Ht 62.0 in | Wt 166.0 lb

## 2016-04-12 DIAGNOSIS — E1169 Type 2 diabetes mellitus with other specified complication: Secondary | ICD-10-CM | POA: Diagnosis not present

## 2016-04-12 DIAGNOSIS — B9561 Methicillin susceptible Staphylococcus aureus infection as the cause of diseases classified elsewhere: Secondary | ICD-10-CM | POA: Diagnosis not present

## 2016-04-12 DIAGNOSIS — M86671 Other chronic osteomyelitis, right ankle and foot: Secondary | ICD-10-CM | POA: Diagnosis not present

## 2016-04-12 DIAGNOSIS — T148 Other injury of unspecified body region: Secondary | ICD-10-CM | POA: Diagnosis not present

## 2016-04-12 DIAGNOSIS — L97514 Non-pressure chronic ulcer of other part of right foot with necrosis of bone: Secondary | ICD-10-CM | POA: Diagnosis not present

## 2016-04-12 DIAGNOSIS — B954 Other streptococcus as the cause of diseases classified elsewhere: Secondary | ICD-10-CM | POA: Diagnosis not present

## 2016-04-12 DIAGNOSIS — Z794 Long term (current) use of insulin: Secondary | ICD-10-CM | POA: Diagnosis not present

## 2016-04-12 DIAGNOSIS — L089 Local infection of the skin and subcutaneous tissue, unspecified: Secondary | ICD-10-CM | POA: Diagnosis not present

## 2016-04-12 DIAGNOSIS — L97511 Non-pressure chronic ulcer of other part of right foot limited to breakdown of skin: Secondary | ICD-10-CM | POA: Diagnosis not present

## 2016-04-12 DIAGNOSIS — Z23 Encounter for immunization: Secondary | ICD-10-CM | POA: Diagnosis not present

## 2016-04-12 DIAGNOSIS — T148XXA Other injury of unspecified body region, initial encounter: Principal | ICD-10-CM

## 2016-04-12 DIAGNOSIS — E11319 Type 2 diabetes mellitus with unspecified diabetic retinopathy without macular edema: Secondary | ICD-10-CM | POA: Diagnosis not present

## 2016-04-12 DIAGNOSIS — M8668 Other chronic osteomyelitis, other site: Secondary | ICD-10-CM | POA: Diagnosis not present

## 2016-04-12 DIAGNOSIS — E1142 Type 2 diabetes mellitus with diabetic polyneuropathy: Secondary | ICD-10-CM | POA: Diagnosis not present

## 2016-04-12 DIAGNOSIS — E11621 Type 2 diabetes mellitus with foot ulcer: Secondary | ICD-10-CM | POA: Diagnosis not present

## 2016-04-12 LAB — GLUCOSE, CAPILLARY
GLUCOSE-CAPILLARY: 91 mg/dL (ref 65–99)
Glucose-Capillary: 238 mg/dL — ABNORMAL HIGH (ref 65–99)
Glucose-Capillary: 142 mg/dL — ABNORMAL HIGH (ref 65–99)

## 2016-04-12 LAB — C-REACTIVE PROTEIN

## 2016-04-12 NOTE — Addendum Note (Signed)
Addended by: Wendall MolaOCKERHAM, Ran[place] Coach A on: 04/12/2016 03:06 PM   Modules accepted: Orders

## 2016-04-12 NOTE — Progress Notes (Signed)
[facility]      Patient ID: [name] [name], female    DOB: Nov 19, 1955, 60 y.o.   MRN: [id number]  HPI:   She comes in for follow up of possible osteomyelitis.  She gives me a history of a broken toe and then developed an ulcer and was sent to wound care with Dr. Con Memos.  She has wound treatments, now getting hyperbaric treatments.  Had an MRI done in June and distal phalanx had some edema and soft tissue ulceration and radiology suspected early diffuse osteomyelitis.  Follow up xrays though do not show a progressive osteomyelitis.  Biopsy of bone during debridement by Dr. Jacqualyn Posey shows no inflammation of the bone but culture did grow two organisms, MSSA and GBS.  She had been on Augmentin and then per the note from Dr. Jacqualyn Posey was to start clindamycin but she did not receive it.  CRP and ESR done previously were wnl.  No fever, no chills.  She has had some drainage initially but was not pus, sounds more c/w serosangiounous drainage.    Past Medical History:  Diagnosis Date  . Hypertension   . Microproteinuria   . Neuropathy ([place])   . Noncompliance   . Osteopenia   . Type 2 diabetes mellitus ([place])     Prior to Admission medications   Medication Sig Start Date End Date Taking? Authorizing Provider  Cyanocobalamin (B-12 PO) Take 1 tablet by mouth daily.   Yes Historical Provider, MD  Insulin Glargine (LANTUS SOLOSTAR) 100 UNIT/ML Solostar Pen Inject 46 Units into the skin daily at 10 pm.   Yes Historical Provider, MD  losartan (COZAAR) 100 MG tablet Take 100 mg by mouth daily.   Yes Historical Provider, MD  metFORMIN (GLUCOPHAGE) 500 MG tablet Take 500 mg by mouth 4 (four) times daily.   Yes Historical Provider, MD    No Known Allergies  Social History  Substance Use Topics  . Smoking status: Never Smoker  . Smokeless tobacco: Never Used  . Alcohol use No    Family History  Problem Relation Age of Onset  . Diabetes Mother     Review of Systems  Constitutional: negative for fatigue Gastrointestinal: negative for diarrhea All other systems reviewed and are negative   Constitutional: in no apparent distress and alert  Vitals:   04/12/16 1419  BP: (!) 173/78  Pulse: 83  Temp: 98.2 F (36.8 C)   Musculoskeletal: no pedal edema noted; ulcer noted, no drainage; no surrounding erythema Skin: negatives: no rash Neuro: non focal  Labs: Lab Results  Component Value Date   WBC 7.2 02/27/2016   HGB 12.2 02/27/2016   HCT 37.2 02/27/2016   MCV 80.7 02/27/2016   PLT 261 02/27/2016    Lab Results  Component Value Date   CREATININE 0.83 02/27/2016   BUN 16 02/27/2016   NA 140 02/27/2016   K 4.6 02/27/2016   CL 105 02/27/2016   CO2 25 02/27/2016    Lab Results  Component Value Date   ALT 15 11/17/2015   AST 18 11/17/2015   ALKPHOS 65 11/17/2015   BILITOT 0.4 11/17/2015     Assessment: wound infection.  Improved and no signs of infection at this time.   Plan: stop antibiotics and observe and check CRP, ESR.  Can follow up PRN for concerns for any infection.

## 2016-04-13 ENCOUNTER — Other Ambulatory Visit: Payer: Self-pay | Admitting: Podiatry

## 2016-04-13 ENCOUNTER — Encounter (HOSPITAL_BASED_OUTPATIENT_CLINIC_OR_DEPARTMENT_OTHER): Payer: BLUE CROSS/BLUE SHIELD | Attending: Internal Medicine

## 2016-04-13 ENCOUNTER — Ambulatory Visit (INDEPENDENT_AMBULATORY_CARE_PROVIDER_SITE_OTHER): Payer: BLUE CROSS/BLUE SHIELD

## 2016-04-13 ENCOUNTER — Encounter: Payer: Self-pay | Admitting: Podiatry

## 2016-04-13 ENCOUNTER — Ambulatory Visit (INDEPENDENT_AMBULATORY_CARE_PROVIDER_SITE_OTHER): Payer: BLUE CROSS/BLUE SHIELD | Admitting: Podiatry

## 2016-04-13 DIAGNOSIS — L97512 Non-pressure chronic ulcer of other part of right foot with fat layer exposed: Secondary | ICD-10-CM | POA: Diagnosis not present

## 2016-04-13 DIAGNOSIS — M86171 Other acute osteomyelitis, right ankle and foot: Secondary | ICD-10-CM

## 2016-04-13 DIAGNOSIS — Z794 Long term (current) use of insulin: Secondary | ICD-10-CM | POA: Diagnosis not present

## 2016-04-13 DIAGNOSIS — E1165 Type 2 diabetes mellitus with hyperglycemia: Secondary | ICD-10-CM | POA: Diagnosis not present

## 2016-04-13 DIAGNOSIS — Z09 Encounter for follow-up examination after completed treatment for conditions other than malignant neoplasm: Secondary | ICD-10-CM

## 2016-04-13 DIAGNOSIS — E11319 Type 2 diabetes mellitus with unspecified diabetic retinopathy without macular edema: Secondary | ICD-10-CM | POA: Insufficient documentation

## 2016-04-13 DIAGNOSIS — E11621 Type 2 diabetes mellitus with foot ulcer: Secondary | ICD-10-CM | POA: Diagnosis not present

## 2016-04-13 DIAGNOSIS — E1142 Type 2 diabetes mellitus with diabetic polyneuropathy: Secondary | ICD-10-CM | POA: Insufficient documentation

## 2016-04-13 DIAGNOSIS — M86371 Chronic multifocal osteomyelitis, right ankle and foot: Secondary | ICD-10-CM | POA: Insufficient documentation

## 2016-04-13 DIAGNOSIS — L84 Corns and callosities: Secondary | ICD-10-CM | POA: Diagnosis not present

## 2016-04-13 LAB — GLUCOSE, CAPILLARY
Glucose-Capillary: 214 mg/dL — ABNORMAL HIGH (ref 65–99)
Glucose-Capillary: 157 mg/dL — ABNORMAL HIGH (ref 65–99)

## 2016-04-13 LAB — SEDIMENTATION RATE: SED RATE: 4 mm/h (ref 0–30)

## 2016-04-17 DIAGNOSIS — E11319 Type 2 diabetes mellitus with unspecified diabetic retinopathy without macular edema: Secondary | ICD-10-CM | POA: Diagnosis not present

## 2016-04-17 DIAGNOSIS — E11621 Type 2 diabetes mellitus with foot ulcer: Secondary | ICD-10-CM | POA: Diagnosis not present

## 2016-04-17 DIAGNOSIS — L97512 Non-pressure chronic ulcer of other part of right foot with fat layer exposed: Secondary | ICD-10-CM | POA: Diagnosis not present

## 2016-04-17 DIAGNOSIS — L84 Corns and callosities: Secondary | ICD-10-CM | POA: Diagnosis not present

## 2016-04-17 DIAGNOSIS — E1142 Type 2 diabetes mellitus with diabetic polyneuropathy: Secondary | ICD-10-CM | POA: Diagnosis not present

## 2016-04-17 DIAGNOSIS — E1165 Type 2 diabetes mellitus with hyperglycemia: Secondary | ICD-10-CM | POA: Diagnosis not present

## 2016-04-17 DIAGNOSIS — M86371 Chronic multifocal osteomyelitis, right ankle and foot: Secondary | ICD-10-CM | POA: Diagnosis not present

## 2016-04-17 DIAGNOSIS — Z794 Long term (current) use of insulin: Secondary | ICD-10-CM | POA: Diagnosis not present

## 2016-04-17 LAB — GLUCOSE, CAPILLARY
Glucose-Capillary: 162 mg/dL — ABNORMAL HIGH (ref 65–99)
Glucose-Capillary: 104 mg/dL — ABNORMAL HIGH (ref 65–99)

## 2016-04-18 DIAGNOSIS — E1142 Type 2 diabetes mellitus with diabetic polyneuropathy: Secondary | ICD-10-CM | POA: Diagnosis not present

## 2016-04-18 DIAGNOSIS — L97512 Non-pressure chronic ulcer of other part of right foot with fat layer exposed: Secondary | ICD-10-CM | POA: Diagnosis not present

## 2016-04-18 DIAGNOSIS — L84 Corns and callosities: Secondary | ICD-10-CM | POA: Diagnosis not present

## 2016-04-18 DIAGNOSIS — M86371 Chronic multifocal osteomyelitis, right ankle and foot: Secondary | ICD-10-CM | POA: Diagnosis not present

## 2016-04-18 DIAGNOSIS — E11319 Type 2 diabetes mellitus with unspecified diabetic retinopathy without macular edema: Secondary | ICD-10-CM | POA: Diagnosis not present

## 2016-04-18 DIAGNOSIS — Z794 Long term (current) use of insulin: Secondary | ICD-10-CM | POA: Diagnosis not present

## 2016-04-18 DIAGNOSIS — E1165 Type 2 diabetes mellitus with hyperglycemia: Secondary | ICD-10-CM | POA: Diagnosis not present

## 2016-04-18 DIAGNOSIS — L97514 Non-pressure chronic ulcer of other part of right foot with necrosis of bone: Secondary | ICD-10-CM | POA: Diagnosis not present

## 2016-04-18 DIAGNOSIS — E11621 Type 2 diabetes mellitus with foot ulcer: Secondary | ICD-10-CM | POA: Diagnosis not present

## 2016-04-18 LAB — GLUCOSE, CAPILLARY
GLUCOSE-CAPILLARY: 123 mg/dL — AB (ref 65–99)
Glucose-Capillary: 186 mg/dL — ABNORMAL HIGH (ref 65–99)

## 2016-04-19 DIAGNOSIS — M8668 Other chronic osteomyelitis, other site: Secondary | ICD-10-CM | POA: Diagnosis not present

## 2016-04-19 DIAGNOSIS — Z794 Long term (current) use of insulin: Secondary | ICD-10-CM | POA: Diagnosis not present

## 2016-04-19 DIAGNOSIS — E11319 Type 2 diabetes mellitus with unspecified diabetic retinopathy without macular edema: Secondary | ICD-10-CM | POA: Diagnosis not present

## 2016-04-19 DIAGNOSIS — L97514 Non-pressure chronic ulcer of other part of right foot with necrosis of bone: Secondary | ICD-10-CM | POA: Diagnosis not present

## 2016-04-19 DIAGNOSIS — E1142 Type 2 diabetes mellitus with diabetic polyneuropathy: Secondary | ICD-10-CM | POA: Diagnosis not present

## 2016-04-19 DIAGNOSIS — L84 Corns and callosities: Secondary | ICD-10-CM | POA: Diagnosis not present

## 2016-04-19 DIAGNOSIS — E1165 Type 2 diabetes mellitus with hyperglycemia: Secondary | ICD-10-CM | POA: Diagnosis not present

## 2016-04-19 DIAGNOSIS — L97512 Non-pressure chronic ulcer of other part of right foot with fat layer exposed: Secondary | ICD-10-CM | POA: Diagnosis not present

## 2016-04-19 DIAGNOSIS — E11621 Type 2 diabetes mellitus with foot ulcer: Secondary | ICD-10-CM | POA: Diagnosis not present

## 2016-04-19 DIAGNOSIS — M86371 Chronic multifocal osteomyelitis, right ankle and foot: Secondary | ICD-10-CM | POA: Diagnosis not present

## 2016-04-19 LAB — GLUCOSE, CAPILLARY
GLUCOSE-CAPILLARY: 185 mg/dL — AB (ref 65–99)
Glucose-Capillary: 252 mg/dL — ABNORMAL HIGH (ref 65–99)

## 2016-04-20 DIAGNOSIS — Z794 Long term (current) use of insulin: Secondary | ICD-10-CM | POA: Diagnosis not present

## 2016-04-20 DIAGNOSIS — E11319 Type 2 diabetes mellitus with unspecified diabetic retinopathy without macular edema: Secondary | ICD-10-CM | POA: Diagnosis not present

## 2016-04-20 DIAGNOSIS — M86371 Chronic multifocal osteomyelitis, right ankle and foot: Secondary | ICD-10-CM | POA: Diagnosis not present

## 2016-04-20 DIAGNOSIS — L97512 Non-pressure chronic ulcer of other part of right foot with fat layer exposed: Secondary | ICD-10-CM | POA: Diagnosis not present

## 2016-04-20 DIAGNOSIS — L84 Corns and callosities: Secondary | ICD-10-CM | POA: Diagnosis not present

## 2016-04-20 DIAGNOSIS — E1165 Type 2 diabetes mellitus with hyperglycemia: Secondary | ICD-10-CM | POA: Diagnosis not present

## 2016-04-20 DIAGNOSIS — E1142 Type 2 diabetes mellitus with diabetic polyneuropathy: Secondary | ICD-10-CM | POA: Diagnosis not present

## 2016-04-20 DIAGNOSIS — M8668 Other chronic osteomyelitis, other site: Secondary | ICD-10-CM | POA: Diagnosis not present

## 2016-04-20 DIAGNOSIS — L97514 Non-pressure chronic ulcer of other part of right foot with necrosis of bone: Secondary | ICD-10-CM | POA: Diagnosis not present

## 2016-04-20 DIAGNOSIS — E11621 Type 2 diabetes mellitus with foot ulcer: Secondary | ICD-10-CM | POA: Diagnosis not present

## 2016-04-20 LAB — GLUCOSE, CAPILLARY
GLUCOSE-CAPILLARY: 190 mg/dL — AB (ref 65–99)
GLUCOSE-CAPILLARY: 140 mg/dL — AB (ref 65–99)

## 2016-04-22 DIAGNOSIS — Z8739 Personal history of other diseases of the musculoskeletal system and connective tissue: Secondary | ICD-10-CM | POA: Insufficient documentation

## 2016-04-22 DIAGNOSIS — M86171 Other acute osteomyelitis, right ankle and foot: Secondary | ICD-10-CM | POA: Insufficient documentation

## 2016-04-22 NOTE — Progress Notes (Signed)
Subjective: 60 year old female presents the office today postop visit #3 status post right hallux bone biopsy and excisional wound debridement. She is finishing her course of antibiotic as directed by ID. She states the wound is better. She is still going to the wound care center as well. Denies any drainage, pus, redness or streaking. She states that she is doing well she is continued on antibiotics. She denies any swelling or drainage or redness of the toe. She feels that the wound is healing. She is also continue to follow-up with the wound care center. Denies any systemic complaints such as fevers, chills, nausea, vomiting. No acute changes since last appointment, and no other complaints at this time.   Objective: AAO x3, NAD DP/PT pulses palpable bilaterally, CRT less than 3 seconds Ulceration distal aspect right hallux. Today the wound measures probably 0.2 x 0.2 cm. There is no probing to bone, undermining or tunneling. There is no surrounding erythema, ascending cellulitis. There is no fluctuance or crepitus. There is no malodor. No drainage or pus. No other open lesions or pre-ulcer lesions identified at this time.  No pain with calf compression, swelling, warmth, erythema  Assessment: Right hallux ulceration, osteomyelitis   Plan: -All treatment options discussed with the patient including all alternatives, risks, complications.  -Continue antibiotics per ID- she finishes them today.  -Continue to follow with the wound care center, HBO -Continue to monitor for any signs or symptoms of worsening infection to cure the ER should any occur call the office. -Follow-up as scheduled or sooner if any issues are to arise.  Ovid CurdMatthew Ashlinn Hemrick, DPM

## 2016-04-23 DIAGNOSIS — E11319 Type 2 diabetes mellitus with unspecified diabetic retinopathy without macular edema: Secondary | ICD-10-CM | POA: Diagnosis not present

## 2016-04-23 DIAGNOSIS — L97512 Non-pressure chronic ulcer of other part of right foot with fat layer exposed: Secondary | ICD-10-CM | POA: Diagnosis not present

## 2016-04-23 DIAGNOSIS — E11621 Type 2 diabetes mellitus with foot ulcer: Secondary | ICD-10-CM | POA: Diagnosis not present

## 2016-04-23 DIAGNOSIS — M8668 Other chronic osteomyelitis, other site: Secondary | ICD-10-CM | POA: Diagnosis not present

## 2016-04-23 DIAGNOSIS — Z794 Long term (current) use of insulin: Secondary | ICD-10-CM | POA: Diagnosis not present

## 2016-04-23 DIAGNOSIS — E1142 Type 2 diabetes mellitus with diabetic polyneuropathy: Secondary | ICD-10-CM | POA: Diagnosis not present

## 2016-04-23 DIAGNOSIS — M86371 Chronic multifocal osteomyelitis, right ankle and foot: Secondary | ICD-10-CM | POA: Diagnosis not present

## 2016-04-23 DIAGNOSIS — L84 Corns and callosities: Secondary | ICD-10-CM | POA: Diagnosis not present

## 2016-04-23 DIAGNOSIS — E1165 Type 2 diabetes mellitus with hyperglycemia: Secondary | ICD-10-CM | POA: Diagnosis not present

## 2016-04-23 LAB — GLUCOSE, CAPILLARY
GLUCOSE-CAPILLARY: 273 mg/dL — AB (ref 65–99)
Glucose-Capillary: 321 mg/dL — ABNORMAL HIGH (ref 65–99)

## 2016-04-25 DIAGNOSIS — E11621 Type 2 diabetes mellitus with foot ulcer: Secondary | ICD-10-CM | POA: Diagnosis not present

## 2016-04-25 DIAGNOSIS — E1142 Type 2 diabetes mellitus with diabetic polyneuropathy: Secondary | ICD-10-CM | POA: Diagnosis not present

## 2016-04-25 DIAGNOSIS — E538 Deficiency of other specified B group vitamins: Secondary | ICD-10-CM | POA: Diagnosis not present

## 2016-04-25 DIAGNOSIS — M86371 Chronic multifocal osteomyelitis, right ankle and foot: Secondary | ICD-10-CM | POA: Diagnosis not present

## 2016-04-25 DIAGNOSIS — Z794 Long term (current) use of insulin: Secondary | ICD-10-CM | POA: Diagnosis not present

## 2016-04-25 DIAGNOSIS — E1165 Type 2 diabetes mellitus with hyperglycemia: Secondary | ICD-10-CM | POA: Diagnosis not present

## 2016-04-25 DIAGNOSIS — L97512 Non-pressure chronic ulcer of other part of right foot with fat layer exposed: Secondary | ICD-10-CM | POA: Diagnosis not present

## 2016-04-25 DIAGNOSIS — L84 Corns and callosities: Secondary | ICD-10-CM | POA: Diagnosis not present

## 2016-04-25 DIAGNOSIS — E11319 Type 2 diabetes mellitus with unspecified diabetic retinopathy without macular edema: Secondary | ICD-10-CM | POA: Diagnosis not present

## 2016-05-02 DIAGNOSIS — L97512 Non-pressure chronic ulcer of other part of right foot with fat layer exposed: Secondary | ICD-10-CM | POA: Diagnosis not present

## 2016-05-02 DIAGNOSIS — L84 Corns and callosities: Secondary | ICD-10-CM | POA: Diagnosis not present

## 2016-05-02 DIAGNOSIS — E1142 Type 2 diabetes mellitus with diabetic polyneuropathy: Secondary | ICD-10-CM | POA: Diagnosis not present

## 2016-05-02 DIAGNOSIS — E11621 Type 2 diabetes mellitus with foot ulcer: Secondary | ICD-10-CM | POA: Diagnosis not present

## 2016-05-02 DIAGNOSIS — E11319 Type 2 diabetes mellitus with unspecified diabetic retinopathy without macular edema: Secondary | ICD-10-CM | POA: Diagnosis not present

## 2016-05-02 DIAGNOSIS — M86371 Chronic multifocal osteomyelitis, right ankle and foot: Secondary | ICD-10-CM | POA: Diagnosis not present

## 2016-05-02 DIAGNOSIS — Z794 Long term (current) use of insulin: Secondary | ICD-10-CM | POA: Diagnosis not present

## 2016-05-02 DIAGNOSIS — E1165 Type 2 diabetes mellitus with hyperglycemia: Secondary | ICD-10-CM | POA: Diagnosis not present

## 2016-05-07 ENCOUNTER — Ambulatory Visit (INDEPENDENT_AMBULATORY_CARE_PROVIDER_SITE_OTHER): Payer: BLUE CROSS/BLUE SHIELD | Admitting: Podiatry

## 2016-05-07 DIAGNOSIS — M86171 Other acute osteomyelitis, right ankle and foot: Secondary | ICD-10-CM

## 2016-05-07 DIAGNOSIS — L89891 Pressure ulcer of other site, stage 1: Secondary | ICD-10-CM | POA: Diagnosis not present

## 2016-05-07 DIAGNOSIS — L97514 Non-pressure chronic ulcer of other part of right foot with necrosis of bone: Secondary | ICD-10-CM

## 2016-05-07 NOTE — Progress Notes (Addendum)
Subjective: 60 year old female presents the office today postop visit #4 status post right hallux bone biopsy and excisional wound debridement. She completed hyperbaric oxygen therapy. She is also complete her course of antibiotics and she has not had to go back on them. She states occasional bloody drainage comes in the wound. She does continue to call with Dr. Meyer RusselBritto is well. Denies any pus. Denies any increase in swelling or redness of the toe. Denies any systemic complaints such as fevers, chills, nausea, vomiting. No acute changes since last appointment, and no other complaints at this time.   Objective: AAO x3, NAD DP/PT pulses palpable bilaterally, CRT less than 3 seconds Ulceration distal aspect right hallux. Hyperkeratotic lesion overlies the wound. After debridement today the wound measures approximately 0.3 x 0.2 cm with a depth of 50 point for some years. Unable to protective bone. There is no undermining or tunneling. There is no swelling erythema, ascending cellulitis. Trace edema in the toe but is improving. No other open lesions or pre-ulcer lesions. No pain with calf compression, swelling, warmth, erythema  Assessment: Right hallux ulceration, osteomyelitis   Plan: -All treatment options discussed with the patient including all alternatives, risks, complications.  -Will wound was debrided today with a scalpel as well as tissue nipper. As it was applied. Continue to follow up with Dr. Meyer RusselBritto with the wound care center as well. -Paperwork completed today precertification for diabetic shoes. -There is no clinical signs of infection. We'll hold off on any repeat antibiotics. Continue to monitor for any signs or symptoms of infection to the ER should any occur. Follow-up with me in 4 weeks or sooner if needed. Call any questions concerns the meantime. *X-Ray next appointment   Ovid CurdMatthew Wagoner, DPM

## 2016-05-09 DIAGNOSIS — E11621 Type 2 diabetes mellitus with foot ulcer: Secondary | ICD-10-CM | POA: Diagnosis not present

## 2016-05-09 DIAGNOSIS — E11319 Type 2 diabetes mellitus with unspecified diabetic retinopathy without macular edema: Secondary | ICD-10-CM | POA: Diagnosis not present

## 2016-05-09 DIAGNOSIS — M86371 Chronic multifocal osteomyelitis, right ankle and foot: Secondary | ICD-10-CM | POA: Diagnosis not present

## 2016-05-09 DIAGNOSIS — L97512 Non-pressure chronic ulcer of other part of right foot with fat layer exposed: Secondary | ICD-10-CM | POA: Diagnosis not present

## 2016-05-09 DIAGNOSIS — L84 Corns and callosities: Secondary | ICD-10-CM | POA: Diagnosis not present

## 2016-05-09 DIAGNOSIS — E1165 Type 2 diabetes mellitus with hyperglycemia: Secondary | ICD-10-CM | POA: Diagnosis not present

## 2016-05-09 DIAGNOSIS — L97511 Non-pressure chronic ulcer of other part of right foot limited to breakdown of skin: Secondary | ICD-10-CM | POA: Diagnosis not present

## 2016-05-09 DIAGNOSIS — Z794 Long term (current) use of insulin: Secondary | ICD-10-CM | POA: Diagnosis not present

## 2016-05-09 DIAGNOSIS — E1142 Type 2 diabetes mellitus with diabetic polyneuropathy: Secondary | ICD-10-CM | POA: Diagnosis not present

## 2016-05-15 ENCOUNTER — Encounter (HOSPITAL_BASED_OUTPATIENT_CLINIC_OR_DEPARTMENT_OTHER): Payer: BLUE CROSS/BLUE SHIELD | Attending: Surgery

## 2016-05-15 DIAGNOSIS — E11621 Type 2 diabetes mellitus with foot ulcer: Secondary | ICD-10-CM | POA: Insufficient documentation

## 2016-05-15 DIAGNOSIS — Z794 Long term (current) use of insulin: Secondary | ICD-10-CM | POA: Insufficient documentation

## 2016-05-15 DIAGNOSIS — L97519 Non-pressure chronic ulcer of other part of right foot with unspecified severity: Secondary | ICD-10-CM | POA: Insufficient documentation

## 2016-05-15 DIAGNOSIS — E1165 Type 2 diabetes mellitus with hyperglycemia: Secondary | ICD-10-CM | POA: Diagnosis not present

## 2016-05-15 DIAGNOSIS — E1142 Type 2 diabetes mellitus with diabetic polyneuropathy: Secondary | ICD-10-CM | POA: Insufficient documentation

## 2016-05-15 DIAGNOSIS — L97511 Non-pressure chronic ulcer of other part of right foot limited to breakdown of skin: Secondary | ICD-10-CM | POA: Diagnosis not present

## 2016-05-24 DIAGNOSIS — Z794 Long term (current) use of insulin: Secondary | ICD-10-CM | POA: Diagnosis not present

## 2016-05-24 DIAGNOSIS — E1142 Type 2 diabetes mellitus with diabetic polyneuropathy: Secondary | ICD-10-CM | POA: Diagnosis not present

## 2016-05-24 DIAGNOSIS — E1165 Type 2 diabetes mellitus with hyperglycemia: Secondary | ICD-10-CM | POA: Diagnosis not present

## 2016-05-24 DIAGNOSIS — E11621 Type 2 diabetes mellitus with foot ulcer: Secondary | ICD-10-CM | POA: Diagnosis not present

## 2016-05-24 DIAGNOSIS — L97512 Non-pressure chronic ulcer of other part of right foot with fat layer exposed: Secondary | ICD-10-CM | POA: Diagnosis not present

## 2016-05-24 DIAGNOSIS — L97519 Non-pressure chronic ulcer of other part of right foot with unspecified severity: Secondary | ICD-10-CM | POA: Diagnosis not present

## 2016-05-29 DIAGNOSIS — E1165 Type 2 diabetes mellitus with hyperglycemia: Secondary | ICD-10-CM | POA: Diagnosis not present

## 2016-05-29 DIAGNOSIS — E11621 Type 2 diabetes mellitus with foot ulcer: Secondary | ICD-10-CM | POA: Diagnosis not present

## 2016-05-29 DIAGNOSIS — L97519 Non-pressure chronic ulcer of other part of right foot with unspecified severity: Secondary | ICD-10-CM | POA: Diagnosis not present

## 2016-05-29 DIAGNOSIS — Z794 Long term (current) use of insulin: Secondary | ICD-10-CM | POA: Diagnosis not present

## 2016-05-29 DIAGNOSIS — E1142 Type 2 diabetes mellitus with diabetic polyneuropathy: Secondary | ICD-10-CM | POA: Diagnosis not present

## 2016-05-30 DIAGNOSIS — E11621 Type 2 diabetes mellitus with foot ulcer: Secondary | ICD-10-CM | POA: Diagnosis not present

## 2016-05-30 DIAGNOSIS — E1165 Type 2 diabetes mellitus with hyperglycemia: Secondary | ICD-10-CM | POA: Diagnosis not present

## 2016-05-30 DIAGNOSIS — Z794 Long term (current) use of insulin: Secondary | ICD-10-CM | POA: Diagnosis not present

## 2016-05-30 DIAGNOSIS — L97519 Non-pressure chronic ulcer of other part of right foot with unspecified severity: Secondary | ICD-10-CM | POA: Diagnosis not present

## 2016-05-30 DIAGNOSIS — E1142 Type 2 diabetes mellitus with diabetic polyneuropathy: Secondary | ICD-10-CM | POA: Diagnosis not present

## 2016-05-30 DIAGNOSIS — L97512 Non-pressure chronic ulcer of other part of right foot with fat layer exposed: Secondary | ICD-10-CM | POA: Diagnosis not present

## 2016-06-06 DIAGNOSIS — E1142 Type 2 diabetes mellitus with diabetic polyneuropathy: Secondary | ICD-10-CM | POA: Diagnosis not present

## 2016-06-06 DIAGNOSIS — L97519 Non-pressure chronic ulcer of other part of right foot with unspecified severity: Secondary | ICD-10-CM | POA: Diagnosis not present

## 2016-06-06 DIAGNOSIS — Z794 Long term (current) use of insulin: Secondary | ICD-10-CM | POA: Diagnosis not present

## 2016-06-06 DIAGNOSIS — E11621 Type 2 diabetes mellitus with foot ulcer: Secondary | ICD-10-CM | POA: Diagnosis not present

## 2016-06-06 DIAGNOSIS — E1165 Type 2 diabetes mellitus with hyperglycemia: Secondary | ICD-10-CM | POA: Diagnosis not present

## 2016-06-06 DIAGNOSIS — L97511 Non-pressure chronic ulcer of other part of right foot limited to breakdown of skin: Secondary | ICD-10-CM | POA: Diagnosis not present

## 2016-06-08 ENCOUNTER — Ambulatory Visit (INDEPENDENT_AMBULATORY_CARE_PROVIDER_SITE_OTHER): Payer: BLUE CROSS/BLUE SHIELD

## 2016-06-08 ENCOUNTER — Ambulatory Visit (INDEPENDENT_AMBULATORY_CARE_PROVIDER_SITE_OTHER): Payer: BLUE CROSS/BLUE SHIELD | Admitting: Podiatry

## 2016-06-08 VITALS — Temp 97.1°F

## 2016-06-08 DIAGNOSIS — L84 Corns and callosities: Secondary | ICD-10-CM | POA: Diagnosis not present

## 2016-06-08 DIAGNOSIS — M86171 Other acute osteomyelitis, right ankle and foot: Secondary | ICD-10-CM

## 2016-06-08 DIAGNOSIS — Z09 Encounter for follow-up examination after completed treatment for conditions other than malignant neoplasm: Secondary | ICD-10-CM

## 2016-06-10 NOTE — Progress Notes (Signed)
Subjective: 60 year old female presents the office today for follow-up evaluation left humerus ulceration of right hallux. She's been continuing with the wound care center and they state that the wound is healed. The patient that she is remaining in the offloading shoe until she gets her diabetic shoe. She has not noticed any swelling or redness or any warmth the toe were to the foot. She gets a callus over with the wound is present but she denies any open sores identified this time she states that overall she is doing well. She is no longer on any antibiotics. Denies any systemic complaints such as fevers, chills, nausea, vomiting. No acute changes since last appointment, and no other complaints at this time.   Objective: AAO x3, NAD DP/PT pulses palpable bilaterally, CRT less than 3 seconds Hyperkeratotic lesion present at the distal aspect of the right hallux. Upon debridement underlying ulceration appears to be healed. There is no significant edema, erythema, increase in warmth and there is no drainage or pus identified. The wound appears be healed today and doing much better. No other open lesions or other pre-ulcer lesions. No pain with calf compression, swelling, warmth, erythema  Assessment: Right hallux ulceration, osteomyelitis; resolving  Plan: -All treatment options discussed with the patient including all alternatives, risks, complications.  -X-rays were taken today. There does appear to be cortical changes the distal aspect of the hallux however this appears to be the same compared to previous x-rays and there is no extension or worsening of the osteomyelitis. There is no soft tissue emphysema. -Hyperkeratotic lesions/pre-ulcerative lesion was debrided. The wound appears to be healed. -Awaiting diabetic shoes. For now she will continue with her offloading shoe -Continue to monitor for any recurrence of the ulceration to call the office immediately should any occur. I'll see her back  in 9 weeks for routine care and for follow-up.  Ovid CurdMatthew Wagoner, DPM

## 2016-06-13 ENCOUNTER — Encounter (HOSPITAL_BASED_OUTPATIENT_CLINIC_OR_DEPARTMENT_OTHER): Payer: BLUE CROSS/BLUE SHIELD | Attending: Surgery

## 2016-06-13 DIAGNOSIS — E1140 Type 2 diabetes mellitus with diabetic neuropathy, unspecified: Secondary | ICD-10-CM | POA: Diagnosis not present

## 2016-06-13 DIAGNOSIS — I10 Essential (primary) hypertension: Secondary | ICD-10-CM | POA: Insufficient documentation

## 2016-06-13 DIAGNOSIS — Z8631 Personal history of diabetic foot ulcer: Secondary | ICD-10-CM | POA: Diagnosis not present

## 2016-06-13 DIAGNOSIS — Z09 Encounter for follow-up examination after completed treatment for conditions other than malignant neoplasm: Secondary | ICD-10-CM | POA: Insufficient documentation

## 2016-06-25 ENCOUNTER — Other Ambulatory Visit: Payer: Self-pay | Admitting: Internal Medicine

## 2016-06-26 ENCOUNTER — Telehealth: Payer: Self-pay | Admitting: Podiatry

## 2016-06-26 DIAGNOSIS — Z09 Encounter for follow-up examination after completed treatment for conditions other than malignant neoplasm: Secondary | ICD-10-CM | POA: Diagnosis not present

## 2016-06-26 DIAGNOSIS — E1140 Type 2 diabetes mellitus with diabetic neuropathy, unspecified: Secondary | ICD-10-CM | POA: Diagnosis not present

## 2016-06-26 DIAGNOSIS — Z8631 Personal history of diabetic foot ulcer: Secondary | ICD-10-CM | POA: Diagnosis not present

## 2016-06-26 DIAGNOSIS — I10 Essential (primary) hypertension: Secondary | ICD-10-CM | POA: Diagnosis not present

## 2016-06-26 NOTE — Telephone Encounter (Signed)
Pt calling to check the status of her diabetic shoes and it has been several weeks. She sees Dr Sharl MaKerr for her diabete's

## 2016-06-28 ENCOUNTER — Telehealth: Payer: Self-pay | Admitting: *Deleted

## 2016-06-28 NOTE — Telephone Encounter (Signed)
Called patient original paperwork was sent to wrong Dr we are going to submit corrected paperwork to Dr Sharl MaKerr and will be in touch when we receive the signed paperwork,

## 2016-06-28 NOTE — Telephone Encounter (Signed)
Pt. Called checking on her shoes. She said Dr. Meyer RusselBritto has done his part and she is wondering what the hold up is. If the office cant deliver then please let her know so she can go to another office. She has been waiting a long time for the paperwork. Please call her back to let her know what the hold up is, if the insurance company she would like to atleast be told.

## 2016-07-11 ENCOUNTER — Telehealth: Payer: Self-pay | Admitting: *Deleted

## 2016-07-11 NOTE — Telephone Encounter (Signed)
Pt was trying to find out about paperwork for diabetic shoes from Dr. Sharl MaKerr office.

## 2016-08-01 DIAGNOSIS — E1142 Type 2 diabetes mellitus with diabetic polyneuropathy: Secondary | ICD-10-CM | POA: Diagnosis not present

## 2016-08-01 DIAGNOSIS — E538 Deficiency of other specified B group vitamins: Secondary | ICD-10-CM | POA: Diagnosis not present

## 2016-08-01 DIAGNOSIS — Z794 Long term (current) use of insulin: Secondary | ICD-10-CM | POA: Diagnosis not present

## 2016-08-10 ENCOUNTER — Ambulatory Visit (INDEPENDENT_AMBULATORY_CARE_PROVIDER_SITE_OTHER): Payer: BLUE CROSS/BLUE SHIELD | Admitting: Podiatry

## 2016-08-10 ENCOUNTER — Encounter: Payer: Self-pay | Admitting: Podiatry

## 2016-08-10 DIAGNOSIS — Q828 Other specified congenital malformations of skin: Secondary | ICD-10-CM

## 2016-08-10 DIAGNOSIS — E1149 Type 2 diabetes mellitus with other diabetic neurological complication: Secondary | ICD-10-CM

## 2016-08-10 DIAGNOSIS — L84 Corns and callosities: Secondary | ICD-10-CM

## 2016-08-10 NOTE — Progress Notes (Signed)
Subjective: 60 year old female presents the office today for follow-up evaluation left hallux ulceration. She states the wound healed and she was released by the wound care center. She does get a callus of the area. She has not noticed any swelling or redness or any drainage. Denies any pain. She also presents a good measure for diabetic shoes. Denies any systemic complaints such as fevers, chills, nausea, vomiting. No acute changes since last appointment, and no other complaints at this time.   Objective: AAO x3, NAD DP/PT pulses palpable bilaterally, CRT less than 3 seconds Hyperkeratotic lesion present at the distal aspect of the right hallux. Upon debridement underlying ulceration appears to be healed. There is no significant edema, erythema, increase in warmth and there is no drainage or pus identified.  No other open lesions or other pre-ulcer lesions. No pain with calf compression, swelling, warmth, erythema  Assessment: Right hallux result ulceration, infection  Plan: -All treatment options discussed with the patient including all alternatives, risks, complications.  -Pre-ulcerative callus was debrided today without complications or bleeding. -Continue daily foot inspection. -She was measured today for diabetic shoes. -Follow-up in 10 days or sooner if any problems arise. In the meantime, encouraged to call the office with any questions, concerns, change in symptoms.   Ovid CurdMatthew Wagoner, DPM

## 2016-09-10 ENCOUNTER — Telehealth: Payer: Self-pay | Admitting: Podiatry

## 2016-09-10 NOTE — Telephone Encounter (Signed)
Pt calling about diabetic shoes and she has not heard anything. Per pt she is not allowed to go to work until she gets her shoes

## 2016-09-11 LAB — HM DIABETES EYE EXAM

## 2016-09-11 NOTE — Telephone Encounter (Signed)
Patient is coming in on Wednesday 09/12/16 to pick up shoes. Misty StanleyLisa

## 2016-09-12 ENCOUNTER — Ambulatory Visit (INDEPENDENT_AMBULATORY_CARE_PROVIDER_SITE_OTHER): Payer: BLUE CROSS/BLUE SHIELD | Admitting: Podiatry

## 2016-09-12 DIAGNOSIS — L84 Corns and callosities: Secondary | ICD-10-CM | POA: Diagnosis not present

## 2016-09-12 DIAGNOSIS — E1149 Type 2 diabetes mellitus with other diabetic neurological complication: Secondary | ICD-10-CM | POA: Diagnosis not present

## 2016-09-12 DIAGNOSIS — M2040 Other hammer toe(s) (acquired), unspecified foot: Secondary | ICD-10-CM | POA: Diagnosis not present

## 2016-09-12 NOTE — Patient Instructions (Signed)

## 2016-09-12 NOTE — Progress Notes (Signed)
Patient ID: [name] [name], female   DOB: 01/17/1956, 61 y.o.   MRN: [id number]  Patient presents for diabetic shoe pick up, shoes are tried on for good fit.  Patient received 1 Pair Apex [id number] Knit Lace Up Navy/Blue in 7.5 womens and 3 pairs custom molded diabetic inserts.  Verbal and written break in and wear instructions given.  Patient will follow up for scheduled routine care.

## 2016-09-17 ENCOUNTER — Encounter: Payer: Self-pay | Admitting: *Deleted

## 2016-10-12 ENCOUNTER — Ambulatory Visit: Payer: BLUE CROSS/BLUE SHIELD | Admitting: Podiatry

## 2016-10-24 ENCOUNTER — Ambulatory Visit (INDEPENDENT_AMBULATORY_CARE_PROVIDER_SITE_OTHER): Payer: BLUE CROSS/BLUE SHIELD | Admitting: Podiatry

## 2016-10-24 DIAGNOSIS — E1149 Type 2 diabetes mellitus with other diabetic neurological complication: Secondary | ICD-10-CM | POA: Diagnosis not present

## 2016-10-24 DIAGNOSIS — L84 Corns and callosities: Secondary | ICD-10-CM

## 2016-10-24 NOTE — Progress Notes (Signed)
Subjective: 61 year old female presents the office today for Evaluation of right hallux status post bone biopsy for ulceration which eventually healed. She also got new diabetic shoes and she is returning to work about 6 weeks ago and doing well. She does have a callus along the area denies any open sores any drainage or pus or any redness. Denies any systemic complaints such as fevers, chills, nausea, vomiting. No acute changes since last appointment, and no other complaints at this time.   Objective: AAO x3, NAD DP/PT pulses palpable bilaterally, CRT less than 3 seconds Hallux malleus is present on the right side of the distal aspect of the hyperkeratotic lesion. Upon debridement no underlying ulceration, drainage or any signs of infection.  No open lesions or pre-ulcerative lesions.  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  -Hyperkeratotic lesion was debrided 1 without complications or bleeding. -Daily foot inspection. -Continue diabetic shoes. -At some point discussed with her correction of her toe deformity to help prevent some coming back but she wishes to hold off for now. -RTC 3 months or sooner if needed.  -Patient encouraged to call the office with any questions, concerns, change in symptoms.   Ovid CurdMatthew Mordechai Matuszak, DPM

## 2016-11-13 DIAGNOSIS — E538 Deficiency of other specified B group vitamins: Secondary | ICD-10-CM | POA: Diagnosis not present

## 2016-11-13 DIAGNOSIS — E1142 Type 2 diabetes mellitus with diabetic polyneuropathy: Secondary | ICD-10-CM | POA: Diagnosis not present

## 2016-11-13 DIAGNOSIS — Z794 Long term (current) use of insulin: Secondary | ICD-10-CM | POA: Diagnosis not present

## 2016-11-13 DIAGNOSIS — Z8489 Family history of other specified conditions: Secondary | ICD-10-CM | POA: Diagnosis not present

## 2017-01-17 DIAGNOSIS — E1142 Type 2 diabetes mellitus with diabetic polyneuropathy: Secondary | ICD-10-CM | POA: Diagnosis not present

## 2017-01-17 DIAGNOSIS — Z794 Long term (current) use of insulin: Secondary | ICD-10-CM | POA: Diagnosis not present

## 2017-01-17 DIAGNOSIS — Z8489 Family history of other specified conditions: Secondary | ICD-10-CM | POA: Diagnosis not present

## 2017-01-17 DIAGNOSIS — E538 Deficiency of other specified B group vitamins: Secondary | ICD-10-CM | POA: Diagnosis not present

## 2017-01-23 DIAGNOSIS — Z1231 Encounter for screening mammogram for malignant neoplasm of breast: Secondary | ICD-10-CM | POA: Diagnosis not present

## 2017-01-24 ENCOUNTER — Ambulatory Visit: Payer: BLUE CROSS/BLUE SHIELD | Admitting: Podiatry

## 2017-02-04 ENCOUNTER — Ambulatory Visit (INDEPENDENT_AMBULATORY_CARE_PROVIDER_SITE_OTHER): Payer: BLUE CROSS/BLUE SHIELD | Admitting: Podiatry

## 2017-02-04 ENCOUNTER — Encounter: Payer: Self-pay | Admitting: Podiatry

## 2017-02-04 DIAGNOSIS — L84 Corns and callosities: Secondary | ICD-10-CM

## 2017-02-04 DIAGNOSIS — E1149 Type 2 diabetes mellitus with other diabetic neurological complication: Secondary | ICD-10-CM

## 2017-02-04 NOTE — Progress Notes (Signed)
Subjective: 61 year old female presents the office today for follow-up evaluation pre-ulcerative calluses to both of her feet. She states that other than that she's having no concerns and she is doing very well and she has no problems. She denies any open sores or any cracks in the skin or any drainage or open wounds. She is continuing with diabetic shoes. She is return to work working 6 days a week without any problems. Denies any systemic complaints such as fevers, chills, nausea, vomiting. No acute changes since last appointment, and no other complaints at this time.   Objective: AAO x3, NAD DP/PT pulses palpable bilaterally, CRT less than 3 seconds Cavus foot type is present bilaterally with hallux malleus. Hyperkeratotic lesions bilateral submetatarsal one and plantar hallux. Upon debridement there is no underlying ulceration, drainage or any clinical signs of infection. On the right side on the plantar aspect the hallux there is skin fissures present upon debridement there is no open sore identified.  No open lesions or pre-ulcerative lesions.  No pain with calf compression, swelling, warmth, erythema  Assessment: Pre-ulcerative calluses bilateral  Plan: -All treatment options discussed with the patient including all alternatives, risks, complications.  -Lesions are sharply debrided 4 without complications or bleeding. -Continued other issues and offloading insert. She is not wearing this today.  -Daily foot inspection.  -RTC 3-6 months or sooner if any issues are to arise. If at any time are are any changes I encouraged her to call the office immediately should any changes occur.  -Patient encouraged to call the office with any questions, concerns, change in symptoms.   Ovid CurdMatthew Arta Stump, DPM

## 2017-05-09 ENCOUNTER — Ambulatory Visit (INDEPENDENT_AMBULATORY_CARE_PROVIDER_SITE_OTHER): Payer: BLUE CROSS/BLUE SHIELD | Admitting: Podiatry

## 2017-05-09 ENCOUNTER — Encounter: Payer: Self-pay | Admitting: Podiatry

## 2017-05-09 DIAGNOSIS — E1149 Type 2 diabetes mellitus with other diabetic neurological complication: Secondary | ICD-10-CM | POA: Diagnosis not present

## 2017-05-09 DIAGNOSIS — L84 Corns and callosities: Secondary | ICD-10-CM

## 2017-05-09 NOTE — Progress Notes (Signed)
Subjective: 61 year old female presents the office today for follow-up evaluation pre-ulcerative calluses to both of her feet. She states that she's doing well. She has not had any drainage or swelling or any redness that she has noticed. She has no other concerns today. She's been wearing her regular diabetic shoes without any issues. Denies any systemic complaints such as fevers, chills, nausea, vomiting. No acute changes since last appointment, and no other complaints at this time.   Objective: AAO x3, NAD DP/PT pulses palpable bilaterally, CRT less than 3 seconds Cavus foot type is present bilaterally with hallux malleus. Hyperkeratotic lesions bilateral submetatarsal one and plantar hallux. The plantar hallux ulceration measures be more a skin fissure. Upon debridement there is no underlying ulceration, drainage or any clinical signs of infection. On the right side on the plantar aspect the hallux there is skin fissures present upon debridement there is no open sore identified.  No open lesions or pre-ulcerative lesions.  No pain with calf compression, swelling, warmth, erythema  Assessment: Pre-ulcerative calluses bilateral  Plan: -All treatment options discussed with the patient including all alternatives, risks, complications.  -Lesions are sharply debrided 4 without complications or bleeding. -Continued   with offloading.-Daily foot inspection.  -RTC in 9 weeks or sooner if any issues are to arise. If at any time are are any changes I encouraged her to call the office immediately should any changes occur.  -Patient encouraged to call the office with any questions, concerns, change in symptoms.   Ovid Curd, DPM

## 2017-07-11 ENCOUNTER — Encounter: Payer: Self-pay | Admitting: Podiatry

## 2017-07-11 ENCOUNTER — Ambulatory Visit (INDEPENDENT_AMBULATORY_CARE_PROVIDER_SITE_OTHER): Payer: BLUE CROSS/BLUE SHIELD | Admitting: Podiatry

## 2017-07-11 DIAGNOSIS — M2040 Other hammer toe(s) (acquired), unspecified foot: Secondary | ICD-10-CM

## 2017-07-11 DIAGNOSIS — L84 Corns and callosities: Secondary | ICD-10-CM

## 2017-07-11 DIAGNOSIS — Q828 Other specified congenital malformations of skin: Secondary | ICD-10-CM | POA: Diagnosis not present

## 2017-07-11 DIAGNOSIS — E1149 Type 2 diabetes mellitus with other diabetic neurological complication: Secondary | ICD-10-CM | POA: Diagnosis not present

## 2017-07-11 DIAGNOSIS — E119 Type 2 diabetes mellitus without complications: Secondary | ICD-10-CM

## 2017-07-11 NOTE — Progress Notes (Signed)
Subjective: 61 year old female presents the office today for follow-up evaluation pre-ulcerative calluses to her right foot.  She states that she is concerned about an area of a dark blood spot underneath the right big toe which is been ongoing for the last 2 months.  She denies seeing any swelling or drainage or redness or pus.  She is concerned given her history of the wound.  She also gets a skin fissure underneath the crease of her big toe which she also puts moisturizer on but does not help.  She denies any open sores or drainage or pus.  She has no other concerns today. Denies any systemic complaints such as fevers, chills, nausea, vomiting. No acute changes since last appointment, and no other complaints at this time.   Objective: AAO x3, NAD DP/PT pulses palpable bilaterally, CRT less than 3 seconds Sensation decreased with SWMF.  Hallux malleus is present hammertoe contractures are present. On the plantar aspect of the right hallux of the distal aspect is a hyperkeratotic lesion with evidence of dried blood.  Upon debridement this was a superficial and there is no underlying ulceration, drainage or any signs of infection.  On the plantar IPJ of the right hallux is a hyperkeratotic lesion, skin fissuring there is no open sores, drainage or pus or any redness.  Minimal hyperkeratotic lesions right foot submetatarsal 1 and 5 without any underlying ulcerations.  There is no other open lesions or pre-ulcerative lesions identified today.  No pain with calf compression, swelling, warmth, erythema  Assessment: Pre-ulcerative calluses bilateral; with pre-ulcerative callus right plantar hallux  Plan: -All treatment options discussed with the patient including all alternatives, risks, complications.  -Lesions are sharply debrided 4 without complications or bleeding. -Monitor for any skin breakdown. -Paperwork was completed today.  He says diabetic shoes and inserts.  Given her history of wounds,  hammertoe contractures and neuropathy with diabetes I believe that she would benefit from new diabetic inserts and shoes. -I will see her back in the next 3 weeks to make sure that the area has not broken down. Will also schedule with Betha at that time. She agrees  Vivi BarrackMatthew R Wagoner DPM

## 2017-08-01 ENCOUNTER — Encounter: Payer: Self-pay | Admitting: Podiatry

## 2017-08-01 ENCOUNTER — Ambulatory Visit (INDEPENDENT_AMBULATORY_CARE_PROVIDER_SITE_OTHER): Payer: BLUE CROSS/BLUE SHIELD | Admitting: Podiatry

## 2017-08-01 DIAGNOSIS — E1149 Type 2 diabetes mellitus with other diabetic neurological complication: Secondary | ICD-10-CM

## 2017-08-01 DIAGNOSIS — L84 Corns and callosities: Secondary | ICD-10-CM

## 2017-08-01 DIAGNOSIS — M2032 Hallux varus (acquired), left foot: Secondary | ICD-10-CM | POA: Diagnosis not present

## 2017-08-02 NOTE — Progress Notes (Signed)
Subjective: 61 year old female presents the office today for follow-up evaluation pre-ulcerative calluses to her right foot.  She states that she is doing better.  She denies any swelling or redness or any drainage to the toe.  She did notice a small dark spot in the bottom of her toe that she is concerned about otherwise she is doing well.  Is also concerned that the position of the toe.  She has no other concerns today. Denies any systemic complaints such as fevers, chills, nausea, vomiting. No acute changes since last appointment, and no other complaints at this time.   Objective: AAO x3, NAD DP/PT pulses palpable bilaterally, CRT less than 3 seconds Sensation decreased with SWMF.  Hallux malleus is present hammertoe contractures are present. On the plantar aspect of the right hallux of the distal aspect is a hyperkeratotic lesion with evidence of dried blood.  This appear to be a scab.  Upon debridement there is no underlying ulceration or skin breakdown.  Along the plantar IPJ of the hallux is a skin fissure which is actually improved compared to last appointment there is no skin breakdown.  On the medial aspect longitudinally there is also a small area of superficial skin breakdown which is new today which also appears to be a skin tear.  There is no drainage or pus there is no redness or swelling.  There is no clinical signs of infection noted.  Hallux malleus is present. There is no other open lesions or pre-ulcerative lesions identified today.  No pain with calf compression, swelling, warmth, erythema  Assessment: Pre-ulcerative calluses bilateral; with pre-ulcerative callus right plantar hallux  Plan: -All treatment options discussed with the patient including all alternatives, risks, complications.  -Sharply debrided the pre-ulcerative callus so that any complications or bleeding.  Recommend small amount of antibiotic ointment on the area of the skin tearing.  There is no clinical signs  of infection noted today but continue to monitor closely.  Continue offloading at all times.  She states that she does not with the diabetic shoes and inserts all the time.  Discussed with her that she use the cushion and offloading. -We discussed surgical intervention to help straighten out the hallux.  She said that she does not want any screws or hardware inside the toe.  We discussed an IPJ arthroplasty of the right hallux.  I think it would be appropriate at this time given the continuation of the pre-ulcerative callus of both the skin fissure and the issue she is having to the toe.  She will consider her options. -Monitor for any clinical signs or symptoms of infection and directed to call the office immediately should any occur or go to the ER. -She agrees this plan has no further questions.  Vivi BarrackMatthew R Hector Venne DPM

## 2017-08-27 ENCOUNTER — Other Ambulatory Visit: Payer: BLUE CROSS/BLUE SHIELD

## 2017-08-29 ENCOUNTER — Ambulatory Visit: Payer: BLUE CROSS/BLUE SHIELD | Admitting: Podiatry

## 2017-09-13 ENCOUNTER — Telehealth: Payer: Self-pay | Admitting: Podiatry

## 2017-09-13 NOTE — Telephone Encounter (Signed)
Left message that pt was already scheduled to see Dr Ardelle AntonWagoner on 2.7 and I added her to see Raiford NobleRick for diabetic shoe measurements. I will need a copy of insurance cards so I can check for coverage since pt has bcbs so I have asked on this measge for pt to bring updated cards with her.

## 2017-09-19 ENCOUNTER — Ambulatory Visit: Payer: BLUE CROSS/BLUE SHIELD | Admitting: Orthotics

## 2017-09-19 ENCOUNTER — Ambulatory Visit (INDEPENDENT_AMBULATORY_CARE_PROVIDER_SITE_OTHER): Payer: BLUE CROSS/BLUE SHIELD | Admitting: Podiatry

## 2017-09-19 DIAGNOSIS — E1149 Type 2 diabetes mellitus with other diabetic neurological complication: Secondary | ICD-10-CM | POA: Diagnosis not present

## 2017-09-19 DIAGNOSIS — M2032 Hallux varus (acquired), left foot: Secondary | ICD-10-CM | POA: Diagnosis not present

## 2017-09-19 DIAGNOSIS — L84 Corns and callosities: Secondary | ICD-10-CM | POA: Diagnosis not present

## 2017-09-23 NOTE — Progress Notes (Signed)
Subjective: 62 year old female presents the office for follow-up evaluation of pre-ulcerative right big toe.  She states that she been drinking moisturizing the area and she denies any skin breakdown or ulcerations.  She denies any swelling redness or any drainage or any other signs of infection.  She is also getting measured for new diabetic shoes and inserts today.  She has no other concerns.  No recent injury or changes. Denies any systemic complaints such as fevers, chills, nausea, vomiting. No acute changes since last appointment, and no other complaints at this time.   Objective: AAO x3, NAD DP/PT pulses palpable bilaterally, CRT less than 3 seconds Hallux malleus is present on the right hallux.  The distal plantar aspect of the toe and the area of previous ulceration there is a hyperkeratotic lesion.  Also skin fissure present on the plantar hallux IPJ.  Upon debridement of both of these areas there is no underlying ulceration, drainage or any clinical signs of infection noted.  There is no edema to the toe or erythema. No open lesions or pre-ulcerative lesions.  No pain with calf compression, swelling, warmth, erythema  Assessment: Hyperkeratotic lesions right hallux due to digital deformity  Plan: -All treatment options discussed with the patient including all alternatives, risks, complications.  -Sharply debrided the hyperkeratotic lesions of the right hallux x2 without any complications or bleeding.  There is no signs of infection or ulceration.  She was measured for new inserts today.  Also discussed surgical intervention to help straighten the toe.  She will continue to think about this but wishes to hold off on that monitoring signs or symptoms of infection. -Patient encouraged to call the office with any questions, concerns, change in symptoms.   Vivi BarrackMatthew R Keenan Trefry DPM

## 2017-10-21 ENCOUNTER — Telehealth: Payer: Self-pay | Admitting: Podiatry

## 2017-10-21 NOTE — Telephone Encounter (Signed)
Pt scheduled for 3.12.19 to puds but we do not have the shoes in yet only the inserts...please call to confirm.

## 2017-10-22 ENCOUNTER — Ambulatory Visit: Payer: BLUE CROSS/BLUE SHIELD | Admitting: Orthotics

## 2017-11-05 DIAGNOSIS — Z008 Encounter for other general examination: Secondary | ICD-10-CM | POA: Diagnosis not present

## 2017-11-05 DIAGNOSIS — Z719 Counseling, unspecified: Secondary | ICD-10-CM | POA: Diagnosis not present

## 2017-11-05 DIAGNOSIS — E1140 Type 2 diabetes mellitus with diabetic neuropathy, unspecified: Secondary | ICD-10-CM | POA: Diagnosis not present

## 2017-11-05 DIAGNOSIS — I10 Essential (primary) hypertension: Secondary | ICD-10-CM | POA: Diagnosis not present

## 2017-11-05 DIAGNOSIS — E663 Overweight: Secondary | ICD-10-CM | POA: Diagnosis not present

## 2017-11-21 ENCOUNTER — Ambulatory Visit (INDEPENDENT_AMBULATORY_CARE_PROVIDER_SITE_OTHER): Payer: BLUE CROSS/BLUE SHIELD | Admitting: Podiatry

## 2017-11-21 DIAGNOSIS — L84 Corns and callosities: Secondary | ICD-10-CM | POA: Diagnosis not present

## 2017-11-21 DIAGNOSIS — M2040 Other hammer toe(s) (acquired), unspecified foot: Secondary | ICD-10-CM

## 2017-11-21 DIAGNOSIS — E1149 Type 2 diabetes mellitus with other diabetic neurological complication: Secondary | ICD-10-CM | POA: Diagnosis not present

## 2017-11-21 DIAGNOSIS — M2032 Hallux varus (acquired), left foot: Secondary | ICD-10-CM | POA: Diagnosis not present

## 2017-11-21 NOTE — Progress Notes (Signed)
Subjective: 62 year old female presents the office today for evaluation of a hyperkeratotic lesion to the plantar aspect of the right hallux.  She states that she been keeping moisturizer on the toe has been doing much better but there is a small fissure within the skin but denies any drainage or pus or any swelling or redness.  She has no other concerns. Denies any systemic complaints such as fevers, chills, nausea, vomiting. No acute changes since last appointment, and no other complaints at this time.   Objective: AAO x3, NAD DP/PT pulses palpable bilaterally, CRT less than 3 seconds Hallux malleus is present of the right hallux.  The distal portion is a hyperkeratotic lesion there is a central skin fissure but there is no drainage or pus.  This appears to be very superficial area.  Also hyperkeratotic tissue within the plantar aspect of the IPJ along the sulcus.  No underlying ulceration drainage or any signs of infection. No open lesions or pre-ulcerative lesions.  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  -I debrided the hyperkeratotic lesion, skin fissure to the any complications or bleeding.  This was very superficial there is no signs of infection.  Recommended a small amount of antibiotic ointment and a dressing daily.  Once present her symptoms of infection. -Rick dispensed diabetic shoes.  Break instructions were discussed.  -Patient encouraged to call the office with any questions, concerns, change in symptoms.   Vivi BarrackMatthew R Wagoner DPM

## 2017-11-25 DIAGNOSIS — Z794 Long term (current) use of insulin: Secondary | ICD-10-CM | POA: Diagnosis not present

## 2017-11-25 DIAGNOSIS — E1142 Type 2 diabetes mellitus with diabetic polyneuropathy: Secondary | ICD-10-CM | POA: Diagnosis not present

## 2017-11-25 DIAGNOSIS — Z8489 Family history of other specified conditions: Secondary | ICD-10-CM | POA: Diagnosis not present

## 2017-11-25 DIAGNOSIS — E538 Deficiency of other specified B group vitamins: Secondary | ICD-10-CM | POA: Diagnosis not present

## 2017-12-12 ENCOUNTER — Ambulatory Visit: Payer: BLUE CROSS/BLUE SHIELD | Admitting: Podiatry

## 2017-12-16 ENCOUNTER — Ambulatory Visit (INDEPENDENT_AMBULATORY_CARE_PROVIDER_SITE_OTHER): Payer: BLUE CROSS/BLUE SHIELD | Admitting: Family Medicine

## 2017-12-16 ENCOUNTER — Encounter: Payer: Self-pay | Admitting: Family Medicine

## 2017-12-16 VITALS — BP 138/76 | Temp 98.2°F | Ht 62.0 in | Wt 153.0 lb

## 2017-12-16 DIAGNOSIS — J029 Acute pharyngitis, unspecified: Secondary | ICD-10-CM

## 2017-12-16 DIAGNOSIS — I889 Nonspecific lymphadenitis, unspecified: Secondary | ICD-10-CM | POA: Diagnosis not present

## 2017-12-16 LAB — POCT RAPID STREP A (OFFICE): RAPID STREP A SCREEN: NEGATIVE

## 2017-12-16 MED ORDER — AZITHROMYCIN 250 MG PO TABS: ORAL_TABLET | ORAL | 0 refills | Status: DC

## 2017-12-16 NOTE — Progress Notes (Signed)
   Subjective:    Patient ID: [name] [name], female    DOB: 01/17/1956, 62 y.o.   MRN: [id number]  Sinusitis  This is a new problem. Episode onset: 5 days. Associated symptoms include coughing, ear pain and a sore throat. (Vomiting) Treatments tried: advil.    Las wed throat got siore, thougt it might  Be pollen  Then got wose thru the week last wk  Pain was shooting thru the ear  Throat was inflammed and pus pockets on frid   ulcr left side of tongue  No fever  No know n exposure      Review of Systems  HENT: Positive for ear pain and sore throat.   Respiratory: Positive for cough.        Objective:   Physical Exam Alert active good hydrationa lungs clear.  Heart regular rate and rhythm.  HEENT pharynx erythematous positive aphthous ulceration.  Lateral tongue.  And posterior pharynx.  Some erythema mild swelling.  Left anterior cervical mildly swollen or tender       Assessment & Plan:  Impression pharyngitis with secondary cervical lymph1 lymphadenitis plan antibiotics prescribed symptom care discussed.  Warning signs discussed

## 2017-12-17 LAB — SPECIMEN STATUS REPORT

## 2017-12-17 LAB — STREP A DNA PROBE: Strep Gp A Direct, DNA Probe: NEGATIVE

## 2018-01-14 DIAGNOSIS — Z719 Counseling, unspecified: Secondary | ICD-10-CM | POA: Diagnosis not present

## 2018-01-14 DIAGNOSIS — E1140 Type 2 diabetes mellitus with diabetic neuropathy, unspecified: Secondary | ICD-10-CM | POA: Diagnosis not present

## 2018-01-14 DIAGNOSIS — E663 Overweight: Secondary | ICD-10-CM | POA: Diagnosis not present

## 2018-01-14 DIAGNOSIS — Z008 Encounter for other general examination: Secondary | ICD-10-CM | POA: Diagnosis not present

## 2018-02-24 ENCOUNTER — Emergency Department (HOSPITAL_COMMUNITY): Payer: BLUE CROSS/BLUE SHIELD

## 2018-02-24 ENCOUNTER — Observation Stay (HOSPITAL_BASED_OUTPATIENT_CLINIC_OR_DEPARTMENT_OTHER): Payer: BLUE CROSS/BLUE SHIELD

## 2018-02-24 ENCOUNTER — Observation Stay (HOSPITAL_COMMUNITY)
Admission: EM | Admit: 2018-02-24 | Discharge: 2018-02-25 | Disposition: A | Discharge status: Home / Self Care | Payer: BLUE CROSS/BLUE SHIELD | Attending: Internal Medicine | Admitting: Internal Medicine

## 2018-02-24 ENCOUNTER — Encounter (HOSPITAL_COMMUNITY): Payer: Self-pay | Admitting: Emergency Medicine

## 2018-02-24 ENCOUNTER — Other Ambulatory Visit: Payer: Self-pay

## 2018-02-24 ENCOUNTER — Observation Stay (HOSPITAL_COMMUNITY): Payer: BLUE CROSS/BLUE SHIELD

## 2018-02-24 DIAGNOSIS — I10 Essential (primary) hypertension: Secondary | ICD-10-CM

## 2018-02-24 DIAGNOSIS — I639 Cerebral infarction, unspecified: Secondary | ICD-10-CM | POA: Diagnosis not present

## 2018-02-24 DIAGNOSIS — Z794 Long term (current) use of insulin: Secondary | ICD-10-CM | POA: Insufficient documentation

## 2018-02-24 DIAGNOSIS — Z79899 Other long term (current) drug therapy: Secondary | ICD-10-CM | POA: Insufficient documentation

## 2018-02-24 DIAGNOSIS — R4020 Unspecified coma: Secondary | ICD-10-CM | POA: Diagnosis not present

## 2018-02-24 DIAGNOSIS — I6523 Occlusion and stenosis of bilateral carotid arteries: Secondary | ICD-10-CM | POA: Diagnosis not present

## 2018-02-24 DIAGNOSIS — N390 Urinary tract infection, site not specified: Secondary | ICD-10-CM

## 2018-02-24 DIAGNOSIS — E119 Type 2 diabetes mellitus without complications: Secondary | ICD-10-CM | POA: Diagnosis not present

## 2018-02-24 DIAGNOSIS — R270 Ataxia, unspecified: Secondary | ICD-10-CM | POA: Diagnosis not present

## 2018-02-24 DIAGNOSIS — R51 Headache: Secondary | ICD-10-CM | POA: Diagnosis not present

## 2018-02-24 DIAGNOSIS — I371 Nonrheumatic pulmonary valve insufficiency: Secondary | ICD-10-CM | POA: Diagnosis not present

## 2018-02-24 DIAGNOSIS — R200 Anesthesia of skin: Secondary | ICD-10-CM | POA: Diagnosis not present

## 2018-02-24 HISTORY — DX: Cerebral infarction, unspecified: I63.9

## 2018-02-24 HISTORY — DX: Type 2 diabetes mellitus without complications: E11.9

## 2018-02-24 LAB — BASIC METABOLIC PANEL
Anion gap: 11 (ref 5–15)
BUN: 11 mg/dL (ref 8–23)
CO2: 25 mmol/L (ref 22–32)
Calcium: 9.4 mg/dL (ref 8.9–10.3)
Chloride: 105 mmol/L (ref 98–111)
Creatinine, Ser: 0.62 mg/dL (ref 0.44–1.00)
GFR calc Af Amer: >60 mL/min (ref >60)
GFR calc non Af Amer: >60 mL/min (ref >60)
Glucose, Bld: 177 mg/dL — ABNORMAL HIGH (ref 70–99)
Potassium: 3.7 mmol/L (ref 3.5–5.1)
Sodium: 141 mmol/L (ref 135–145)

## 2018-02-24 LAB — CBC WITH DIFFERENTIAL/PLATELET
Basophils Absolute: 0.1 10*3/uL (ref 0.0–0.1)
Basophils Relative: 1 %
Eosinophils Absolute: 0.4 10*3/uL (ref 0.0–0.7)
Eosinophils Relative: 6 %
HCT: 41.7 % (ref 36.0–46.0)
Hemoglobin: 13.6 g/dL (ref 12.0–15.0)
Lymphocytes Relative: 32 %
Lymphs Abs: 2.4 10*3/uL (ref 0.7–4.0)
MCH: 27.2 pg (ref 26.0–34.0)
MCHC: 32.6 g/dL (ref 30.0–36.0)
MCV: 83.4 fL (ref 78.0–100.0)
Monocytes Absolute: 0.8 10*3/uL (ref 0.1–1.0)
Monocytes Relative: 11 %
Neutro Abs: 3.7 10*3/uL (ref 1.7–7.7)
Neutrophils Relative %: 50 %
Platelets: 254 10*3/uL (ref 150–400)
RBC: 5 MIL/uL (ref 3.87–5.11)
RDW: 15 % (ref 11.5–15.5)
WBC: 7.3 10*3/uL (ref 4.0–10.5)

## 2018-02-24 LAB — URINALYSIS, ROUTINE W REFLEX MICROSCOPIC
Bilirubin Urine: NEGATIVE
Glucose, UA: 50 mg/dL — AB
Ketones, ur: NEGATIVE mg/dL
Nitrite: NEGATIVE
Protein, ur: NEGATIVE mg/dL
Specific Gravity, Urine: 1.008 (ref 1.005–1.030)
WBC, UA: >50 WBC/hpf — ABNORMAL HIGH (ref 0–5)
pH: 6 (ref 5.0–8.0)

## 2018-02-24 LAB — ECHOCARDIOGRAM COMPLETE
Height: 62 in
WEIGHTICAEL: 2480 [oz_av]

## 2018-02-24 LAB — GLUCOSE, CAPILLARY
GLUCOSE-CAPILLARY: 117 mg/dL — AB (ref 70–99)
GLUCOSE-CAPILLARY: 219 mg/dL — AB (ref 70–99)
Glucose-Capillary: 160 mg/dL — ABNORMAL HIGH (ref 70–99)

## 2018-02-24 LAB — CBG MONITORING, ED: GLUCOSE-CAPILLARY: 179 mg/dL — AB (ref 70–99)

## 2018-02-24 LAB — TSH: TSH: 1.96 u[IU]/mL (ref 0.350–4.500)

## 2018-02-24 MED ORDER — ATORVASTATIN CALCIUM 40 MG PO TABS
80.0000 mg | ORAL_TABLET | Freq: Every day | ORAL | Status: DC
  Administered 2018-02-24: 80 mg via ORAL
  Filled 2018-02-24: qty 2

## 2018-02-24 MED ORDER — STROKE: EARLY STAGES OF RECOVERY BOOK
Freq: Once | Status: AC
  Administered 2018-02-24: 13:00:00
  Filled 2018-02-24 (×2): qty 1

## 2018-02-24 MED ORDER — ACETAMINOPHEN 325 MG PO TABS: 650.0000 mg | ORAL_TABLET | ORAL | Status: DC | PRN

## 2018-02-24 MED ORDER — ASPIRIN 81 MG PO CHEW
324.0000 mg | CHEWABLE_TABLET | Freq: Once | ORAL | Status: AC
  Administered 2018-02-24: 324 mg via ORAL
  Filled 2018-02-24: qty 4

## 2018-02-24 MED ORDER — SODIUM CHLORIDE 0.9 % IV SOLN
1.0000 g | INTRAVENOUS | Status: DC
  Administered 2018-02-25: 1 g via INTRAVENOUS
  Filled 2018-02-24: qty 10
  Filled 2018-02-24: qty 1

## 2018-02-24 MED ORDER — ASPIRIN 300 MG RE SUPP: 300.0000 mg | Freq: Every day | RECTAL | Status: DC

## 2018-02-24 MED ORDER — SODIUM CHLORIDE 0.9 % IV SOLN
1.0000 g | Freq: Once | INTRAVENOUS | Status: AC
  Administered 2018-02-24: 1 g via INTRAVENOUS
  Filled 2018-02-24: qty 10

## 2018-02-24 MED ORDER — SODIUM CHLORIDE 0.9 % IV SOLN
INTRAVENOUS | Status: AC
  Administered 2018-02-24: 12:00:00 via INTRAVENOUS

## 2018-02-24 MED ORDER — LABETALOL HCL 5 MG/ML IV SOLN
10.0000 mg | Freq: Once | INTRAVENOUS | Status: AC
  Administered 2018-02-24: 10 mg via INTRAVENOUS
  Filled 2018-02-24: qty 4

## 2018-02-24 MED ORDER — INSULIN GLARGINE 100 UNIT/ML ~~LOC~~ SOLN
26.0000 [IU] | Freq: Every day | SUBCUTANEOUS | Status: DC
  Administered 2018-02-24: 26 [IU] via SUBCUTANEOUS
  Filled 2018-02-24 (×2): qty 0.26

## 2018-02-24 MED ORDER — INSULIN ASPART 100 UNIT/ML ~~LOC~~ SOLN
0.0000 [IU] | Freq: Every day | SUBCUTANEOUS | Status: DC
  Administered 2018-02-24: 2 [IU] via SUBCUTANEOUS

## 2018-02-24 MED ORDER — GUAIFENESIN-DM 100-10 MG/5ML PO SYRP
5.0000 mL | ORAL_SOLUTION | ORAL | Status: DC | PRN
  Administered 2018-02-24: 5 mL via ORAL
  Filled 2018-02-24: qty 5

## 2018-02-24 MED ORDER — ACETAMINOPHEN 650 MG RE SUPP: 650.0000 mg | RECTAL | Status: DC | PRN

## 2018-02-24 MED ORDER — ACETAMINOPHEN 160 MG/5ML PO SOLN: 650.0000 mg | ORAL | Status: DC | PRN

## 2018-02-24 MED ORDER — ASPIRIN 325 MG PO TABS
325.0000 mg | ORAL_TABLET | Freq: Every day | ORAL | Status: DC
  Administered 2018-02-25: 325 mg via ORAL
  Filled 2018-02-24: qty 1

## 2018-02-24 MED ORDER — INSULIN ASPART 100 UNIT/ML ~~LOC~~ SOLN
0.0000 [IU] | Freq: Three times a day (TID) | SUBCUTANEOUS | Status: DC
  Administered 2018-02-24: 2 [IU] via SUBCUTANEOUS
  Administered 2018-02-25: 7 [IU] via SUBCUTANEOUS
  Administered 2018-02-25: 2 [IU] via SUBCUTANEOUS

## 2018-02-24 MED ORDER — ENOXAPARIN SODIUM 40 MG/0.4ML ~~LOC~~ SOLN
40.0000 mg | SUBCUTANEOUS | Status: DC
  Administered 2018-02-24 – 2018-02-25 (×2): 40 mg via SUBCUTANEOUS
  Filled 2018-02-24 (×2): qty 0.4

## 2018-02-24 NOTE — Progress Notes (Signed)
*  PRELIMINARY RESULTS* Echocardiogram 2D Echocardiogram has been performed.  Stacey DrainWhite, Sabine Tenenbaum J 02/24/2018, 2:21 PM

## 2018-02-24 NOTE — H&P (Signed)
History and Physical    BEADIE MATSUNAGA EAV:[id number] DOB: 12/25/1955 DOA: 02/24/2018  PCP: Merlyn Albert, MD   Patient coming from: Home  Chief Complaint: Right facial numbness  HPI: BRYCELYN GAMBINO is a 62 y.o. female with medical history significant for type 2 diabetes, and hypertension who reports some poor compliance with home blood pressure medications.  She presented to the ED with complaints of right-sided facial numbness and tingling that woke her up this morning at approximately 5:50 AM.  She states that much of this sensation was underneath her right eye and she did not quite feel well.  On her way to the ED she had trouble with her balance and thus, trouble ambulating.  She denies any specific focal weakness to her arms or legs and denies any headache.  She also has some diplopia when she leaves both of her eyes open and is alleviated when she only uses one eye to see. She denies any fever, chills, chest pain, shortness of breath, abdominal pain, or dysuria.   ED Course: Vital signs are stable and labs demonstrate glucose of 177.  EKG with normal sinus rhythm.  MRI/MRA brain with acute posterior pons CVA noted.  UA demonstrates findings significant for UTI for which she has been started on Rocephin.  Patient has also been given a dose of labetalol with elevated blood pressure readings and a full dose aspirin.  Review of Systems: All others reviewed and otherwise negative.  Past Medical History:  Diagnosis Date  . Hypertension   . Microproteinuria   . Neuropathy   . Noncompliance   . Osteopenia   . Type 2 diabetes mellitus (HCC)     Past Surgical History:  Procedure Laterality Date  . ABDOMINAL HYSTERECTOMY    . BACK SURGERY       reports that she has never smoked. She has never used smokeless tobacco. She reports that she does not drink alcohol or use drugs.  No Known Allergies  Family History  Problem Relation Age of Onset  . Diabetes Mother     Prior to  Admission medications   Medication Sig Start Date End Date Taking? Authorizing Provider  Cyanocobalamin (B-12 PO) Take 2 tablets by mouth daily.    Yes [provider]  Insulin Glargine (LANTUS SOLOSTAR) 100 UNIT/ML Solostar Pen Inject 52 Units into the skin at bedtime.    Yes [provider]  metFORMIN (GLUCOPHAGE-XR) 500 MG 24 hr tablet Take 1 tablet by mouth 4 (four) times daily. 02/02/18  Yes [provider]  Multiple Vitamins-Minerals (ONE-A-DAY WOMENS VITACRAVES) CHEW Chew 1 tablet by mouth daily.   Yes [provider]  azithromycin (ZITHROMAX Z-PAK) 250 MG tablet Take 2 tablets (500 mg) on  Day 1,  followed by 1 tablet (250 mg) once daily on Days 2 through 5. Patient not taking: Reported on 02/24/2018 12/16/17   Merlyn Albert, MD  lisinopril (PRINIVIL,ZESTRIL) 10 MG tablet  11/25/17   [provider]    Physical Exam: Vitals:   02/24/18 0709 02/24/18 0730 02/24/18 0745 02/24/18 0800  BP: (!) 209/83 (!) 204/93  (!) 159/75  Pulse: 69 86 66 71  Resp: (!) 8  20   Temp: 97.9 F (36.6 C)     TempSrc: Oral     SpO2: 99% 98% 98% 98%  Weight:      Height:        Constitutional: NAD, calm, comfortable Vitals:   02/24/18 0709 02/24/18 0730 02/24/18 0745 02/24/18  0800  BP: (!) 209/83 (!) 204/93  (!) 159/75  Pulse: 69 86 66 71  Resp: (!) 8  20   Temp: 97.9 F (36.6 C)     TempSrc: Oral     SpO2: 99% 98% 98% 98%  Weight:      Height:       Eyes: lids and conjunctivae normal ENMT: Mucous membranes are moist.  Neck: normal, supple Respiratory: clear to auscultation bilaterally. Normal respiratory effort. No accessory muscle use.  Cardiovascular: Regular rate and rhythm, no murmurs. No extremity edema. Abdomen: no tenderness, no distention. Bowel sounds positive.  Musculoskeletal:  No joint deformity upper and lower extremities.   Skin: no rashes, lesions, ulcers.  Psychiatric: Normal judgment and insight. Alert and oriented x 3. Normal  mood.  Neurologic: Cranial nerves II through XII appear to be grossly intact although she does have diplopia.  Extremities with 5/5 motor strength bilaterally and sensation to light touch intact bilaterally and in facial region.  Labs on Admission: I have personally reviewed following labs and imaging studies  CBC: Recent Labs  Lab 02/24/18 0757  WBC 7.3  NEUTROABS 3.7  HGB 13.6  HCT 41.7  MCV 83.4  PLT 254   Basic Metabolic Panel: Recent Labs  Lab 02/24/18 0757  NA 141  K 3.7  CL 105  CO2 25  GLUCOSE 177*  BUN 11  CREATININE 0.62  CALCIUM 9.4   GFR: Estimated Creatinine Clearance: 65.3 mL/min (by C-G formula based on SCr of 0.62 mg/dL). Liver Function Tests: No results for input(s): AST, ALT, ALKPHOS, BILITOT, PROT, ALBUMIN in the last 168 hours. No results for input(s): LIPASE, AMYLASE in the last 168 hours. No results for input(s): AMMONIA in the last 168 hours. Coagulation Profile: No results for input(s): INR, PROTIME in the last 168 hours. Cardiac Enzymes: No results for input(s): CKTOTAL, CKMB, CKMBINDEX, TROPONINI in the last 168 hours. BNP (last 3 results) No results for input(s): PROBNP in the last 8760 hours. HbA1C: No results for input(s): HGBA1C in the last 72 hours. CBG: Recent Labs  Lab 02/24/18 0707  GLUCAP 179*   Lipid Profile: No results for input(s): CHOL, HDL, LDLCALC, TRIG, CHOLHDL, LDLDIRECT in the last 72 hours. Thyroid Function Tests: No results for input(s): TSH, T4TOTAL, FREET4, T3FREE, THYROIDAB in the last 72 hours. Anemia Panel: No results for input(s): VITAMINB12, FOLATE, FERRITIN, TIBC, IRON, RETICCTPCT in the last 72 hours. Urine analysis:    Component Value Date/Time   COLORURINE YELLOW 02/24/2018 0738   APPEARANCEUR HAZY (A) 02/24/2018 0738   LABSPEC 1.008 02/24/2018 0738   PHURINE 6.0 02/24/2018 0738   GLUCOSEU 50 (A) 02/24/2018 0738   HGBUR SMALL (A) 02/24/2018 0738   BILIRUBINUR NEGATIVE 02/24/2018 0738   KETONESUR  NEGATIVE 02/24/2018 0738   PROTEINUR NEGATIVE 02/24/2018 0738   NITRITE NEGATIVE 02/24/2018 0738   LEUKOCYTESUR LARGE (A) 02/24/2018 0738    Radiological Exams on Admission: Ct Head Wo Contrast  Result Date: 02/24/2018 CLINICAL DATA:  Left-sided facial numbness. EXAM: CT HEAD WITHOUT CONTRAST TECHNIQUE: Contiguous axial images were obtained from the base of the skull through the vertex without intravenous contrast. COMPARISON:  None. FINDINGS: Brain: No evidence of acute infarction, hemorrhage, hydrocephalus, extra-axial collection or mass lesion/mass effect. Vascular: No hyperdense vessel or unexpected calcification. Skull: Normal. Negative for fracture or focal lesion. Sinuses/Orbits: No acute finding. Other: None. IMPRESSION: Normal head CT. Electronically Signed   By: Irish Lack M.D.   On: 02/24/2018 08:32   [facility]  Wo Contrast  Result Date: 02/24/2018 CLINICAL DATA:  Blurred vision altered consciousness EXAM: MRI HEAD WITHOUT CONTRAST MRA HEAD WITHOUT CONTRAST TECHNIQUE: Multiplanar, multiecho pulse sequences of the brain and surrounding structures were obtained without intravenous contrast. Angiographic images of the head were obtained using MRA technique without contrast. COMPARISON:  CT head 02/24/2018 FINDINGS: MRI HEAD FINDINGS Brain: Small acute infarct in the posterior pons at the floor of the fourth ventricle measuring approximately 3 x 5 mm. No other acute infarct Chronic infarcts in the thalamus and pons bilaterally. Mild chronic microvascular ischemia in the white matter. Negative for hemorrhage mass or edema. No midline shift. Vascular: Normal arterial flow voids Skull and upper cervical spine: Negative Sinuses/Orbits: Mild mucosal edema paranasal sinuses.  Normal orbit Other: None MRA HEAD FINDINGS Cavernous carotid widely patent bilaterally with mild atherosclerotic irregularity. Anterior and middle cerebral arteries patent bilaterally without stenosis or occlusion Both  vertebral arteries patent to the basilar. Basilar widely patent. PICA and superior cerebellar arteries patent bilaterally. Mild stenosis right posterior cerebral artery and moderate stenosis left posterior cerebral artery Negative for cerebral aneurysm IMPRESSION: Small acute infarct posterior pons. Moderate chronic microvascular ischemic change No emergent large vessel occlusion. 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 Electronically Signed   By: Marlan Palau M.D.   On: 02/24/2018 10:26   Mr Brain Wo Contrast  Result Date: 02/24/2018 CLINICAL DATA:  Blurred vision altered consciousness EXAM: MRI HEAD WITHOUT CONTRAST MRA HEAD WITHOUT CONTRAST TECHNIQUE: Multiplanar, multiecho pulse sequences of the brain and surrounding structures were obtained without intravenous contrast. Angiographic images of the head were obtained using MRA technique without contrast. COMPARISON:  CT head 02/24/2018 FINDINGS: MRI HEAD FINDINGS Brain: Small acute infarct in the posterior pons at the floor of the fourth ventricle measuring approximately 3 x 5 mm. No other acute infarct Chronic infarcts in the thalamus and pons bilaterally. Mild chronic microvascular ischemia in the white matter. Negative for hemorrhage mass or edema. No midline shift. Vascular: Normal arterial flow voids Skull and upper cervical spine: Negative Sinuses/Orbits: Mild mucosal edema paranasal sinuses.  Normal orbit Other: None MRA HEAD FINDINGS Cavernous carotid widely patent bilaterally with mild atherosclerotic irregularity. Anterior and middle cerebral arteries patent bilaterally without stenosis or occlusion Both vertebral arteries patent to the basilar. Basilar widely patent. PICA and superior cerebellar arteries patent bilaterally. Mild stenosis right posterior cerebral artery and moderate stenosis left posterior cerebral artery Negative for cerebral aneurysm IMPRESSION: Small acute infarct posterior pons. Moderate chronic  microvascular ischemic change No emergent large vessel occlusion. 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 Electronically Signed   By: Marlan Palau M.D.   On: 02/24/2018 10:26    EKG: Independently reviewed. NSR at 69bpm  Assessment/Plan Principal Problem:   CVA (cerebral vascular accident) [facility]) Active Problems:   Essential hypertension   Diabetes (HCC)   Acute lower UTI    1. Small, acute posterior pons CVA.  This was demonstrated on MRI with no LVO on MRA.  Continue on full dose aspirin with PT/OT evaluation and fall precautions.  Swallow evaluation prior to diet initiation.  Carotid ultrasound and 2D echocardiogram per stroke order set.  Check lipid panel, TSH, and hemoglobin A1c.  Atorvastatin 80 mg daily.  Appreciate neurology evaluation and recommendations. 2. UTI.  Maintain on IV Rocephin and follow urine cultures. 3. Type 2 diabetes with mild hyperglycemia.  Placed on sliding scale insulin and hold metformin for now with half of home dose of long-acting insulin.  4. Essential hypertension.  Hold home medications for now and allow permissive hypertension up to 220/110.   DVT prophylaxis: Lovenox Code Status: Full Family Communication: Spouse at bedside Disposition Plan:Further CVA evaluation Consults called:Neurology Admission status: Obs, tele   Sunshyne Horvath Hoover BrunetteD Havyn Ramo DO [facility] Pager [phone]  If 7PM-7AM, please contact night-coverage [email or web address] Password [email or web address]  02/24/2018, 11:26 AM

## 2018-02-24 NOTE — ED Provider Notes (Signed)
[facility] EMERGENCY DEPARTMENT Provider Note   CSN: [id number] Arrival date & time: 02/24/18  4098     History   Chief Complaint Chief Complaint  Patient presents with  . Numbness    HPI [name] [name] is a 62 y.o. female.  HPI   62 year old female with facial numbness and general feeling that something is not right.  She states that she woke up in her usual state of health shortly before 6 AM.  Approximately 20 minutes later she began having numbness/burning sensation in the right side of her face near her right cheek just beneath her right eye.  She also had this vague sense that she just did not feel right.  She is having a hard time describing it beyond this.  She is also had intermittent diplopia.  Past Medical History:  Diagnosis Date  . Hypertension   . Microproteinuria   . Neuropathy   . Noncompliance   . Osteopenia   . Type 2 diabetes mellitus [facility])     Patient Active Problem List   Diagnosis Date Noted  . Acute osteomyelitis of right foot (HCC) 04/22/2016  . Wound infection 03/15/2016    Past Surgical History:  Procedure Laterality Date  . ABDOMINAL HYSTERECTOMY    . BACK SURGERY       OB History   None      Home Medications    Prior to Admission medications   Medication Sig Start Date End Date Taking? Authorizing Provider  azithromycin (ZITHROMAX Z-PAK) 250 MG tablet Take 2 tablets (500 mg) on  Day 1,  followed by 1 tablet (250 mg) once daily on Days 2 through 5. 12/16/17   Merlyn Albert, MD  Cyanocobalamin (B-12 PO) Take 1 tablet by mouth daily.    [provider]  Insulin Glargine (LANTUS SOLOSTAR) 100 UNIT/ML Solostar Pen Inject 46 Units into the skin daily at 10 pm.    [provider]  lisinopril (PRINIVIL,ZESTRIL) 10 MG tablet  11/25/17   [provider]  losartan (COZAAR) 100 MG tablet Take 100 mg by mouth daily.    [provider]  metFORMIN (GLUCOPHAGE) 500 MG tablet Take 500 mg by mouth 4 (four)  times daily.    [provider]    Family History Family History  Problem Relation Age of Onset  . Diabetes Mother     Social History Social History   Tobacco Use  . Smoking status: Never Smoker  . Smokeless tobacco: Never Used  Substance Use Topics  . Alcohol use: No  . Drug use: No     Allergies   Patient has no known allergies.   Review of Systems Review of Systems  All systems reviewed and negative, other than as noted in HPI.  Physical Exam Updated Vital Signs BP (!) 159/75   Pulse 71   Temp 97.9 F (36.6 C) (Oral)   Resp 20   Ht 5\' 2"  (1.575 m)   Wt 70.3 kg (155 lb)   SpO2 98%   BMI 28.35 kg/m   Physical Exam  Constitutional: She appears well-developed and well-nourished. No distress.  HENT:  Head: Normocephalic and atraumatic.  Eyes: Conjunctivae are normal. Right eye exhibits no discharge. Left eye exhibits no discharge.  Neck: Neck supple.  Cardiovascular: Normal rate, regular rhythm and normal heart sounds. Exam reveals no gallop and no friction rub.  No murmur heard. Pulmonary/Chest: Effort normal and breath sounds normal. No respiratory distress.  Abdominal: Soft. She exhibits no distension.  There is no tenderness.  Musculoskeletal: She exhibits no edema or tenderness.  Neurological: She is alert.  Skin: Skin is warm and dry.  Psychiatric: She has a normal mood and affect. Her behavior is normal. Thought content normal.  Nursing note and vitals reviewed.    ED Treatments / Results  Labs (all labs ordered are listed, but only abnormal results are displayed) Labs Reviewed  BASIC METABOLIC PANEL - Abnormal; Notable for the following components:      Result Value   Glucose, Bld 177 (*)    All other components within normal limits  CBG MONITORING, ED - Abnormal; Notable for the following components:   Glucose-Capillary 179 (*)    All other components within normal limits  CBC WITH DIFFERENTIAL/PLATELET  URINALYSIS, ROUTINE W  REFLEX MICROSCOPIC    EKG EKG Interpretation  Date/Time:  Monday February 24 2018 07:09:35 EDT Ventricular Rate:  69 PR Interval:    QRS Duration: 89 QT Interval:  387 QTC Calculation: 415 R Axis:   -20 Text Interpretation:  Sinus rhythm Borderline left axis deviation Low voltage, precordial leads Non-specific ST-t changes Confirmed by Raeford Razor [phone]) on 02/24/2018 8:18:07 AM   Radiology No results found.   Ct Head Wo Contrast  Result Date: 02/24/2018 CLINICAL DATA:  Left-sided facial numbness. EXAM: CT HEAD WITHOUT CONTRAST TECHNIQUE: Contiguous axial images were obtained from the base of the skull through the vertex without intravenous contrast. COMPARISON:  None. FINDINGS: Brain: No evidence of acute infarction, hemorrhage, hydrocephalus, extra-axial collection or mass lesion/mass effect. Vascular: No hyperdense vessel or unexpected calcification. Skull: Normal. Negative for fracture or focal lesion. Sinuses/Orbits: No acute finding. Other: None. IMPRESSION: Normal head CT. Electronically Signed   By: Irish Lack M.D.   On: 02/24/2018 08:32   Mr Maxine Glenn Head Wo Contrast  Result Date: 02/24/2018 CLINICAL DATA:  Blurred vision altered consciousness EXAM: MRI HEAD WITHOUT CONTRAST MRA HEAD WITHOUT CONTRAST TECHNIQUE: Multiplanar, multiecho pulse sequences of the brain and surrounding structures were obtained without intravenous contrast. Angiographic images of the head were obtained using MRA technique without contrast. COMPARISON:  CT head 02/24/2018 FINDINGS: MRI HEAD FINDINGS Brain: Small acute infarct in the posterior pons at the floor of the fourth ventricle measuring approximately 3 x 5 mm. No other acute infarct Chronic infarcts in the thalamus and pons bilaterally. Mild chronic microvascular ischemia in the white matter. Negative for hemorrhage mass or edema. No midline shift. Vascular: Normal arterial flow voids Skull and upper cervical spine: Negative Sinuses/Orbits: Mild mucosal  edema paranasal sinuses.  Normal orbit Other: None MRA HEAD FINDINGS Cavernous carotid widely patent bilaterally with mild atherosclerotic irregularity. Anterior and middle cerebral arteries patent bilaterally without stenosis or occlusion Both vertebral arteries patent to the basilar. Basilar widely patent. PICA and superior cerebellar arteries patent bilaterally. Mild stenosis right posterior cerebral artery and moderate stenosis left posterior cerebral artery Negative for cerebral aneurysm IMPRESSION: Small acute infarct posterior pons. Moderate chronic microvascular ischemic change No emergent large vessel occlusion. 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 Electronically Signed   By: Marlan Palau M.D.   On: 02/24/2018 10:26   Mr Brain Wo Contrast  Result Date: 02/24/2018 CLINICAL DATA:  Blurred vision altered consciousness EXAM: MRI HEAD WITHOUT CONTRAST MRA HEAD WITHOUT CONTRAST TECHNIQUE: Multiplanar, multiecho pulse sequences of the brain and surrounding structures were obtained without intravenous contrast. Angiographic images of the head were obtained using MRA technique without contrast. COMPARISON:  CT head 02/24/2018 FINDINGS: MRI HEAD FINDINGS Brain:  Small acute infarct in the posterior pons at the floor of the fourth ventricle measuring approximately 3 x 5 mm. No other acute infarct Chronic infarcts in the thalamus and pons bilaterally. Mild chronic microvascular ischemia in the white matter. Negative for hemorrhage mass or edema. No midline shift. Vascular: Normal arterial flow voids Skull and upper cervical spine: Negative Sinuses/Orbits: Mild mucosal edema paranasal sinuses.  Normal orbit Other: None MRA HEAD FINDINGS Cavernous carotid widely patent bilaterally with mild atherosclerotic irregularity. Anterior and middle cerebral arteries patent bilaterally without stenosis or occlusion Both vertebral arteries patent to the basilar. Basilar widely patent. PICA and  superior cerebellar arteries patent bilaterally. Mild stenosis right posterior cerebral artery and moderate stenosis left posterior cerebral artery Negative for cerebral aneurysm IMPRESSION: Small acute infarct posterior pons. Moderate chronic microvascular ischemic change No emergent large vessel occlusion. 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 Electronically Signed   By: Marlan Palau M.D.   On: 02/24/2018 10:26   US Carotid Bilateral (at [facility] Only)  Result Date: 02/24/2018 CLINICAL DATA:  Blurred vision.  Right-sided numbness.  Ataxia. EXAM: BILATERAL CAROTID DUPLEX ULTRASOUND TECHNIQUE: Wallace Cullens scale imaging, color Doppler and duplex ultrasound were performed of bilateral carotid and vertebral arteries in the neck. COMPARISON:  Brain MRI - earlier same day FINDINGS: Criteria: Quantification of carotid stenosis is based on velocity parameters that correlate the residual internal carotid diameter with NASCET-based stenosis levels, using the diameter of the distal internal carotid lumen as the denominator for stenosis measurement. The following velocity measurements were obtained: RIGHT ICA:  75/19 cm/sec CCA:  82/8 cm/sec SYSTOLIC ICA/CCA RATIO:  0.9 ECA:  99 cm/sec LEFT ICA:  83/22 cm/sec CCA:  101/14 cm/sec SYSTOLIC ICA/CCA RATIO:  0.8 ECA:  100 cm/sec RIGHT CAROTID ARTERY: There is a minimal amount of mixed echogenic plaque within the right carotid bulb (image 15). There is a minimal amount of eccentric echogenic plaque involving the proximal and mid aspects of the right internal carotid artery (image 24), not resulting in elevated peak systolic velocities within the interrogated course the right internal carotid artery to suggest a hemodynamically significant stenosis. RIGHT VERTEBRAL ARTERY:  Antegrade flow LEFT CAROTID ARTERY: There is a minimal amount of intimal thickening and atherosclerotic plaque involving the mid aspect of the left common carotid artery (image  61), not resulting in elevated peak systolic velocities within the interrogated course of the left internal carotid artery to suggest a hemodynamically significant stenosis. LEFT VERTEBRAL ARTERY:  Antegrade flow IMPRESSION: Minimal amount of bilateral atherosclerotic plaque, not resulting in a hemodynamically significant stenosis within either internal carotid artery Electronically Signed   By: Simonne Come M.D.   On: 02/24/2018 12:49    Procedures Procedures (including critical care time)  Medications Ordered in ED Medications  labetalol (NORMODYNE,TRANDATE) injection 10 mg (10 mg Intravenous Given 02/24/18 0752)     Initial Impression / Assessment and Plan / ED Course  I have reviewed the triage vital signs and the nursing notes.  Pertinent labs & imaging results that were available during my care of the patient were reviewed by me and considered in my medical decision making (see chart for details).     62 year old female with acute CVA.  Last known normal is not completely clear.  She told me that she feels like she may have woken up in her usual state of health and developed symptoms shortly later.  Apparently she told the triage nurse that she woke up with the symptoms.  When asked again she states that she is not sure.  Last known normal was sometime when she went to bed yesterday.  Not a TPA candidate given unclear onset of symptoms.  Admit for further work-up. Final Clinical Impressions(s) / ED Diagnoses   Final diagnoses:  CVA (cerebral vascular accident) [facility](HCC)    ED Discharge Orders    None       Raeford RazorKohut, Ronnesha Mester, MD 03/06/18 0127

## 2018-02-24 NOTE — Consult Note (Addendum)
[place] A. Merlene Laughter, MD     [email or web address]          [name] [name] is an 62 y.o. female.   ASSESSMENT/PLAN: 1.  Right dorsal medial pontine lacunar infarct presenting with diplopia, gait ataxia, R facial dysesthesia and mild left hemiparesis.  Risk factors hypertension, diabetes and age.  Agree with aspirin 325.  Follow-up lipid panel.  Patient would likely need statin even at a low dose.  Physical and occupational therapies are recommended. Permissive hypertension for the next day or 2.  Long-term goal blood pressure 130/85.  She can use an eyepatch in the next few days to help with the diplopia.  The patient is a 62 year old left-handed white female who presents with acute onset of right facial dysesthesia, facial discomfort, gait ataxia and difficulty using the left upper extremity.  She in fact reports having some tremor of the left upper extremity.  The family reports that her symptoms seem to be be getting worse since her initial development of the symptoms early yesterday.  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  She does report her blood sugar has not been well controlled as she would like.  She sees an endocrinologist in [place].  In addition to the symptoms above, she does have diplopia and the fact has the right eye closed because this apparently is binocular in nature.  All the symptoms are recent starting yesterday.  She does have neuropathy involving her feet which is a chronic issue associated with numbness.  She denies dysarthria, dysphagia or numbness of the extremities other than baseline neuropathic foot pain.  The review of systems otherwise negative. No TPA mild - low NISSS.  GENERAL: This is a pleasant moderately overweight female who is in discomfort from obvious diplopia but no acute distress.  HEENT: There is  significant right isotropia.   ABDOMEN: soft  EXTREMITIES: No edema   BACK: Normal  SKIN: Normal by inspection.    MENTAL STATUS: Alert and oriented -including the month and her age. Speech, language and cognition are generally intact. Judgment and insight normal.   CRANIAL NERVES: Pupils are equal, round and reactive to light and accomodation; extra ocular movements are full, there is no significant nystagmus; visual fields are full; upper and lower facial muscles are normal in strength and symmetric, there is questionable flattening of the R nasolabial fold; tongue is midline; uvula is midline; shoulder elevation is normal.  MOTOR: Normal tone, bulk and strength; there is a classic pronator drift of the left upper extremity associated with a mild downward drift.  There is also downward drift that is mild above the left lower extremity.  No drift is seen on the right side.  She has good hand dexterity on both sides.  COORDINATION: Left finger to nose is normal, right finger to nose is normal, No rest tremor; no intention tremor; no postural tremor; no bradykinesia.  REFLEXES: Deep tendon reflexes are symmetrical and normal. Plantar reflexes are flexor bilaterally.   SENSATION: Normal to light touch, temperature, and pain.  There is no extinction to double simultaneous stimulation.   NIH stroke scale 2.    Blood pressure (!) 152/94, pulse 69, temperature 98.3 F (36.8 C), temperature source Oral, resp. rate 20, height _0  (1.575 m), weight 153 lb 3.5 oz (69.5 kg), SpO2 97 %.  Past Medical History:  Diagnosis Date  . Hypertension   . Microproteinuria   . Neuropathy   . Noncompliance   . Osteopenia   . Type 2 diabetes mellitus ([place])     Past Surgical History:  Procedure Laterality Date  . ABDOMINAL HYSTERECTOMY    . BACK SURGERY      Family History  Problem Relation Age of Onset  . Diabetes Mother     Social History:  reports that she has never smoked. She has never  used smokeless tobacco. She reports that she does not drink alcohol or use drugs.  Allergies: No Known Allergies  Medications: Prior to Admission medications   Medication Sig Start Date End Date Taking? Authorizing Provider  Cyanocobalamin (B-12 PO) Take 2 tablets by mouth daily.    Yes [provider]  Insulin Glargine (LANTUS SOLOSTAR) 100 UNIT/ML Solostar Pen Inject 52 Units into the skin at bedtime.    Yes [provider]  metFORMIN (GLUCOPHAGE-XR) 500 MG 24 hr tablet Take 1 tablet by mouth 4 (four) times daily. 02/02/18  Yes [provider]  Multiple Vitamins-Minerals (ONE-A-DAY WOMENS VITACRAVES) CHEW Chew 1 tablet by mouth daily.   Yes [provider]  azithromycin (ZITHROMAX Z-PAK) 250 MG tablet Take 2 tablets (500 mg) on  Day 1,  followed by 1 tablet (250 mg) once daily on Days 2 through 5. Patient not taking: Reported on 02/24/2018 12/16/17   Mikey Kirschner, MD  lisinopril (PRINIVIL,ZESTRIL) 10 MG tablet  11/25/17   [provider]    Scheduled Meds: . [START ON 02/25/2018] aspirin  300 mg Rectal Daily   Or  . [START ON 02/25/2018] aspirin  325 mg Oral Daily  . atorvastatin  80 mg Oral q1800  . enoxaparin (LOVENOX) injection  40 mg Subcutaneous Q24H  . insulin aspart  0-5 Units Subcutaneous QHS  . insulin aspart  0-9 Units Subcutaneous TID WC  . insulin glargine  26 Units Subcutaneous QHS   Continuous Infusions: . sodium chloride 75 mL/hr at 02/24/18 1155  . [START ON 02/25/2018] cefTRIAXone (ROCEPHIN)  IV     PRN Meds:.acetaminophen **OR** acetaminophen (TYLENOL) oral liquid 160 mg/5 mL **OR** acetaminophen     Results for orders placed or performed during the hospital encounter of 02/24/18 (from the past 48 hour(s))  CBG monitoring, ED     Status: Abnormal   Collection Time: 02/24/18  7:07 AM  Result Value Ref Range   Glucose-Capillary 179 (H) 70 - 99 mg/dL  Urinalysis, Routine w reflex microscopic     Status: Abnormal    Collection Time: 02/24/18  7:38 AM  Result Value Ref Range   Color, Urine YELLOW YELLOW   APPearance HAZY (A) CLEAR   Specific Gravity, Urine 1.008 1.005 - 1.030   pH 6.0 5.0 - 8.0   Glucose, UA 50 (A) NEGATIVE mg/dL   Hgb urine dipstick SMALL (A) NEGATIVE   Bilirubin Urine NEGATIVE NEGATIVE   Ketones, ur NEGATIVE NEGATIVE mg/dL   Protein, ur NEGATIVE NEGATIVE mg/dL   Nitrite NEGATIVE NEGATIVE   Leukocytes, UA LARGE (A) NEGATIVE   RBC / HPF 6-10 0 - 5 RBC/hpf   WBC, UA >50 (H) 0 - 5 WBC/hpf   Bacteria, UA MANY (A) NONE SEEN   Squamous Epithelial / LPF 0-5 0 - 5   WBC Clumps PRESENT    Mucus PRESENT    Budding Yeast PRESENT    Hyaline Casts, UA PRESENT    Uric Acid Crys, UA PRESENT    Non Squamous  Epithelial 0-5 (A) NONE SEEN    Comment: Performed at [facility], [address] [place], [place] [address]  CBC with Differential     Status: None   Collection Time: 02/24/18  7:57 AM  Result Value Ref Range   WBC 7.3 4.0 - 10.5 K/uL   RBC 5.00 3.87 - 5.11 MIL/uL   Hemoglobin 13.6 12.0 - 15.0 g/dL   HCT 41.7 36.0 - 46.0 %   MCV 83.4 78.0 - 100.0 fL   MCH 27.2 26.0 - 34.0 pg   MCHC 32.6 30.0 - 36.0 g/dL   RDW 15.0 11.5 - 15.5 %   Platelets 254 150 - 400 K/uL   Neutrophils Relative % 50 %   Neutro Abs 3.7 1.7 - 7.7 K/uL   Lymphocytes Relative 32 %   Lymphs Abs 2.4 0.7 - 4.0 K/uL   Monocytes Relative 11 %   Monocytes Absolute 0.8 0.1 - 1.0 K/uL   Eosinophils Relative 6 %   Eosinophils Absolute 0.4 0.0 - 0.7 K/uL   Basophils Relative 1 %   Basophils Absolute 0.1 0.0 - 0.1 K/uL    Comment: Performed at [facility], [address], [place], [place] [address]  Basic metabolic panel     Status: Abnormal   Collection Time: 02/24/18  7:57 AM  Result Value Ref Range   Sodium 141 135 - 145 mmol/L   Potassium 3.7 3.5 - 5.1 mmol/L   Chloride 105 98 - 111 mmol/L    Comment: Please note change in reference range.   CO2 25 22 - 32 mmol/L   Glucose, Bld 177 (H) 70 - 99 mg/dL     Comment: Please note change in reference range.   BUN 11 8 - 23 mg/dL    Comment: Please note change in reference range.   Creatinine, Ser 0.62 0.44 - 1.00 mg/dL   Calcium 9.4 8.9 - 10.3 mg/dL   GFR calc non Af Amer >60 >60 mL/min   GFR calc Af Amer >60 >60 mL/min    Comment: (NOTE) The eGFR has been calculated using the CKD EPI equation. This calculation has not been validated in all clinical situations. eGFR's persistently <60 mL/min signify possible Chronic Kidney Disease.    Anion gap 11 5 - 15    Comment: Performed at [facility], [address], [place], [place] [address]  TSH     Status: None   Collection Time: 02/24/18  7:59 AM  Result Value Ref Range   TSH 1.960 0.350 - 4.500 uIU/mL    Comment: Performed by a 3rd Generation assay with a functional sensitivity of <=0.01 uIU/mL. Performed at [facility], [address], [place],  [address]   Glucose, capillary     Status: Abnormal   Collection Time: 02/24/18  1:08 PM  Result Value Ref Range   Glucose-Capillary 160 (H) 70 - 99 mg/dL   Comment 1 Notify RN    Comment 2 Document in Chart   Glucose, capillary     Status: Abnormal   Collection Time: 02/24/18  4:33 PM  Result Value Ref Range   Glucose-Capillary 117 (H) 70 - 99 mg/dL   Comment 1 Notify RN    Comment 2 Document in Chart     Studies/Results:   ECHO - Left ventricle: The cavity size was normal. Wall thickness was   increased in a pattern of mild LVH. Systolic function was normal.   The estimated ejection fraction was in the range of 60% to 65%.  Wall motion was normal; there were no regional wall motion   abnormalities. Features are consistent with a pseudonormal left   ventricular filling pattern, with concomitant abnormal relaxation   and increased filling pressure (grade 2 diastolic dysfunction). - Aortic valve: Mildly calcified annulus. Trileaflet; mildly   calcified leaflets. Moderate calcification involving the left   coronary cusp. There was  mild stenosis. Mean gradient (S): 8 mm   Hg. Peak gradient (S): 15 mm Hg. VTI ratio of LVOT to aortic   valve: 0.69. Valve area (VTI): 1.57 cm^2. Valve area (Vmax): 1.62   cm^2. Valve area (Vmean): 1.67 cm^2. - Mitral valve: Mildly calcified annulus. - Right atrium: Central venous pressure (est): 3 mm Hg. - Atrial septum: No defect or patent foramen ovale was identified. - Tricuspid valve: There was trivial regurgitation. - Pulmonary arteries: Systolic pressure could not be accurately   estimated. - Pericardium, extracardiac: There was no pericardial effusion.     BRAIN MRI MRA FINDINGS: MRI HEAD FINDINGS  Brain: Small acute infarct in the posterior pons at the floor of the fourth ventricle measuring approximately 3 x 5 mm. No other acute infarct  Chronic infarcts in the thalamus and pons bilaterally. Mild chronic microvascular ischemia in the white matter. Negative for hemorrhage mass or edema. No midline shift.  Vascular: Normal arterial flow voids  Skull and upper cervical spine: Negative  Sinuses/Orbits: Mild mucosal edema paranasal sinuses.  Normal orbit  Other: None  MRA HEAD FINDINGS  Cavernous carotid widely patent bilaterally with mild atherosclerotic irregularity. Anterior and middle cerebral arteries patent bilaterally without stenosis or occlusion  Both vertebral arteries patent to the basilar. Basilar widely patent. PICA and superior cerebellar arteries patent bilaterally. Mild stenosis right posterior cerebral artery and moderate stenosis left posterior cerebral artery  Negative for cerebral aneurysm  IMPRESSION: Small acute infarct posterior pons.  Moderate chronic microvascular ischemic change  No emergent large vessel occlusion. 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   CAROTID DOPPLERS NORMAL   The brain MRI is reviewed in person.  There is a increased signal seen on DWI involving the medial  dorsal aspect of the pontine area on the right.  This is associated with reduced signal on the ADC scan.  There is mild periventricular white matter disease and a few deep white matter increased signal.  No hemorrhages appreciated.  No intracranial occlusive disease seen on MRA.     Morris [place] A. Merlene Laughter, M.D.  Diplomate, Tax adviser of Psychiatry and Neurology ( Neurology). 02/24/2018, 4:53 PM

## 2018-02-24 NOTE — ED Triage Notes (Addendum)
Pt woke up at 0550 am this morning with left sided facial numbness and states "I dont feel right and I feel like im in a fog" pt states having her blood pressure medication changed in march.  Pt has not taken her bp meds since

## 2018-02-24 NOTE — ED Notes (Signed)
ED Provider at bedside. 

## 2018-02-25 DIAGNOSIS — I639 Cerebral infarction, unspecified: Secondary | ICD-10-CM | POA: Diagnosis not present

## 2018-02-25 LAB — GLUCOSE, CAPILLARY
GLUCOSE-CAPILLARY: 151 mg/dL — AB (ref 70–99)
GLUCOSE-CAPILLARY: 312 mg/dL — AB (ref 70–99)

## 2018-02-25 LAB — LIPID PANEL
Cholesterol: 161 mg/dL (ref 0–200)
HDL: 43 mg/dL (ref >40)
LDL Cholesterol: 78 mg/dL (ref 0–99)
Total CHOL/HDL Ratio: 3.7 RATIO
Triglycerides: 198 mg/dL — ABNORMAL HIGH (ref <150)
VLDL: 40 mg/dL (ref 0–40)

## 2018-02-25 LAB — HIV ANTIBODY (ROUTINE TESTING W REFLEX): HIV SCREEN 4TH GENERATION: NONREACTIVE

## 2018-02-25 LAB — HEMOGLOBIN A1C
HEMOGLOBIN A1C: 10.1 % — AB (ref 4.8–5.6)
MEAN PLASMA GLUCOSE: 243.17 mg/dL

## 2018-02-25 MED ORDER — CIPROFLOXACIN HCL 500 MG PO TABS: 500.0000 mg | ORAL_TABLET | Freq: Two times a day (BID) | ORAL | 0 refills | Status: DC

## 2018-02-25 MED ORDER — ASPIRIN 325 MG PO TABS: 325.0000 mg | ORAL_TABLET | Freq: Every day | ORAL | 0 refills | Status: AC

## 2018-02-25 MED ORDER — ATORVASTATIN CALCIUM 10 MG PO TABS: 10.0000 mg | ORAL_TABLET | Freq: Every day | ORAL | 3 refills | Status: DC

## 2018-02-25 NOTE — Evaluation (Signed)
Occupational Therapy Evaluation Patient Details Name: [name] StarrJanice P Link MRN: [id number] DOB: 05/10/1956 Today's Date: 02/25/2018    History of Present Illness [name] [name] is a 62 y.o. female with medical history significant for type 2 diabetes, and hypertension who reports some poor compliance with home blood pressure medications.  She presented to the ED with complaints of right-sided facial numbness and tingling that woke her up this morning at approximately 5:50 AM.  She states that much of this sensation was underneath her right eye and she did not quite feel well.  On her way to the ED she had trouble with her balance and thus, trouble ambulating.  She denies any specific focal weakness to her arms or legs and denies any headache.  She also has some diplopia when she leaves both of her eyes open and is alleviated when she only uses one eye to see.   Clinical Impression   Pt received supine in bed, husband present, agreeable to OT evaluation this am. Pt demonstrates BUE strength WNL, sensation and coordination intact. Pt presents with diplopia, disappearing with one eye closed. Vision seems to be most limiting factor regarding ADL completion and functional mobility. Pt balance improves with use of RW, reporting she feels as if she is losing her balance, however no LOB noted during functional mobility to sink & back to bed. Pt and husband report she will have 24/7 supervision/assistance on discharge home. If visual deficits do not improve pt may benefit from OT low vision evaluation in 4-6 weeks. No further OT needed at this time as pt will have necessary assistance and is functioning at supervision level for ADLs.     Follow Up Recommendations  Outpatient OT;Supervision/Assistance - 24 hour (low vision eval if deficits persist)    Equipment Recommendations  Tub/shower seat       Precautions / Restrictions Precautions Precautions: Fall Precaution Comments: 1 recent fall and  diplopia Restrictions Weight Bearing Restrictions: No      Mobility Bed Mobility Overal bed mobility: Modified Independent                Transfers Overall transfer level: Needs assistance Equipment used: None Transfers: Sit to/from Stand Sit to Stand: Min guard         General transfer comment: Pt used bed and bed rail to push up from to come to standing        ADL either performed or assessed with clinical judgement   ADL Overall ADL's : Needs assistance/impaired     Grooming: Wash/dry hands;Min guard;Standing Grooming Details (indicate cue type and reason): Pt requiring assist in standing due to balance deficits             Lower Body Dressing: Supervision/safety;Sitting/lateral leans Lower Body Dressing Details (indicate cue type and reason): pt able to donn socks while seated at EOB             Functional mobility during ADLs: Min guard;Rolling walker General ADL Comments: Pt requiring increased assistance for ADLs due to visual deficits affecting balance     Vision Baseline Vision/History: No visual deficits Patient Visual Report: Diplopia;Blurring of vision(right eye blurring ) Vision Assessment?: Yes Eye Alignment: Within Functional Limits Ocular Range of Motion: Within Functional Limits Alignment/Gaze Preference: Within Defined Limits Tracking/Visual Pursuits: Able to track stimulus in all quads without difficulty Saccades: Within functional limits Convergence: Within functional limits Visual Fields: No apparent deficits Diplopia Assessment: Disappears with one eye closed  Pertinent Vitals/Pain Pain Assessment: No/denies pain     Hand Dominance Left   Extremity/Trunk Assessment Upper Extremity Assessment Upper Extremity Assessment: Overall WFL for tasks assessed(strength grossly 4+/5)   Lower Extremity Assessment Lower Extremity Assessment: Defer to PT evaluation   Cervical / Trunk Assessment Cervical / Trunk  Assessment: Normal   Communication Communication Communication: No difficulties   Cognition Arousal/Alertness: Awake/alert Behavior During Therapy: WFL for tasks assessed/performed Overall Cognitive Status: Within Functional Limits for tasks assessed                                                Home Living Family/patient expects to be discharged to:: Private residence Living Arrangements: Spouse/significant other Available Help at Discharge: Family;Friend(s);Available 24 hours/day Type of Home: House Home Access: Stairs to enter [employer] of Steps: 2 Entrance Stairs-Rails: None Home Layout: One level     Bathroom Shower/Tub: Chief Strategy Officer: Standard     Home Equipment: None          Prior Functioning/Environment Level of Independence: Independent        Comments: pt working and driving PTA        OT Problem List: Impaired balance (sitting and/or standing);Impaired vision/perception       AM-PAC PT "6 Clicks" Daily Activity     Outcome Measure Help from another person eating meals?: None Help from another person taking care of personal grooming?: A Little Help from another person toileting, which includes using toliet, bedpan, or urinal?: A Little Help from another person bathing (including washing, rinsing, drying)?: A Little Help from another person to put on and taking off regular upper body clothing?: None Help from another person to put on and taking off regular lower body clothing?: A Little 6 Click Score: 20   End of Session Equipment Utilized During Treatment: Gait belt;Rolling walker  Activity Tolerance: Patient tolerated treatment well Patient left: in bed;with call bell/phone within reach;with family/visitor present  OT Visit Diagnosis: Muscle weakness (generalized) (M62.81)                Time: 1610-9604 OT Time Calculation (min): 29 min Charges:  OT General Charges $OT Visit: 1 Visit OT  Evaluation $OT Eval Low Complexity: 1 Low    Ezra Sites, OTR/L  [id number] 02/25/2018, 8:11 AM

## 2018-02-25 NOTE — Plan of Care (Signed)
  Problem: Acute Rehab PT Goals(only PT should resolve) Goal: Patient Will Transfer Sit To/From Stand Outcome: Progressing Flowsheets (Taken 02/25/2018 0851) Patient will transfer sit to/from stand: with supervision Goal: Pt Will Transfer Bed To Chair/Chair To Bed Outcome: Progressing Flowsheets (Taken 02/25/2018 0851) Pt will Transfer Bed to Chair/Chair to Bed: with supervision Goal: Pt Will Ambulate Outcome: Progressing Flowsheets (Taken 02/25/2018 0851) Pt will Ambulate: 100 feet;with min guard assist;with rolling walker   8:52 AM, 02/25/18 Ocie BobJames Deen Deguia, MPT Physical Therapist [facility] 336 [phone] office [phone] mobile phone

## 2018-02-25 NOTE — Evaluation (Signed)
Physical Therapy Evaluation Patient Details Name: Juleen StarrJanice P Barrie MRN: [id number] DOB: 02/20/1956 Today's Date: 02/25/2018   History of Present Illness  Juleen StarrJanice P Kohlbeck is a 62 y.o. female with medical history significant for type 2 diabetes, and hypertension who reports some poor compliance with home blood pressure medications.  She presented to the ED with complaints of right-sided facial numbness and tingling that woke her up this morning at approximately 5:50 AM.  She states that much of this sensation was underneath her right eye and she did not quite feel well.  On her way to the ED she had trouble with her balance and thus, trouble ambulating.  She denies any specific focal weakness to her arms or legs and denies any headache.  She also has some diplopia when she leaves both of her eyes open and is alleviated when she only uses one eye to see.    Clinical Impression  Patient mostly limited for gait due to double vision, has to keep right eye closed to maintain standing balance and when taking steps, veers to the left and had to use RW for gait training (see below).  Patient instructed in focusing exercises and able to reduce double vision when object closer to face.  Patient tolerated sitting up at bedside to eat breakfast after therapy.  Patient will benefit from continued physical therapy in hospital and recommended venue below to increase strength, balance, endurance for safe ADLs and gait.    Follow Up Recommendations Home health PT;Supervision/Assistance - 24 hour    Equipment Recommendations  None recommended by PT    Recommendations for Other Services       Precautions / Restrictions Precautions Precautions: Fall Precaution Comments: 1 recent fall and diplopia Restrictions Weight Bearing Restrictions: No      Mobility  Bed Mobility Overal bed mobility: Modified Independent                Transfers Overall transfer level: Needs assistance Equipment used:  None;Straight cane;Rolling walker (2 wheeled) Transfers: Sit to/from [employer]Stand;Stand Pivot Transfers Sit to Stand: Min guard Stand pivot transfers: Min assist       General transfer comment: leans to the left requiring assist to stay upright without use of an AD, slight improvement using cane, safer using RW  Ambulation/Gait Ambulation/Gait assistance: Min assist Gait Distance (Feet): 75 Feet Assistive device: Rolling walker (2 wheeled);Straight cane Gait Pattern/deviations: Decreased step length - right;Decreased step length - left;Decreased stride length Gait velocity: slow   General Gait Details: very unsteady and limited to bedside with both eyes open, improvement with right eye closed, very unsteady with veering to the left using SPC, near fall prevented by therapist, required use of RW for safety and demonstrates slow labored cadence with frequent stopping to regain balance  Stairs            Wheelchair Mobility    Modified Rankin (Stroke Patients Only)       Balance Overall balance assessment: Needs assistance Sitting-balance support: Feet supported;No upper extremity supported Sitting balance-Leahy Scale: Good     Standing balance support: No upper extremity supported;During functional activity Standing balance-Leahy Scale: Poor Standing balance comment: fair/poor with SPC, fair using RW                             Pertinent Vitals/Pain Pain Assessment: No/denies pain    Home Living Family/patient expects to be discharged to:: Private residence Living Arrangements: Spouse/significant other Available  Help at Discharge: Family;Friend(s);Available 24 hours/day Type of Home: House Home Access: Stairs to enter Entrance Stairs-Rails: None Entrance Stairs-Number of Steps: 2 Home Layout: One level Home Equipment: Environmental consultant - 2 wheels      Prior Function Level of Independence: Independent         Comments: community ambulator, drives, works      Higher education careers adviser   Dominant Hand: Left    Extremity/Trunk Assessment   Upper Extremity Assessment Upper Extremity Assessment: Defer to OT evaluation    Lower Extremity Assessment Lower Extremity Assessment: Overall WFL for tasks assessed    Cervical / Trunk Assessment Cervical / Trunk Assessment: Normal  Communication   Communication: No difficulties  Cognition Arousal/Alertness: Awake/alert Behavior During Therapy: WFL for tasks assessed/performed Overall Cognitive Status: Within Functional Limits for tasks assessed                                        General Comments      Exercises     Assessment/Plan    PT Assessment Patient needs continued PT services  PT Problem List Decreased activity tolerance;Decreased balance;Decreased mobility;Other (comment)(limited due to double vision)       PT Treatment Interventions Gait training;Stair training;Therapeutic activities;Therapeutic exercise;Patient/family education;Balance training    PT Goals (Current goals can be found in the Care Plan section)  Acute Rehab PT Goals Patient Stated Goal: return home with spouse to help PT Goal Formulation: With patient/family Time For Goal Achievement: 03/01/18 Potential to Achieve Goals: Good    Frequency 7X/week   Barriers to discharge        Co-evaluation               AM-PAC PT "6 Clicks" Daily Activity  Outcome Measure Difficulty turning over in bed (including adjusting bedclothes, sheets and blankets)?: None Difficulty moving from lying on back to sitting on the side of the bed? : None Difficulty sitting down on and standing up from a chair with arms (e.g., wheelchair, bedside commode, etc,.)?: A Little Help needed moving to and from a bed to chair (including a wheelchair)?: A Little Help needed walking in hospital room?: A Lot Help needed climbing 3-5 steps with a railing? : A Lot 6 Click Score: 18    End of Session   Activity  Tolerance: Patient tolerated treatment well;Patient limited by fatigue Patient left: in bed;with call bell/phone within reach;with family/visitor present(seated at bedside to eat breakfast) Nurse Communication: Mobility status PT Visit Diagnosis: Unsteadiness on feet (R26.81);Other abnormalities of gait and mobility (R26.89);Muscle weakness (generalized) (M62.81)    Time: 0802-0829 PT Time Calculation (min) (ACUTE ONLY): 27 min   Charges:   PT Evaluation $PT Eval Moderate Complexity: 1 Mod PT Treatments $Therapeutic Activity: 23-37 mins   PT G Codes:        8:50 AM, 2018/03/07 Ocie Bob, MPT Physical Therapist with [facility] [phone] office [phone] mobile phone

## 2018-02-25 NOTE — Care Management Note (Addendum)
Case Management Note  Patient Details  Name: [name] StarrJanice P [name] MRN: [id number] Date of Birth: 10/25/1955  Subjective/Objective:       Admitted with CVA. Pt from home, ind pta. Has PCP, transportation, lives with husband. She works full time pta. She will have someone with her 24/7 at DC.              Action/Plan: DC home today with HH services. Pt has chosen [facility] from list of [facility]. She is aware HH has 48 hrs to make first visit. Olegario MessierKathy[facility] given [facility].   Expected Discharge Date:  02/25/18               Expected Discharge Plan:  Home w Home Health Services  In-House Referral:  NA  Discharge planning Services  CM Consult  Post Acute Care Choice:  Home Health Choice offered to:  Patient  HH Arranged:  PT, OT HH Agency:  [facility]  Status of Service:  Completed, signed off   Addendum: Pt's plan was to use mothers RW, pt now would like her own. Order requested[facility]W to pt's room prior to DC.    Malcolm Metrohildress, Azlan Hanway Demske, RN 02/25/2018, 11:34 AM

## 2018-02-25 NOTE — Discharge Summary (Signed)
Physician Discharge Summary  [name] StarrJanice P Moore ZOX:[id number] DOB: 01/13/1956 DOA: 02/24/2018  PCP: Merlyn AlbertLuking, William S, MD  Admit date: 02/24/2018  Discharge date: 02/25/2018  Admitted From:Home  Disposition:  Home  Recommendations for Outpatient Follow-up:  1. Follow up with PCP in 1-2 weeks 2. Follow-up with neurology Dr. Gerilyn Pilgrimoonquah in 4 weeks  Home Health: Arranged with home PT/OT  Equipment/Devices: Walker  Discharge Condition: Stable  CODE STATUS: Full  Diet recommendation: Heart Healthy/carb modified  Brief/Interim Summary:  This is a 62 year old female with history of type 2 diabetes and hypertension who presented to the ED with complaints of right-sided facial numbness and tingling as well as some gait ataxia and mild left-sided hemiparesis.  She was noted to have a right dorsal medial pontine lacunar infarct on MRI and has been started on aspirin 325 mg as well as statin medication.  She was not given TPA in the emergency department as she appears to have woken up with much of her symptoms and initial time of onset was unclear.  She continues to have persistent diplopia as well as gait instability for which she has been seen by PT/OT with recommendations for eyepatch to wear at home as well as home health physical therapy with a walker to assist in her balance.  She has also been seen by neurology with recommendations to continue on full dose aspirin as well as low-dose statin which has been prescribed.  She was additionally noted to have UTI with gram-negative rods noted in urine culture for which she has received 2 days of Rocephin and will be discharged with 2 more days of ciprofloxacin to complete course of treatment.  Discharge Diagnoses:  Principal Problem:   CVA (cerebral vascular accident) [facility](HCC) Active Problems:   Essential hypertension   Diabetes (HCC)   Acute lower UTI  1. 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   Continue with full dose aspirin as well as statin as prescribed.  Patient will continue blood pressure medications at home and remain more compliant.  Follow-up with neurology in 4 weeks as well as PCP in 2 weeks.  Patient will wear eye patch and will have her set up with home health PT/OT. 2. UTI with gram-negative rods.  Patient has remained asymptomatic from this standpoint and has received 2 days of IV Rocephin and will be discharged on ciprofloxacin for 2 more days. 3. Type 2 diabetes with mild hyperglycemia continue home medications as prior. 4. Essential hypertension.  Continue home medications as prior.  Discharge Instructions  Discharge Instructions    Diet - low sodium heart healthy   Complete by:  As directed    Increase activity slowly   Complete by:  As directed      Allergies as of 02/25/2018   No Known Allergies     Medication List    STOP taking these medications   azithromycin 250 MG tablet Commonly known as:  ZITHROMAX Z-PAK     TAKE these medications   aspirin 325 MG tablet Take 1 tablet (325 mg total) by mouth daily. Start taking on:  02/26/2018   atorvastatin 10 MG tablet Commonly known as:  LIPITOR Take 1 tablet (10 mg total) by mouth daily at 6 PM.   B-12 PO Take 2 tablets by mouth daily.   ciprofloxacin 500 MG tablet Commonly known as:  CIPRO Take 1 tablet (500 mg total) by mouth 2 (two) times daily for 2 days.   LANTUS SOLOSTAR 100  UNIT/ML Solostar Pen Generic drug:  Insulin Glargine Inject 52 Units into the skin at bedtime.   lisinopril 10 MG tablet Commonly known as:  PRINIVIL,ZESTRIL   metFORMIN 500 MG 24 hr tablet Commonly known as:  GLUCOPHAGE-XR Take 1 tablet by mouth 4 (four) times daily.   ONE-A-DAY WOMENS VITACRAVES Chew Chew 1 tablet by mouth daily.      Follow-up Information    Merlyn Albert, MD Follow up in 2 week(s).   Specialty:  Family Medicine Contact information: [address] [place]  [address] [phone]        Beryle Beams, MD Follow up in 4 week(s).   Specialty:  Neurology Contact information: [address] [place] [address] [phone]          No Known Allergies  Consultations:  Neurology Dr. Gerilyn Pilgrim   Procedures/Studies: Ct Head Wo Contrast  Result Date: 02/24/2018 CLINICAL DATA:  Left-sided facial numbness. EXAM: CT HEAD WITHOUT CONTRAST TECHNIQUE: Contiguous axial images were obtained from the base of the skull through the vertex without intravenous contrast. COMPARISON:  None. FINDINGS: Brain: No evidence of acute infarction, hemorrhage, hydrocephalus, extra-axial collection or mass lesion/mass effect. Vascular: No hyperdense vessel or unexpected calcification. Skull: Normal. Negative for fracture or focal lesion. Sinuses/Orbits: No acute finding. Other: None. IMPRESSION: Normal head CT. Electronically Signed   By: Irish Lack M.D.   On: 02/24/2018 08:32   Mr Maxine Glenn Head Wo Contrast  Result Date: 02/24/2018 CLINICAL DATA:  Blurred vision altered consciousness EXAM: MRI HEAD WITHOUT CONTRAST MRA HEAD WITHOUT CONTRAST TECHNIQUE: Multiplanar, multiecho pulse sequences of the brain and surrounding structures were obtained without intravenous contrast. Angiographic images of the head were obtained using MRA technique without contrast. COMPARISON:  CT head 02/24/2018 FINDINGS: MRI HEAD FINDINGS Brain: Small acute infarct in the posterior pons at the floor of the fourth ventricle measuring approximately 3 x 5 mm. No other acute infarct Chronic infarcts in the thalamus and pons bilaterally. Mild chronic microvascular ischemia in the white matter. Negative for hemorrhage mass or edema. No midline shift. Vascular: Normal arterial flow voids Skull and upper cervical spine: Negative Sinuses/Orbits: Mild mucosal edema paranasal sinuses.  Normal orbit Other: None MRA HEAD FINDINGS Cavernous carotid widely patent bilaterally with mild atherosclerotic  irregularity. Anterior and middle cerebral arteries patent bilaterally without stenosis or occlusion Both vertebral arteries patent to the basilar. Basilar widely patent. PICA and superior cerebellar arteries patent bilaterally. Mild stenosis right posterior cerebral artery and moderate stenosis left posterior cerebral artery Negative for cerebral aneurysm IMPRESSION: Small acute infarct posterior pons. Moderate chronic microvascular ischemic change No emergent large vessel occlusion. 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 Electronically Signed   By: Marlan Palau M.D.   On: 02/24/2018 10:26   Mr Brain Wo Contrast  Result Date: 02/24/2018 CLINICAL DATA:  Blurred vision altered consciousness EXAM: MRI HEAD WITHOUT CONTRAST MRA HEAD WITHOUT CONTRAST TECHNIQUE: Multiplanar, multiecho pulse sequences of the brain and surrounding structures were obtained without intravenous contrast. Angiographic images of the head were obtained using MRA technique without contrast. COMPARISON:  CT head 02/24/2018 FINDINGS: MRI HEAD FINDINGS Brain: Small acute infarct in the posterior pons at the floor of the fourth ventricle measuring approximately 3 x 5 mm. No other acute infarct Chronic infarcts in the thalamus and pons bilaterally. Mild chronic microvascular ischemia in the white matter. Negative for hemorrhage mass or edema. No midline shift. Vascular: Normal arterial flow voids Skull and upper cervical spine: Negative  Sinuses/Orbits: Mild mucosal edema paranasal sinuses.  Normal orbit Other: None MRA HEAD FINDINGS Cavernous carotid widely patent bilaterally with mild atherosclerotic irregularity. Anterior and middle cerebral arteries patent bilaterally without stenosis or occlusion Both vertebral arteries patent to the basilar. Basilar widely patent. PICA and superior cerebellar arteries patent bilaterally. Mild stenosis right posterior cerebral artery and moderate stenosis left posterior  cerebral artery Negative for cerebral aneurysm IMPRESSION: Small acute infarct posterior pons. Moderate chronic microvascular ischemic change No emergent large vessel occlusion. 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 Electronically Signed   By: Marlan Palau M.D.   On: 02/24/2018 10:26   US Carotid Bilateral (at [facility] And [facility] Only)  Result Date: 02/24/2018 CLINICAL DATA:  Blurred vision.  Right-sided numbness.  Ataxia. EXAM: BILATERAL CAROTID DUPLEX ULTRASOUND TECHNIQUE: Wallace Cullens scale imaging, color Doppler and duplex ultrasound were performed of bilateral carotid and vertebral arteries in the neck. COMPARISON:  Brain MRI - earlier same day FINDINGS: Criteria: Quantification of carotid stenosis is based on velocity parameters that correlate the residual internal carotid diameter with NASCET-based stenosis levels, using the diameter of the distal internal carotid lumen as the denominator for stenosis measurement. The following velocity measurements were obtained: RIGHT ICA:  75/19 cm/sec CCA:  82/8 cm/sec SYSTOLIC ICA/CCA RATIO:  0.9 ECA:  99 cm/sec LEFT ICA:  83/22 cm/sec CCA:  101/14 cm/sec SYSTOLIC ICA/CCA RATIO:  0.8 ECA:  100 cm/sec RIGHT CAROTID ARTERY: There is a minimal amount of mixed echogenic plaque within the right carotid bulb (image 15). There is a minimal amount of eccentric echogenic plaque involving the proximal and mid aspects of the right internal carotid artery (image 24), not resulting in elevated peak systolic velocities within the interrogated course the right internal carotid artery to suggest a hemodynamically significant stenosis. RIGHT VERTEBRAL ARTERY:  Antegrade flow LEFT CAROTID ARTERY: There is a minimal amount of intimal thickening and atherosclerotic plaque involving the mid aspect of the left common carotid artery (image 61), not resulting in elevated peak systolic velocities within the interrogated course of the left internal carotid artery to suggest  a hemodynamically significant stenosis. LEFT VERTEBRAL ARTERY:  Antegrade flow IMPRESSION: Minimal amount of bilateral atherosclerotic plaque, not resulting in a hemodynamically significant stenosis within either internal carotid artery Electronically Signed   By: Simonne Come M.D.   On: 02/24/2018 12:49    Discharge Exam: Vitals:   02/25/18 0435 02/25/18 0900  BP: (!) 163/73 (!) 170/75  Pulse: 63 74  Resp: 17 18  Temp: 98.6 F (37 C) 98.1 F (36.7 C)  SpO2: 95% 97%   Vitals:   02/24/18 1504 02/25/18 0100 02/25/18 0435 02/25/18 0900  BP:  (!) 189/80 (!) 163/73 (!) 170/75  Pulse:  63 63 74  Resp:  20 17 18   Temp:  98.3 F (36.8 C) 98.6 F (37 C) 98.1 F (36.7 C)  TempSrc:  Oral Oral Oral  SpO2:  93% 95% 97%  Weight:      Height: 5\' 2"  (1.575 m)       General: Pt is alert, awake, not in acute distress Cardiovascular: RRR, S1/S2 +, no rubs, no gallops Respiratory: CTA bilaterally, no wheezing, no rhonchi Abdominal: Soft, NT, ND, bowel sounds + Extremities: no edema, no cyanosis    The results of significant diagnostics from this hospitalization (including imaging, microbiology, ancillary and laboratory) are listed below for reference.     Microbiology: Recent Results (from the past 240 hour(s))  Urine culture     Status:  Abnormal (Preliminary result)   Collection Time: 02/24/18  7:38 AM  Result Value Ref Range Status   Specimen Description   Final    URINE, CLEAN CATCH Performed at [facility], [address], [place], [place] [address]    Special Requests   Final    NONE Performed at [facility], [address] [place], [place] [address]    Culture >=100,000 COLONIES/mL GRAM NEGATIVE RODS (A)  Final   Report Status PENDING  Incomplete     Labs: BNP (last 3 results) No results for input(s): BNP in the last 8760 hours. Basic Metabolic Panel: Recent Labs  Lab 02/24/18 0757  NA 141  K 3.7  CL 105  CO2 25  GLUCOSE 177*  BUN 11  CREATININE 0.62  CALCIUM  9.4   Liver Function Tests: No results for input(s): AST, ALT, ALKPHOS, BILITOT, PROT, ALBUMIN in the last 168 hours. No results for input(s): LIPASE, AMYLASE in the last 168 hours. No results for input(s): AMMONIA in the last 168 hours. CBC: Recent Labs  Lab 02/24/18 0757  WBC 7.3  NEUTROABS 3.7  HGB 13.6  HCT 41.7  MCV 83.4  PLT 254   Cardiac Enzymes: No results for input(s): CKTOTAL, CKMB, CKMBINDEX, TROPONINI in the last 168 hours. BNP: Invalid input(s): POCBNP CBG: Recent Labs  Lab 02/24/18 1308 02/24/18 1633 02/24/18 2150 02/25/18 0735 02/25/18 1114  GLUCAP 160* 117* 219* 151* 312*   D-Dimer No results for input(s): DDIMER in the last 72 hours. Hgb A1c No results for input(s): HGBA1C in the last 72 hours. Lipid Profile Recent Labs    02/25/18 0408  CHOL 161  HDL 43  LDLCALC 78  TRIG 198*  CHOLHDL 3.7   Thyroid function studies Recent Labs    02/24/18 0759  TSH 1.960   Anemia work up No results for input(s): VITAMINB12, FOLATE, FERRITIN, TIBC, IRON, RETICCTPCT in the last 72 hours. Urinalysis    Component Value Date/Time   COLORURINE YELLOW 02/24/2018 0738   APPEARANCEUR HAZY (A) 02/24/2018 0738   LABSPEC 1.008 02/24/2018 0738   PHURINE 6.0 02/24/2018 0738   GLUCOSEU 50 (A) 02/24/2018 0738   HGBUR SMALL (A) 02/24/2018 0738   BILIRUBINUR NEGATIVE 02/24/2018 0738   KETONESUR NEGATIVE 02/24/2018 0738   PROTEINUR NEGATIVE 02/24/2018 0738   NITRITE NEGATIVE 02/24/2018 0738   LEUKOCYTESUR LARGE (A) 02/24/2018 0738   Sepsis Labs Invalid input(s): PROCALCITONIN,  WBC,  LACTICIDVEN Microbiology Recent Results (from the past 240 hour(s))  Urine culture     Status: Abnormal (Preliminary result)   Collection Time: 02/24/18  7:38 AM  Result Value Ref Range Status   Specimen Description   Final    URINE, CLEAN CATCH Performed at [facility], [address], [place], [place] [address]    Special Requests   Final    NONE Performed at [facility], [address], [place], [place] [address]    Culture >=100,000 COLONIES/mL GRAM NEGATIVE RODS (A)  Final   Report Status PENDING  Incomplete     Time coordinating discharge: 40 minutes  SIGNED:   Erick Blinks, DO [facility] 02/25/2018, 11:31 AM Pager [phone]  If 7PM-7AM, please contact night-coverage [email or web address] Password [email or web address]

## 2018-02-25 NOTE — Progress Notes (Signed)
Pt discharged home today per Dr. Shah. Pt's IV site D/C'd and WDL. Pt's VSS. Pt provided with home medication list, discharge instructions and prescriptions. Verbalized understanding. Pt left floor via WC in stable condition accompanied by NT. 

## 2018-02-25 NOTE — Evaluation (Signed)
Speech Language Pathology Evaluation Patient Details Name: [name] [name] MRN: [id number] DOB: 04/19/1956 Today's Date: 02/25/2018 Time: [id number] SLP Time Calculation (min) (ACUTE ONLY): 22 min  Problem List:  Patient Active Problem List   Diagnosis Date Noted  . CVA (cerebral vascular accident) (HCC) 02/24/2018  . Essential hypertension 02/24/2018  . Diabetes (HCC) 02/24/2018  . Acute lower UTI 02/24/2018  . Acute osteomyelitis of right foot (HCC) 04/22/2016  . Wound infection 03/15/2016   Past Medical History:  Past Medical History:  Diagnosis Date  . Hypertension   . Microproteinuria   . Neuropathy   . Noncompliance   . Osteopenia   . Type 2 diabetes mellitus (HCC)    Past Surgical History:  Past Surgical History:  Procedure Laterality Date  . ABDOMINAL HYSTERECTOMY    . BACK SURGERY     HPI:  This is a 62 year old female with history of type 2 diabetes and hypertension who presented to the ED with complaints of right-sided facial numbness and tingling as well as some gait ataxia and mild left-sided hemiparesis.  She was noted to have a right dorsal medial pontine lacunar infarct on MRI and has been started on aspirin 325 mg as well as statin medication.  She was not given TPA in the emergency department as she appears to have woken up with much of her symptoms and initial time of onset was unclear.  She continues to have persistent diplopia as well as gait instability for which she has been seen by PT/OT with recommendations for eyepatch to wear at home as well as home health physical therapy with a walker to assist in her balance.  She has also been seen by neurology with recommendations to continue on full dose aspirin as well as low-dose statin which has been prescribed.  She was additionally noted to have UTI with gram-negative rods noted in urine culture for which she has received 2 days of Rocephin and will be discharged with 2 more days of ciprofloxacin to complete course  of treatment.   Assessment / Plan / Recommendation Clinical Impression  Pt presents with WNL cognitive linguistic skills in setting of acute CVA; Pt with mild attention difficulties which may be due to visual changes and lack of sleep. Pt with subjective report of "losing train of thought", however test scores WNL at this time. 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 She works predominantly on the computer and currently presents with visual deficits (diplopia), which will impact her ability to return to work. Pt also holds primary duties around the household (driving, grocery shopping, cooking, cleaning, and finances). Pt will be receiving HH PT/OT and at this time, do not suspect she will need SLP services, however [facility] once she is home to determine if it may be beneficial. SLP will sign off for acute services at this time.     SLP Assessment  SLP Recommendation/Assessment: Patient does not need any further Speech Lanaguage Pathology Services SLP Visit Diagnosis: Cognitive communication deficit (R41.841)    Follow Up Recommendations  None    Frequency and Duration           SLP Evaluation Cognition  Overall Cognitive Status: Impaired/Different from baseline Arousal/Alertness: Awake/alert Orientation Level: Oriented X4 Attention: Sustained Sustained Attention: Impaired Sustained Attention Impairment: Verbal complex Memory: Appears intact(4/4 item recall) Awareness: Appears intact Problem Solving: Appears intact Safety/Judgment: Appears intact  Comprehension  Auditory Comprehension Overall Auditory Comprehension: Appears within functional limits for tasks assessed Yes/No Questions: Within Functional Limits Commands: Within Functional Limits Conversation: Complex Interfering Components: Attention EffectiveTechniques: Repetition Visual Recognition/Discrimination Discrimination: (Pt c/o  diplopia) Reading Comprehension Reading Status: (WFL for simple sentences)    Expression Expression Primary Mode of Expression: Verbal Verbal Expression Overall Verbal Expression: Appears within functional limits for tasks assessed Initiation: No impairment Automatic Speech: Name;Social Response Level of Generative/Spontaneous Verbalization: Conversation Repetition: No impairment Naming: No impairment Pragmatics: No impairment Interfering Components: Attention Non-Verbal Means of Communication: Not applicable Written Expression Dominant Hand: Left Written Expression: Not tested   Oral / Motor  Oral Motor/Sensory Function Overall Oral Motor/Sensory Function: Within functional limits Motor Speech Overall Motor Speech: Appears within functional limits for tasks assessed Respiration: Within functional limits Phonation: Normal Resonance: Within functional limits Articulation: Within functional limitis Intelligibility: Intelligible Motor Planning: Witnin functional limits Motor Speech Errors: Not applicable   Thank you,  Havery Moros, CCC-SLP [phone]                     PORTER,DABNEY 02/25/2018, 1:31 PM

## 2018-02-26 LAB — URINE CULTURE: Culture: >=100000 — AB

## 2018-02-27 ENCOUNTER — Encounter: Payer: Self-pay | Admitting: Family Medicine

## 2018-02-27 ENCOUNTER — Ambulatory Visit (INDEPENDENT_AMBULATORY_CARE_PROVIDER_SITE_OTHER): Payer: BLUE CROSS/BLUE SHIELD | Admitting: Family Medicine

## 2018-02-27 VITALS — BP 142/80 | Ht 62.0 in | Wt 156.0 lb

## 2018-02-27 DIAGNOSIS — I6350 Cerebral infarction due to unspecified occlusion or stenosis of unspecified cerebral artery: Secondary | ICD-10-CM

## 2018-02-27 DIAGNOSIS — I639 Cerebral infarction, unspecified: Secondary | ICD-10-CM

## 2018-02-27 DIAGNOSIS — E785 Hyperlipidemia, unspecified: Secondary | ICD-10-CM | POA: Diagnosis not present

## 2018-02-27 DIAGNOSIS — E1169 Type 2 diabetes mellitus with other specified complication: Secondary | ICD-10-CM | POA: Diagnosis not present

## 2018-02-27 NOTE — Progress Notes (Signed)
   Subjective:    Patient ID: [name] [name], female    DOB: 04/23/1956, 62 y.o.   MRN: [id number] Patient seen testing within 48 hours from discharge from the hospital for appropriate transitional follow-up after having a variance a serious stroke   HPIFollow up hospitalization.  Patient continues to manifest unsteadiness.  She is using a walker to get around.  Physical therapy is scheduled shortly.  Patient experience a pontine stroke.  Also had a work-up to look for other arterial palpitations.  Negative carotid ultrasound.  MRA revealed no substantial arterial issues.  Patient notes ongoing double vision.  She also feels unsteady.  Family reports speech is intact and appropriate.  No periods of confusion.  Patient compliant with blood pressure medication.  Entire hospital record all consult notes.  All scans all test reviewed today in presence of patient   Double vision. Hospital gave note to return within 24- 48 hours after discharge and pt does not feel ready to go back to work.     Review of Systems No headache, no major weight loss or weight gain, no chest pain no back pain abdominal pain no change in bowel habits complete ROS otherwise negative     Objective:   Physical Exam  Alert and oriented, vitals reviewed and stable, NAD ENT-TM's and ext canals WNL bilat via otoscopic exam Soft palate, tonsils and post pharynx WNL via oropharyngeal exam Neck-symmetric, no masses; thyroid nonpalpable and nontender Pulmonary-no tachypnea or accessory muscle use; Clear without wheezes via auscultation Card--no abnrml murmurs, rhythm reg and rate WNL Carotid pulses symmetric, without bruits   Patient is squinting right thigh while talking he reports less difficulty with double vision.  No focal neurological deficits been gait is observed.  Some unsteadiness with turning and rising but overall using walker well      Assessment & Plan:  Impression status post pontine stroke.  Long  discussion held regarding acute management and long-term avoidance of combination some further strokes.  Patient to follow-up with neurologist in several weeks.  Patient maintain current medication.  Importance of statin use discussed with her psychosis.  Maintain physical therapy.  Will stretch out work excuse several weeks.  Patient encouraged to follow-up with eye doctor recheck in his weeks

## 2018-02-28 DIAGNOSIS — Z7984 Long term (current) use of oral hypoglycemic drugs: Secondary | ICD-10-CM | POA: Diagnosis not present

## 2018-02-28 DIAGNOSIS — E1142 Type 2 diabetes mellitus with diabetic polyneuropathy: Secondary | ICD-10-CM | POA: Diagnosis not present

## 2018-02-28 DIAGNOSIS — I10 Essential (primary) hypertension: Secondary | ICD-10-CM | POA: Diagnosis not present

## 2018-02-28 DIAGNOSIS — E1140 Type 2 diabetes mellitus with diabetic neuropathy, unspecified: Secondary | ICD-10-CM | POA: Diagnosis not present

## 2018-02-28 DIAGNOSIS — Z87440 Personal history of urinary (tract) infections: Secondary | ICD-10-CM | POA: Diagnosis not present

## 2018-02-28 DIAGNOSIS — E538 Deficiency of other specified B group vitamins: Secondary | ICD-10-CM | POA: Diagnosis not present

## 2018-02-28 DIAGNOSIS — H5461 Unqualified visual loss, right eye, normal vision left eye: Secondary | ICD-10-CM | POA: Diagnosis not present

## 2018-02-28 DIAGNOSIS — Z794 Long term (current) use of insulin: Secondary | ICD-10-CM | POA: Diagnosis not present

## 2018-02-28 DIAGNOSIS — I69354 Hemiplegia and hemiparesis following cerebral infarction affecting left non-dominant side: Secondary | ICD-10-CM | POA: Diagnosis not present

## 2018-02-28 DIAGNOSIS — Z8489 Family history of other specified conditions: Secondary | ICD-10-CM | POA: Diagnosis not present

## 2018-02-28 DIAGNOSIS — I69398 Other sequelae of cerebral infarction: Secondary | ICD-10-CM | POA: Diagnosis not present

## 2018-02-28 DIAGNOSIS — Z0289 Encounter for other administrative examinations: Secondary | ICD-10-CM

## 2018-02-28 DIAGNOSIS — Z7982 Long term (current) use of aspirin: Secondary | ICD-10-CM | POA: Diagnosis not present

## 2018-03-02 DIAGNOSIS — E1169 Type 2 diabetes mellitus with other specified complication: Secondary | ICD-10-CM | POA: Insufficient documentation

## 2018-03-02 DIAGNOSIS — E785 Hyperlipidemia, unspecified: Secondary | ICD-10-CM

## 2018-03-03 ENCOUNTER — Telehealth: Payer: Self-pay | Admitting: Family Medicine

## 2018-03-03 NOTE — Telephone Encounter (Signed)
Patient's husband dropped off FMLA on Friday to be filled out and faxed into her job when done.Please review form and fill in highlighted areas, date and sign. In your yellow folder.

## 2018-03-03 NOTE — Telephone Encounter (Signed)
Left verbal order on voicemail 

## 2018-03-03 NOTE — Telephone Encounter (Signed)
[facility](Leslie)called for verbal order for patient's physical therapy for 2 times a week for 3 weeks .Leslie-[phone]

## 2018-03-03 NOTE — Telephone Encounter (Signed)
yes

## 2018-03-04 DIAGNOSIS — Z87440 Personal history of urinary (tract) infections: Secondary | ICD-10-CM | POA: Diagnosis not present

## 2018-03-04 DIAGNOSIS — H5461 Unqualified visual loss, right eye, normal vision left eye: Secondary | ICD-10-CM | POA: Diagnosis not present

## 2018-03-04 DIAGNOSIS — I69354 Hemiplegia and hemiparesis following cerebral infarction affecting left non-dominant side: Secondary | ICD-10-CM | POA: Diagnosis not present

## 2018-03-04 DIAGNOSIS — E1140 Type 2 diabetes mellitus with diabetic neuropathy, unspecified: Secondary | ICD-10-CM | POA: Diagnosis not present

## 2018-03-04 DIAGNOSIS — Z7982 Long term (current) use of aspirin: Secondary | ICD-10-CM | POA: Diagnosis not present

## 2018-03-04 DIAGNOSIS — I69398 Other sequelae of cerebral infarction: Secondary | ICD-10-CM | POA: Diagnosis not present

## 2018-03-04 DIAGNOSIS — Z7984 Long term (current) use of oral hypoglycemic drugs: Secondary | ICD-10-CM | POA: Diagnosis not present

## 2018-03-04 DIAGNOSIS — I10 Essential (primary) hypertension: Secondary | ICD-10-CM | POA: Diagnosis not present

## 2018-03-05 DIAGNOSIS — E10319 Type 1 diabetes mellitus with unspecified diabetic retinopathy without macular edema: Secondary | ICD-10-CM | POA: Diagnosis not present

## 2018-03-05 DIAGNOSIS — E109 Type 1 diabetes mellitus without complications: Secondary | ICD-10-CM | POA: Diagnosis not present

## 2018-03-05 LAB — HM DIABETES EYE EXAM

## 2018-03-06 DIAGNOSIS — I69354 Hemiplegia and hemiparesis following cerebral infarction affecting left non-dominant side: Secondary | ICD-10-CM | POA: Diagnosis not present

## 2018-03-06 DIAGNOSIS — I69398 Other sequelae of cerebral infarction: Secondary | ICD-10-CM | POA: Diagnosis not present

## 2018-03-06 DIAGNOSIS — E1140 Type 2 diabetes mellitus with diabetic neuropathy, unspecified: Secondary | ICD-10-CM | POA: Diagnosis not present

## 2018-03-06 DIAGNOSIS — H5461 Unqualified visual loss, right eye, normal vision left eye: Secondary | ICD-10-CM | POA: Diagnosis not present

## 2018-03-06 DIAGNOSIS — Z7982 Long term (current) use of aspirin: Secondary | ICD-10-CM | POA: Diagnosis not present

## 2018-03-06 DIAGNOSIS — I10 Essential (primary) hypertension: Secondary | ICD-10-CM | POA: Diagnosis not present

## 2018-03-06 DIAGNOSIS — Z87440 Personal history of urinary (tract) infections: Secondary | ICD-10-CM | POA: Diagnosis not present

## 2018-03-06 DIAGNOSIS — Z7984 Long term (current) use of oral hypoglycemic drugs: Secondary | ICD-10-CM | POA: Diagnosis not present

## 2018-03-10 DIAGNOSIS — I69354 Hemiplegia and hemiparesis following cerebral infarction affecting left non-dominant side: Secondary | ICD-10-CM | POA: Diagnosis not present

## 2018-03-10 DIAGNOSIS — Z7982 Long term (current) use of aspirin: Secondary | ICD-10-CM | POA: Diagnosis not present

## 2018-03-10 DIAGNOSIS — E1140 Type 2 diabetes mellitus with diabetic neuropathy, unspecified: Secondary | ICD-10-CM | POA: Diagnosis not present

## 2018-03-10 DIAGNOSIS — Z87440 Personal history of urinary (tract) infections: Secondary | ICD-10-CM | POA: Diagnosis not present

## 2018-03-10 DIAGNOSIS — H5461 Unqualified visual loss, right eye, normal vision left eye: Secondary | ICD-10-CM | POA: Diagnosis not present

## 2018-03-10 DIAGNOSIS — I69398 Other sequelae of cerebral infarction: Secondary | ICD-10-CM | POA: Diagnosis not present

## 2018-03-10 DIAGNOSIS — I10 Essential (primary) hypertension: Secondary | ICD-10-CM | POA: Diagnosis not present

## 2018-03-10 DIAGNOSIS — Z7984 Long term (current) use of oral hypoglycemic drugs: Secondary | ICD-10-CM | POA: Diagnosis not present

## 2018-03-12 DIAGNOSIS — Z7982 Long term (current) use of aspirin: Secondary | ICD-10-CM | POA: Diagnosis not present

## 2018-03-12 DIAGNOSIS — Z87440 Personal history of urinary (tract) infections: Secondary | ICD-10-CM | POA: Diagnosis not present

## 2018-03-12 DIAGNOSIS — I69354 Hemiplegia and hemiparesis following cerebral infarction affecting left non-dominant side: Secondary | ICD-10-CM | POA: Diagnosis not present

## 2018-03-12 DIAGNOSIS — I10 Essential (primary) hypertension: Secondary | ICD-10-CM | POA: Diagnosis not present

## 2018-03-12 DIAGNOSIS — I69398 Other sequelae of cerebral infarction: Secondary | ICD-10-CM | POA: Diagnosis not present

## 2018-03-12 DIAGNOSIS — H5461 Unqualified visual loss, right eye, normal vision left eye: Secondary | ICD-10-CM | POA: Diagnosis not present

## 2018-03-12 DIAGNOSIS — E1140 Type 2 diabetes mellitus with diabetic neuropathy, unspecified: Secondary | ICD-10-CM | POA: Diagnosis not present

## 2018-03-12 DIAGNOSIS — Z7984 Long term (current) use of oral hypoglycemic drugs: Secondary | ICD-10-CM | POA: Diagnosis not present

## 2018-03-13 DIAGNOSIS — H5461 Unqualified visual loss, right eye, normal vision left eye: Secondary | ICD-10-CM | POA: Diagnosis not present

## 2018-03-13 DIAGNOSIS — I69354 Hemiplegia and hemiparesis following cerebral infarction affecting left non-dominant side: Secondary | ICD-10-CM | POA: Diagnosis not present

## 2018-03-13 DIAGNOSIS — I10 Essential (primary) hypertension: Secondary | ICD-10-CM | POA: Diagnosis not present

## 2018-03-13 DIAGNOSIS — Z7982 Long term (current) use of aspirin: Secondary | ICD-10-CM | POA: Diagnosis not present

## 2018-03-13 DIAGNOSIS — Z7984 Long term (current) use of oral hypoglycemic drugs: Secondary | ICD-10-CM | POA: Diagnosis not present

## 2018-03-13 DIAGNOSIS — I69398 Other sequelae of cerebral infarction: Secondary | ICD-10-CM | POA: Diagnosis not present

## 2018-03-13 DIAGNOSIS — Z87440 Personal history of urinary (tract) infections: Secondary | ICD-10-CM | POA: Diagnosis not present

## 2018-03-13 DIAGNOSIS — E1140 Type 2 diabetes mellitus with diabetic neuropathy, unspecified: Secondary | ICD-10-CM | POA: Diagnosis not present

## 2018-03-18 ENCOUNTER — Ambulatory Visit: Payer: BLUE CROSS/BLUE SHIELD | Admitting: Family Medicine

## 2018-03-18 DIAGNOSIS — I69398 Other sequelae of cerebral infarction: Secondary | ICD-10-CM | POA: Diagnosis not present

## 2018-03-18 DIAGNOSIS — I69354 Hemiplegia and hemiparesis following cerebral infarction affecting left non-dominant side: Secondary | ICD-10-CM | POA: Diagnosis not present

## 2018-03-18 DIAGNOSIS — H5461 Unqualified visual loss, right eye, normal vision left eye: Secondary | ICD-10-CM | POA: Diagnosis not present

## 2018-03-18 DIAGNOSIS — I10 Essential (primary) hypertension: Secondary | ICD-10-CM | POA: Diagnosis not present

## 2018-03-18 DIAGNOSIS — Z7982 Long term (current) use of aspirin: Secondary | ICD-10-CM | POA: Diagnosis not present

## 2018-03-18 DIAGNOSIS — Z87440 Personal history of urinary (tract) infections: Secondary | ICD-10-CM | POA: Diagnosis not present

## 2018-03-18 DIAGNOSIS — Z7984 Long term (current) use of oral hypoglycemic drugs: Secondary | ICD-10-CM | POA: Diagnosis not present

## 2018-03-18 DIAGNOSIS — E1140 Type 2 diabetes mellitus with diabetic neuropathy, unspecified: Secondary | ICD-10-CM | POA: Diagnosis not present

## 2018-03-19 DIAGNOSIS — Z87440 Personal history of urinary (tract) infections: Secondary | ICD-10-CM | POA: Diagnosis not present

## 2018-03-19 DIAGNOSIS — I10 Essential (primary) hypertension: Secondary | ICD-10-CM | POA: Diagnosis not present

## 2018-03-19 DIAGNOSIS — H5461 Unqualified visual loss, right eye, normal vision left eye: Secondary | ICD-10-CM | POA: Diagnosis not present

## 2018-03-19 DIAGNOSIS — Z7984 Long term (current) use of oral hypoglycemic drugs: Secondary | ICD-10-CM | POA: Diagnosis not present

## 2018-03-19 DIAGNOSIS — Z7982 Long term (current) use of aspirin: Secondary | ICD-10-CM | POA: Diagnosis not present

## 2018-03-19 DIAGNOSIS — E1140 Type 2 diabetes mellitus with diabetic neuropathy, unspecified: Secondary | ICD-10-CM | POA: Diagnosis not present

## 2018-03-19 DIAGNOSIS — Z029 Encounter for administrative examinations, unspecified: Secondary | ICD-10-CM

## 2018-03-19 DIAGNOSIS — I69354 Hemiplegia and hemiparesis following cerebral infarction affecting left non-dominant side: Secondary | ICD-10-CM | POA: Diagnosis not present

## 2018-03-19 DIAGNOSIS — I69398 Other sequelae of cerebral infarction: Secondary | ICD-10-CM | POA: Diagnosis not present

## 2018-03-20 ENCOUNTER — Encounter: Payer: Self-pay | Admitting: Family Medicine

## 2018-03-20 ENCOUNTER — Ambulatory Visit: Payer: BLUE CROSS/BLUE SHIELD | Admitting: Family Medicine

## 2018-03-20 VITALS — BP 165/84 | Ht 62.0 in | Wt 158.0 lb

## 2018-03-20 DIAGNOSIS — I10 Essential (primary) hypertension: Secondary | ICD-10-CM

## 2018-03-20 DIAGNOSIS — I639 Cerebral infarction, unspecified: Secondary | ICD-10-CM

## 2018-03-20 DIAGNOSIS — I6350 Cerebral infarction due to unspecified occlusion or stenosis of unspecified cerebral artery: Secondary | ICD-10-CM

## 2018-03-20 MED ORDER — LISINOPRIL 20 MG PO TABS: 20.0000 mg | ORAL_TABLET | Freq: Every day | ORAL | 1 refills | Status: DC

## 2018-03-20 NOTE — Progress Notes (Signed)
   Subjective:    Patient ID: [name] [name], female    DOB: 01/02/1956, 62 y.o.   MRN: [id number] Patient arrives office regarding follow-up from pontine stroke. HPI Patient is here today to follow up on stroke on July 14,2019.She states she thinks she is doing good. She feels "ok",but balance is not right. Had last visit with pt yesterday.   PT yesterday was the last visit has had multiple physical therapy visits.  Reports substantial improvement.  However still needing walker to move around for substantial distances.  Was told yesterday was the last one.  Was given exercise encouraged her to use them.  Had a speech therapy evaluation they felt further evaluation was not warranted.  However they did point out patient noticing some cognitive slowness at times.  Patient today admits that at times her thinking is not as clear as she would like.  Patient claims complete compliance with blood pressure medication.  Does not miss a dose.  Patient does not feel with her current unsteadiness that she can do her job at work.  This requires computer work during the day but also physical work to close up and she does not feel comfortable at this time doing  Utilizing walker with current activities       Review of Systems No headache, no major weight loss or weight gain, no chest pain no back pain abdominal pain no change in bowel habits complete ROS otherwise negative     Objective:   Physical Exam  Alert and oriented, vitals reviewed and stable, NAD ENT-TM's and ext canals WNL bilat via otoscopic exam Soft palate, tonsils and post pharynx WNL via oropharyngeal exam Neck-symmetric, no masses; thyroid nonpalpable and nontender Pulmonary-no tachypnea or accessory muscle use; Clear without wheezes via auscultation Card--no abnrml murmurs, rhythm reg and rate WNL Carotid pulses symmetric, without bruits       Assessment & Plan:  1 impression hypertension.  Suboptimal control discussed blood  strength less than the rationale discussed her graph #2 status post stroke.  [place].  Still experiencing gait abnormalities along with occasional mild fussiness.  Definitely not ready to go back to work yet rationale discussed work excuse to be extended 1 month follow-up with them  3.  Neurology referral disappointing.  Patient was told that at the hospital they left a message on Dr. Ronal Fearoonquah's machine at his office, but they have never heard anything back, they feel they do not have a lot of competence Dr. Vicenta Alyumas's office at this point, will refer to a different neurologist rationale discussed

## 2018-03-24 ENCOUNTER — Telehealth: Payer: Self-pay | Admitting: Family Medicine

## 2018-03-24 ENCOUNTER — Encounter: Payer: Self-pay | Admitting: Neurology

## 2018-03-24 DIAGNOSIS — I69398 Other sequelae of cerebral infarction: Secondary | ICD-10-CM | POA: Diagnosis not present

## 2018-03-24 DIAGNOSIS — H5461 Unqualified visual loss, right eye, normal vision left eye: Secondary | ICD-10-CM | POA: Diagnosis not present

## 2018-03-24 DIAGNOSIS — E1140 Type 2 diabetes mellitus with diabetic neuropathy, unspecified: Secondary | ICD-10-CM | POA: Diagnosis not present

## 2018-03-24 DIAGNOSIS — I69354 Hemiplegia and hemiparesis following cerebral infarction affecting left non-dominant side: Secondary | ICD-10-CM | POA: Diagnosis not present

## 2018-03-24 NOTE — Telephone Encounter (Signed)
Patient's spouse brought in FMLA at visit wanting them to be fill out for him to help wife with various appointment.Please review form and fill in highlighted areas.Wasnt sure what to put in duration of condition since waiting on new referral to neurology.I fill in what I could . Please date,sign in your yellow folder.

## 2018-03-27 ENCOUNTER — Telehealth: Payer: Self-pay | Admitting: Family Medicine

## 2018-03-27 NOTE — Telephone Encounter (Signed)
Patient had [employer] fax over disability form to be filled out.I filled out what I could ,please review form and fill in highlighted areas,date and sign in your yellow folder.

## 2018-04-07 DIAGNOSIS — E113393 Type 2 diabetes mellitus with moderate nonproliferative diabetic retinopathy without macular edema, bilateral: Secondary | ICD-10-CM | POA: Diagnosis not present

## 2018-04-07 DIAGNOSIS — H2513 Age-related nuclear cataract, bilateral: Secondary | ICD-10-CM | POA: Diagnosis not present

## 2018-04-07 DIAGNOSIS — H35432 Paving stone degeneration of retina, left eye: Secondary | ICD-10-CM | POA: Diagnosis not present

## 2018-04-07 DIAGNOSIS — H35033 Hypertensive retinopathy, bilateral: Secondary | ICD-10-CM | POA: Diagnosis not present

## 2018-04-15 DIAGNOSIS — E1142 Type 2 diabetes mellitus with diabetic polyneuropathy: Secondary | ICD-10-CM | POA: Diagnosis not present

## 2018-04-15 DIAGNOSIS — Z794 Long term (current) use of insulin: Secondary | ICD-10-CM | POA: Diagnosis not present

## 2018-04-15 DIAGNOSIS — E538 Deficiency of other specified B group vitamins: Secondary | ICD-10-CM | POA: Diagnosis not present

## 2018-04-15 DIAGNOSIS — Z8489 Family history of other specified conditions: Secondary | ICD-10-CM | POA: Diagnosis not present

## 2018-04-18 ENCOUNTER — Observation Stay (HOSPITAL_COMMUNITY)
Admission: EM | Admit: 2018-04-18 | Discharge: 2018-04-19 | Disposition: A | Discharge status: Home / Self Care | Payer: BLUE CROSS/BLUE SHIELD | Attending: Internal Medicine | Admitting: Internal Medicine

## 2018-04-18 ENCOUNTER — Emergency Department (HOSPITAL_COMMUNITY): Payer: BLUE CROSS/BLUE SHIELD

## 2018-04-18 ENCOUNTER — Encounter (HOSPITAL_COMMUNITY): Payer: Self-pay

## 2018-04-18 ENCOUNTER — Other Ambulatory Visit: Payer: Self-pay

## 2018-04-18 DIAGNOSIS — R9431 Abnormal electrocardiogram [ECG] [EKG]: Secondary | ICD-10-CM | POA: Diagnosis not present

## 2018-04-18 DIAGNOSIS — Z79899 Other long term (current) drug therapy: Secondary | ICD-10-CM | POA: Insufficient documentation

## 2018-04-18 DIAGNOSIS — E119 Type 2 diabetes mellitus without complications: Secondary | ICD-10-CM | POA: Diagnosis not present

## 2018-04-18 DIAGNOSIS — R112 Nausea with vomiting, unspecified: Principal | ICD-10-CM | POA: Diagnosis present

## 2018-04-18 DIAGNOSIS — Z7984 Long term (current) use of oral hypoglycemic drugs: Secondary | ICD-10-CM | POA: Insufficient documentation

## 2018-04-18 DIAGNOSIS — K567 Ileus, unspecified: Secondary | ICD-10-CM

## 2018-04-18 DIAGNOSIS — I10 Essential (primary) hypertension: Secondary | ICD-10-CM | POA: Diagnosis not present

## 2018-04-18 DIAGNOSIS — E785 Hyperlipidemia, unspecified: Secondary | ICD-10-CM | POA: Diagnosis not present

## 2018-04-18 DIAGNOSIS — E1169 Type 2 diabetes mellitus with other specified complication: Secondary | ICD-10-CM | POA: Diagnosis present

## 2018-04-18 DIAGNOSIS — I639 Cerebral infarction, unspecified: Secondary | ICD-10-CM

## 2018-04-18 DIAGNOSIS — E876 Hypokalemia: Secondary | ICD-10-CM | POA: Diagnosis not present

## 2018-04-18 DIAGNOSIS — Z8673 Personal history of transient ischemic attack (TIA), and cerebral infarction without residual deficits: Secondary | ICD-10-CM | POA: Insufficient documentation

## 2018-04-18 DIAGNOSIS — M6281 Muscle weakness (generalized): Secondary | ICD-10-CM | POA: Insufficient documentation

## 2018-04-18 HISTORY — DX: Cerebral infarction, unspecified: I63.9

## 2018-04-18 LAB — HEMOGLOBIN A1C
Hgb A1c MFr Bld: 9.3 % — ABNORMAL HIGH (ref 4.8–5.6)
MEAN PLASMA GLUCOSE: 220.21 mg/dL

## 2018-04-18 LAB — COMPREHENSIVE METABOLIC PANEL
ALT: 17 U/L (ref 0–44)
ANION GAP: 13 (ref 5–15)
AST: 25 U/L (ref 15–41)
Albumin: 3.9 g/dL (ref 3.5–5.0)
Alkaline Phosphatase: 58 U/L (ref 38–126)
BILIRUBIN TOTAL: 0.9 mg/dL (ref 0.3–1.2)
BUN: 12 mg/dL (ref 8–23)
CO2: 21 mmol/L — ABNORMAL LOW (ref 22–32)
Calcium: 9.2 mg/dL (ref 8.9–10.3)
Chloride: 104 mmol/L (ref 98–111)
Creatinine, Ser: 0.77 mg/dL (ref 0.44–1.00)
GFR calc non Af Amer: >60 mL/min (ref >60)
Glucose, Bld: 278 mg/dL — ABNORMAL HIGH (ref 70–99)
POTASSIUM: 3.4 mmol/L — AB (ref 3.5–5.1)
Sodium: 138 mmol/L (ref 135–145)
Total Protein: 8 g/dL (ref 6.5–8.1)

## 2018-04-18 LAB — MAGNESIUM: Magnesium: 1.4 mg/dL — ABNORMAL LOW (ref 1.7–2.4)

## 2018-04-18 LAB — URINALYSIS, ROUTINE W REFLEX MICROSCOPIC
Bacteria, UA: NONE SEEN
Bilirubin Urine: NEGATIVE
Glucose, UA: >=500 mg/dL — AB
Hgb urine dipstick: NEGATIVE
KETONES UR: 80 mg/dL — AB
LEUKOCYTES UA: NEGATIVE
Nitrite: NEGATIVE
PH: 5 (ref 5.0–8.0)
Protein, ur: 30 mg/dL — AB
SPECIFIC GRAVITY, URINE: 1.025 (ref 1.005–1.030)

## 2018-04-18 LAB — LIPASE, BLOOD: Lipase: 39 U/L (ref 11–51)

## 2018-04-18 LAB — CBC WITH DIFFERENTIAL/PLATELET
Basophils Absolute: 0 10*3/uL (ref 0.0–0.1)
Basophils Relative: 0 %
Eosinophils Absolute: 0.1 10*3/uL (ref 0.0–0.7)
Eosinophils Relative: 1 %
HEMATOCRIT: 39.8 % (ref 36.0–46.0)
HEMOGLOBIN: 13.3 g/dL (ref 12.0–15.0)
LYMPHS ABS: 1.4 10*3/uL (ref 0.7–4.0)
LYMPHS PCT: 13 %
MCH: 27.4 pg (ref 26.0–34.0)
MCHC: 33.4 g/dL (ref 30.0–36.0)
MCV: 82.1 fL (ref 78.0–100.0)
Monocytes Absolute: 0.4 10*3/uL (ref 0.1–1.0)
Monocytes Relative: 3 %
NEUTROS ABS: 9.2 10*3/uL — AB (ref 1.7–7.7)
NEUTROS PCT: 83 %
Platelets: 272 10*3/uL (ref 150–400)
RBC: 4.85 MIL/uL (ref 3.87–5.11)
RDW: 14.9 % (ref 11.5–15.5)
WBC: 11 10*3/uL — ABNORMAL HIGH (ref 4.0–10.5)

## 2018-04-18 LAB — TROPONIN I: Troponin I: <0.03 ng/mL (ref <0.03)

## 2018-04-18 LAB — GLUCOSE, CAPILLARY: GLUCOSE-CAPILLARY: 174 mg/dL — AB (ref 70–99)

## 2018-04-18 MED ORDER — PROCHLORPERAZINE EDISYLATE 10 MG/2ML IJ SOLN
10.0000 mg | Freq: Four times a day (QID) | INTRAMUSCULAR | Status: DC | PRN
  Administered 2018-04-18 (×2): 10 mg via INTRAVENOUS
  Filled 2018-04-18 (×2): qty 2

## 2018-04-18 MED ORDER — INSULIN ASPART 100 UNIT/ML ~~LOC~~ SOLN: 0.0000 [IU] | Freq: Three times a day (TID) | SUBCUTANEOUS | Status: DC

## 2018-04-18 MED ORDER — SODIUM CHLORIDE 0.9 % IV BOLUS
1000.0000 mL | Freq: Once | INTRAVENOUS | Status: AC
  Administered 2018-04-18: 1000 mL via INTRAVENOUS

## 2018-04-18 MED ORDER — POTASSIUM CHLORIDE 10 MEQ/100ML IV SOLN: 10.0000 meq | Freq: Once | INTRAVENOUS | Status: DC

## 2018-04-18 MED ORDER — SODIUM CHLORIDE 0.9 % IV SOLN
INTRAVENOUS | Status: DC
Start: 2018-04-18 — End: 2018-04-19
  Administered 2018-04-18 (×2): via INTRAVENOUS

## 2018-04-18 MED ORDER — LISINOPRIL 10 MG PO TABS
20.0000 mg | ORAL_TABLET | Freq: Every day | ORAL | Status: DC
  Administered 2018-04-18 – 2018-04-19 (×2): 20 mg via ORAL
  Filled 2018-04-18 (×3): qty 2

## 2018-04-18 MED ORDER — ACETAMINOPHEN 650 MG RE SUPP: 650.0000 mg | Freq: Four times a day (QID) | RECTAL | Status: DC | PRN

## 2018-04-18 MED ORDER — ATORVASTATIN CALCIUM 10 MG PO TABS
10.0000 mg | ORAL_TABLET | Freq: Every day | ORAL | Status: DC
  Administered 2018-04-18: 10 mg via ORAL
  Filled 2018-04-18 (×2): qty 1

## 2018-04-18 MED ORDER — MAGNESIUM SULFATE 2 GM/50ML IV SOLN
2.0000 g | Freq: Once | INTRAVENOUS | Status: AC
  Administered 2018-04-18: 2 g via INTRAVENOUS
  Filled 2018-04-18: qty 50

## 2018-04-18 MED ORDER — INSULIN DETEMIR 100 UNIT/ML ~~LOC~~ SOLN
6.0000 [IU] | Freq: Every day | SUBCUTANEOUS | Status: DC
  Administered 2018-04-18: 6 [IU] via SUBCUTANEOUS
  Filled 2018-04-18 (×3): qty 0.06

## 2018-04-18 MED ORDER — FAMOTIDINE IN NACL 20-0.9 MG/50ML-% IV SOLN
20.0000 mg | Freq: Once | INTRAVENOUS | Status: AC
  Administered 2018-04-18: 20 mg via INTRAVENOUS
  Filled 2018-04-18: qty 50

## 2018-04-18 MED ORDER — ACETAMINOPHEN 325 MG PO TABS: 650.0000 mg | ORAL_TABLET | Freq: Four times a day (QID) | ORAL | Status: DC | PRN

## 2018-04-18 MED ORDER — ONDANSETRON HCL 4 MG/2ML IJ SOLN
4.0000 mg | INTRAMUSCULAR | Status: AC | PRN
  Administered 2018-04-18 (×2): 4 mg via INTRAVENOUS
  Filled 2018-04-18 (×2): qty 2

## 2018-04-18 MED ORDER — POTASSIUM CHLORIDE CRYS ER 20 MEQ PO TBCR
40.0000 meq | EXTENDED_RELEASE_TABLET | Freq: Once | ORAL | Status: AC
Start: 2018-04-18 — End: 2018-04-18
  Administered 2018-04-18: 40 meq via ORAL
  Filled 2018-04-18: qty 2

## 2018-04-18 MED ORDER — PHENOL 1.4 % MT LIQD: 1.0000 | OROMUCOSAL | Status: DC | PRN

## 2018-04-18 MED ORDER — MECLIZINE HCL 12.5 MG PO TABS
12.5000 mg | ORAL_TABLET | Freq: Three times a day (TID) | ORAL | Status: DC
  Administered 2018-04-18 – 2018-04-19 (×3): 12.5 mg via ORAL
  Filled 2018-04-18 (×3): qty 1

## 2018-04-18 MED ORDER — PROMETHAZINE HCL 25 MG/ML IJ SOLN
12.5000 mg | Freq: Once | INTRAMUSCULAR | Status: AC
  Administered 2018-04-18: 12.5 mg via INTRAVENOUS
  Filled 2018-04-18: qty 1

## 2018-04-18 MED ORDER — HEPARIN SODIUM (PORCINE) 5000 UNIT/ML IJ SOLN
5000.0000 [IU] | Freq: Three times a day (TID) | INTRAMUSCULAR | Status: DC
  Administered 2018-04-18 – 2018-04-19 (×3): 5000 [IU] via SUBCUTANEOUS
  Filled 2018-04-18 (×3): qty 1

## 2018-04-18 NOTE — ED Triage Notes (Signed)
Pt reports that she woke approx 0130 vomiting. Mild abd pain

## 2018-04-18 NOTE — ED Provider Notes (Signed)
[facility] EMERGENCY DEPARTMENT Provider Note   CSN: [id number] Arrival date & time: 04/18/18  4098     History   Chief Complaint Chief Complaint  Patient presents with  . Emesis    HPI [name] [name] is a 62 y.o. female.  HPI  Pt was seen at 0725. Per pt, c/o gradual onset and persistence of multiple intermittent episodes of N/V that began approximately 0130am overnight last night. States she has had 2 BM's, but denies diarrhea. States her symptoms woke her up from sleep. Has been associated with generalized weakness. Denies abd pain, no CP/SOB, no back pain, no fevers, no black or blood in stools or emesis, no vertigo symptoms, no near syncope, no focal motor weakness, no change is her usual "balance issues" s/p recent CVA.     Past Medical History:  Diagnosis Date  . Hypertension   . Microproteinuria   . Neuropathy   . Noncompliance   . Osteopenia   . Stroke [facility])    residual "balance issues"  . Type 2 diabetes mellitus [facility])     Patient Active Problem List   Diagnosis Date Noted  . Hyperlipidemia associated with type 2 diabetes mellitus (HCC) 03/02/2018  . CVA (cerebral vascular accident) (HCC) 02/24/2018  . Essential hypertension 02/24/2018  . Diabetes (HCC) 02/24/2018  . Acute lower UTI 02/24/2018  . Acute osteomyelitis of right foot (HCC) 04/22/2016  . Wound infection 03/15/2016    Past Surgical History:  Procedure Laterality Date  . ABDOMINAL HYSTERECTOMY    . BACK SURGERY       OB History   None      Home Medications    Prior to Admission medications   Medication Sig Start Date End Date Taking? Authorizing Provider  atorvastatin (LIPITOR) 10 MG tablet Take 1 tablet (10 mg total) by mouth daily at 6 PM. 02/25/18 03/27/18  Sherryll Burger, Pratik D, DO  Cyanocobalamin (B-12 PO) Take 2 tablets by mouth daily.     [provider]  Insulin Glargine (LANTUS SOLOSTAR) 100 UNIT/ML Solostar Pen Inject 52 Units into the skin at bedtime.     [provider]  lisinopril (PRINIVIL,ZESTRIL) 20 MG tablet Take 1 tablet (20 mg total) by mouth daily. 03/20/18   Merlyn Albert, MD  metFORMIN (GLUCOPHAGE-XR) 500 MG 24 hr tablet Take 1 tablet by mouth 4 (four) times daily. 02/02/18   [provider]  Multiple Vitamins-Minerals (ONE-A-DAY WOMENS VITACRAVES) CHEW Chew 1 tablet by mouth daily.    [provider]    Family History Family History  Problem Relation Age of Onset  . Diabetes Mother     Social History Social History   Tobacco Use  . Smoking status: Never Smoker  . Smokeless tobacco: Never Used  Substance Use Topics  . Alcohol use: No  . Drug use: No     Allergies   Glipizide   Review of Systems Review of Systems ROS: Statement: All systems negative except as marked or noted in the HPI; Constitutional: Negative for fever and chills. +generalized weakness.; ; Eyes: Negative for eye pain, redness and discharge. ; ; ENMT: Negative for ear pain, hoarseness, nasal congestion, sinus pressure and sore throat. ; ; Cardiovascular: Negative for chest pain, palpitations, diaphoresis, dyspnea and peripheral edema. ; ; Respiratory: Negative for cough, wheezing and stridor. ; ; Gastrointestinal: +N/V. Negative for diarrhea, abdominal pain, blood in stool, hematemesis, jaundice and rectal bleeding. . ; ; Genitourinary: Negative for dysuria, flank pain and hematuria. ; ;  Musculoskeletal: Negative for back pain and neck pain. Negative for swelling and trauma.; ; Skin: Negative for pruritus, rash, abrasions, blisters, bruising and skin lesion.; ; Neuro: Negative for headache, lightheadedness and neck stiffness. Negative for altered level of consciousness, altered mental status, extremity weakness, paresthesias, involuntary movement, seizure and syncope.       Physical Exam Updated Vital Signs BP (!) 174/79 (BP Location: Left Arm)   Pulse 64   Temp (!) 97.5 F (36.4 C) (Oral)   Resp 16   Wt 71.7 kg   SpO2 99%   BMI  28.90 kg/m    Patient Vitals for the past 24 hrs:  BP Temp Temp src Pulse Resp SpO2 Weight  04/18/18 0930 (!) 152/74 - - 64 17 95 % -  04/18/18 0803 (!) 156/80 - - 90 16 98 % -  04/18/18 0730 (!) 174/79 - - 64 16 99 % -  04/18/18 0723 (!) 204/77 (!) 97.5 F (36.4 C) Oral (!) 59 18 98 % -  04/18/18 0720 - - - - - - 71.7 kg     Physical Exam 0730: Physical examination:  Nursing notes reviewed; Vital signs and O2 SAT reviewed;  Constitutional: Well developed, Well nourished, In no acute distress. Appears fatigued; Head:  Normocephalic, atraumatic; Eyes: EOMI, PERRL, No scleral icterus; ENMT: Mouth and pharynx normal, Mucous membranes dry; Neck: Supple, Full range of motion, No lymphadenopathy; Cardiovascular: Regular rate and rhythm, No gallop; Respiratory: Breath sounds clear & equal bilaterally, No wheezes.  Speaking full sentences with ease, Normal respiratory effort/excursion; Chest: Nontender, Movement normal; Abdomen: Soft, Nontender, Nondistended, Normal bowel sounds; Genitourinary: No CVA tenderness; Extremities: Peripheral pulses normal, No tenderness, No edema, No calf edema or asymmetry.; Neuro: AA&Ox3, Major CN grossly intact. No facial droop. Speech clear. No gross focal motor deficits in extremities.; Skin: Color normal, Warm, Dry.   ED Treatments / Results  Labs (all labs ordered are listed, but only abnormal results are displayed)   EKG EKG Interpretation  Date/Time:  Friday April 18 2018 08:02:49 EDT Ventricular Rate:  87 PR Interval:    QRS Duration: 96 QT Interval:  389 QTC Calculation: 468 R Axis:   36 Text Interpretation:  Sinus or ectopic atrial rhythm Low voltage, precordial leads Nonspecific T abnormalities, diffuse leads When compared with ECG of 02/24/2018 Diffuse Nonspecific T wave abnormality is now Present Confirmed by Samuel Jester [phone]) on 04/18/2018 8:24:55 AM    EKG Interpretation  Date/Time:  Friday April 18 2018 10:08:00  EDT Ventricular Rate:  66 PR Interval:    QRS Duration: 89 QT Interval:  435 QTC Calculation: 456 R Axis:   20 Text Interpretation:  Sinus rhythm Atrial premature complex Low voltage, precordial leads Borderline T abnormalities, anterior leads Since last tracing of earlier today Nonspecific T wave abnormality are less pronounced/improved Confirmed by Samuel Jester [phone]) on 04/18/2018 10:13:51 AM           Radiology   Procedures Procedures (including critical care time)  Medications Ordered in ED Medications  sodium chloride 0.9 % bolus 1,000 mL (1,000 mLs Intravenous New Bag/Given 04/18/18 0753)  ondansetron (ZOFRAN) injection 4 mg (4 mg Intravenous Given 04/18/18 0749)  famotidine (PEPCID) IVPB 20 mg premix (20 mg Intravenous New Bag/Given 04/18/18 0753)     Initial Impression / Assessment and Plan / ED Course  I have reviewed the triage vital signs and the nursing notes.  Pertinent labs & imaging results that were available during my care of the patient were reviewed  by me and considered in my medical decision making (see chart for details).  MDM Reviewed: previous chart, nursing note and vitals Reviewed previous: labs and ECG Interpretation: labs, ECG and x-ray   Results for orders placed or performed during the hospital encounter of 04/18/18  Lipase, blood  Result Value Ref Range   Lipase 39 11 - 51 U/L  Comprehensive metabolic panel  Result Value Ref Range   Sodium 138 135 - 145 mmol/L   Potassium 3.4 (L) 3.5 - 5.1 mmol/L   Chloride 104 98 - 111 mmol/L   CO2 21 (L) 22 - 32 mmol/L   Glucose, Bld 278 (H) 70 - 99 mg/dL   BUN 12 8 - 23 mg/dL   Creatinine, Ser 1.61 0.44 - 1.00 mg/dL   Calcium 9.2 8.9 - 09.6 mg/dL   Total Protein 8.0 6.5 - 8.1 g/dL   Albumin 3.9 3.5 - 5.0 g/dL   AST 25 15 - 41 U/L   ALT 17 0 - 44 U/L   Alkaline Phosphatase 58 38 - 126 U/L   Total Bilirubin 0.9 0.3 - 1.2 mg/dL   GFR calc non Af Amer >60 >60 mL/min   GFR calc Af Amer >60 >60  mL/min   Anion gap 13 5 - 15  Urinalysis, Routine w reflex microscopic  Result Value Ref Range   Color, Urine YELLOW YELLOW   APPearance CLEAR CLEAR   Specific Gravity, Urine 1.025 1.005 - 1.030   pH 5.0 5.0 - 8.0   Glucose, UA >=500 (A) NEGATIVE mg/dL   Hgb urine dipstick NEGATIVE NEGATIVE   Bilirubin Urine NEGATIVE NEGATIVE   Ketones, ur 80 (A) NEGATIVE mg/dL   Protein, ur 30 (A) NEGATIVE mg/dL   Nitrite NEGATIVE NEGATIVE   Leukocytes, UA NEGATIVE NEGATIVE   RBC / HPF 0-5 0 - 5 RBC/hpf   WBC, UA 0-5 0 - 5 WBC/hpf   Bacteria, UA NONE SEEN NONE SEEN   Squamous Epithelial / LPF 0-5 0 - 5   Mucus PRESENT   CBC with Differential/Platelet  Result Value Ref Range   WBC 11.0 (H) 4.0 - 10.5 K/uL   RBC 4.85 3.87 - 5.11 MIL/uL   Hemoglobin 13.3 12.0 - 15.0 g/dL   HCT 04.5 40.9 - 81.1 %   MCV 82.1 78.0 - 100.0 fL   MCH 27.4 26.0 - 34.0 pg   MCHC 33.4 30.0 - 36.0 g/dL   RDW 91.4 78.2 - 95.6 %   Platelets 272 150 - 400 K/uL   Neutrophils Relative % 83 %   Neutro Abs 9.2 (H) 1.7 - 7.7 K/uL   Lymphocytes Relative 13 %   Lymphs Abs 1.4 0.7 - 4.0 K/uL   Monocytes Relative 3 %   Monocytes Absolute 0.4 0.1 - 1.0 K/uL   Eosinophils Relative 1 %   Eosinophils Absolute 0.1 0.0 - 0.7 K/uL   Basophils Relative 0 %   Basophils Absolute 0.0 0.0 - 0.1 K/uL  Troponin I  Result Value Ref Range   Troponin I <0.03 <0.03 ng/mL  Troponin I  Result Value Ref Range   Troponin I <0.03 <0.03 ng/mL  Magnesium  Result Value Ref Range   Magnesium 1.4 (L) 1.7 - 2.4 mg/dL   Ct Head Wo Contrast Result Date: 04/18/2018 CLINICAL DATA:  Nausea, vomiting, mild abdominal pain, recent stroke, hypertension, type II diabetes mellitus EXAM: CT HEAD WITHOUT CONTRAST TECHNIQUE: Contiguous axial images were obtained from the base of the skull through the vertex without intravenous contrast. Sagittal  and coronal MPR images reconstructed from axial data set. COMPARISON:  02/24/2018 CT head, MR brain 02/24/2018 FINDINGS:  Brain: Normal ventricular morphology. No midline shift or mass effect. Normal appearance of brain parenchyma. No intracranial hemorrhage, mass lesion, or evidence of acute infarction. No extra-axial fluid collections. Specifically, small pontine infarct identified on most recent MR brain is not visualized. Vascular: Minimal atherosclerotic calcification of internal carotid arteries at skull base. No hyperdense vessels. Skull: Intact Sinuses/Orbits: Clear Other: N/A IMPRESSION: No acute intracranial abnormalities. Electronically Signed   By: Ulyses Southward M.D.   On: 04/18/2018 10:06   Dg Abd Acute W/chest Result Date: 04/18/2018 CLINICAL DATA:  Nausea and vomiting with pain EXAM: DG ABDOMEN ACUTE W/ 1V CHEST COMPARISON:  CT abdomen and pelvis December 09, 2008; chest radiograph February 02, 2016 FINDINGS: PA chest: There is no edema or consolidation. The heart size and pulmonary vascularity are normal. No adenopathy. Supine and upright abdomen: There is moderate stool throughout the colon. There is relative paucity of gas. No bowel dilatation or air-fluid level to suggest bowel obstruction. No free air. There are vascular calcifications in the pelvis. IMPRESSION: Relative paucity of bowel gas. This is a finding that may be seen normally but also may be indicative enteritis or early ileus. Bowel obstruction not felt to be likely. No free air. No lung edema or consolidation. Iliac artery atherosclerotic change noted. Electronically Signed   By: Bretta Bang III M.D.   On: 04/18/2018 09:03    1035:  1st EKG when pt hypertensive with NS TWA inf and ant leads. As pt's BP improved, EKG changes now normalized. Pt continues to deny CP/SOB. Pt continues to c/o nausea despite multiple doses of IV meds. Husband concerned regarding d/c and pt's baseline ambulatory instability. Potassium and magnesium repleted.  T/C returned from [facility] Dr. Tenny Craw, case discussed, including:  HPI, pertinent PM/SHx, VS/PE, dx testing, ED course  and treatment:  Agrees regarding EKG's, pt can stay at [facility] for further evaluation.   1105:  T/C returned from [facility] Dr. Gwenlyn Perking, case discussed, including:  HPI, pertinent PM/SHx, VS/PE, dx testing, ED course and treatment:  Agreeable to admit.     Final Clinical Impressions(s) / ED Diagnoses   Final diagnoses:  None    ED Discharge Orders    None       Samuel Jester, DO 04/22/18 2440

## 2018-04-18 NOTE — ED Notes (Signed)
Given diet zero sprite and water.

## 2018-04-18 NOTE — H&P (Signed)
History and Physical    [name] [name] QVZ:[id number] DOB: 1956/04/07 DOA: 04/18/2018  Referring MD/NP/PA: Dr. Clarene Duke PCP: Merlyn Albert, MD  Patient coming from: Home  Chief Complaint: Nausea and vomiting  HPI: [name] [name] is a 62 y.o. female with a past medical history significant for hypertension, type 2 diabetes mellitus with neuropathy, prior history of stroke (son residual balance issues), hyperlipidemia and medication noncompliance; who presented to the emergency department secondary to nausea and vomiting.  Patient reports symptoms started very early this morning waking her up from sleep and has had difficulty keeping anything down since then.  Her family at bedside reported grandkids sick with some viral illness causing vomiting and diarrhea.  Patient denies chest pain, shortness of breath, abdominal pain, dysuria, hematuria, hematemesis, melena, hematochezia, new focal neurologic or motor deficits, fever and chills.  In the ED patient was found to be in acute distress, feeling miserable with the ongoing nausea and vomiting episodes.  Blood pressure was significantly elevated at first due to the inability of taking her medications with ongoing symptoms.  IV fluids provided, Zofran and Phenergan given for control of nausea.  Patient found with low potassium and magnesium and electrolytes repletion initiated.  TRH has been consulted to place patient in observation for further fluid resuscitation, symptoms management and electrolytes repletion prior to discharge home.  Of note, images studies done demonstrated no acute abdominal abnormalities and she has normal lipase and LFTs.  Past Medical/Surgical History: Past Medical History:  Diagnosis Date  . Hypertension   . Microproteinuria   . Neuropathy   . Noncompliance   . Osteopenia   . Stroke [facility])    residual "balance issues"  . Type 2 diabetes mellitus (HCC)     Past Surgical History:  Procedure Laterality Date  . ABDOMINAL  HYSTERECTOMY    . BACK SURGERY      Social History:  reports that she has never smoked. She has never used smokeless tobacco. She reports that she does not drink alcohol or use drugs.  Allergies: Allergies  Allergen Reactions  . Glipizide     Family History:  Family History  Problem Relation Age of Onset  . Diabetes Mother     Prior to Admission medications   Medication Sig Start Date End Date Taking? Authorizing Provider  atorvastatin (LIPITOR) 10 MG tablet Take 1 tablet (10 mg total) by mouth daily at 6 PM. 02/25/18 04/18/18 Yes Shah, Pratik D, DO  Cyanocobalamin (B-12 PO) Take 2 tablets by mouth daily.    Yes [provider]  Insulin Glargine (LANTUS SOLOSTAR) 100 UNIT/ML Solostar Pen Inject 52 Units into the skin at bedtime.    Yes [provider]  lisinopril (PRINIVIL,ZESTRIL) 20 MG tablet Take 1 tablet (20 mg total) by mouth daily. 03/20/18  Yes Merlyn Albert, MD  metFORMIN (GLUCOPHAGE-XR) 500 MG 24 hr tablet Take 1 tablet by mouth 4 (four) times daily. 02/02/18  Yes [provider]  Multiple Vitamins-Minerals (ONE-A-DAY WOMENS VITACRAVES) CHEW Chew 1 tablet by mouth daily.   Yes [provider]    Review of Systems:  Negative as otherwise mentioned in HPI.  Physical Exam: Vitals:   04/18/18 1030 04/18/18 1100 04/18/18 1253 04/18/18 1433  BP: (!) 128/59 (!) 108/51 (!) 152/74 138/65  Pulse: 87 76 69 70  Resp: 16 17 16 18   Temp:   97.8 F (36.6 C)   TempSrc:   Oral   SpO2: 96% 95% 100% 98%  Weight:  74 kg   Height:   5\' 2"  (1.575 m)     Constitutional: Somnolent after receiving Phenergan in the ED, still complaining of nausea.  No fever, no chills, no abdominal pain, no chest pain, no shortness of breath.   Eyes: PERRL, lids and conjunctivae normal ENMT: Mucous membranes slightly dry on examination.Marland Kitchen Posterior pharynx clear of any exudate or lesions. Normal dentition.  No thrush. Neck: normal, supple, no masses, no  thyromegaly Respiratory: clear to auscultation bilaterally, no wheezing, no crackles. Normal respiratory effort. No accessory muscle use.  Cardiovascular: Regular rate and rhythm, no murmurs / rubs / gallops. No extremity edema. 2+ pedal pulses. No carotid bruits.  Abdomen: no tenderness, no masses palpated. No hepatosplenomegaly. Bowel sounds positive.  Musculoskeletal: no clubbing / cyanosis. No joint deformity upper and lower extremities. Good ROM, no contractures. Normal muscle tone.  Skin: no rashes, no petechiae, no open wounds, normal color, warm and dry. Neurologic: CN 2-12 grossly intact.  Alert, awake and oriented x3; speech clear, no focal motor or neurologic deficit appreciated on exam.    Psychiatric: Normal judgment and insight. Alert and oriented x 3. Normal mood.    Labs on Admission: I have personally reviewed the following labs and imaging studies  CBC: Recent Labs  Lab 04/18/18 0747  WBC 11.0*  NEUTROABS 9.2*  HGB 13.3  HCT 39.8  MCV 82.1  PLT 272   Basic Metabolic Panel: Recent Labs  Lab 04/18/18 0747 04/18/18 0937  NA 138  --   K 3.4*  --   CL 104  --   CO2 21*  --   GLUCOSE 278*  --   BUN 12  --   CREATININE 0.77  --   CALCIUM 9.2  --   MG  --  1.4*   GFR: Estimated Creatinine Clearance: 67 mL/min (by C-G formula based on SCr of 0.77 mg/dL).   Liver Function Tests: Recent Labs  Lab 04/18/18 0747  AST 25  ALT 17  ALKPHOS 58  BILITOT 0.9  PROT 8.0  ALBUMIN 3.9   Recent Labs  Lab 04/18/18 0747  LIPASE 39   Cardiac Enzymes: Recent Labs  Lab 04/18/18 0747 04/18/18 0937  TROPONINI <0.03 <0.03   Urine analysis:    Component Value Date/Time   COLORURINE YELLOW 04/18/2018 0753   APPEARANCEUR CLEAR 04/18/2018 0753   LABSPEC 1.025 04/18/2018 0753   PHURINE 5.0 04/18/2018 0753   GLUCOSEU >=500 (A) 04/18/2018 0753   HGBUR NEGATIVE 04/18/2018 0753   BILIRUBINUR NEGATIVE 04/18/2018 0753   KETONESUR 80 (A) 04/18/2018 0753   PROTEINUR 30  (A) 04/18/2018 0753   NITRITE NEGATIVE 04/18/2018 0753   LEUKOCYTESUR NEGATIVE 04/18/2018 0753   Radiological Exams on Admission: Ct Head Wo Contrast  Result Date: 04/18/2018 CLINICAL DATA:  Nausea, vomiting, mild abdominal pain, recent stroke, hypertension, type II diabetes mellitus EXAM: CT HEAD WITHOUT CONTRAST TECHNIQUE: Contiguous axial images were obtained from the base of the skull through the vertex without intravenous contrast. Sagittal and coronal MPR images reconstructed from axial data set. COMPARISON:  02/24/2018 CT head, MR brain 02/24/2018 FINDINGS: Brain: Normal ventricular morphology. No midline shift or mass effect. Normal appearance of brain parenchyma. No intracranial hemorrhage, mass lesion, or evidence of acute infarction. No extra-axial fluid collections. Specifically, small pontine infarct identified on most recent MR brain is not visualized. Vascular: Minimal atherosclerotic calcification of internal carotid arteries at skull base. No hyperdense vessels. Skull: Intact Sinuses/Orbits: Clear Other: N/A IMPRESSION: No acute intracranial abnormalities. Electronically Signed  By: Ulyses Southward M.D.   On: 04/18/2018 10:06   Dg Abd Acute W/chest  Result Date: 04/18/2018 CLINICAL DATA:  Nausea and vomiting with pain EXAM: DG ABDOMEN ACUTE W/ 1V CHEST COMPARISON:  CT abdomen and pelvis December 09, 2008; chest radiograph February 02, 2016 FINDINGS: PA chest: There is no edema or consolidation. The heart size and pulmonary vascularity are normal. No adenopathy. Supine and upright abdomen: There is moderate stool throughout the colon. There is relative paucity of gas. No bowel dilatation or air-fluid level to suggest bowel obstruction. No free air. There are vascular calcifications in the pelvis. IMPRESSION: Relative paucity of bowel gas. This is a finding that may be seen normally but also may be indicative enteritis or early ileus. Bowel obstruction not felt to be likely. No free air. No lung edema  or consolidation. Iliac artery atherosclerotic change noted. Electronically Signed   By: Bretta Bang III M.D.   On: 04/18/2018 09:03    EKG: Independently reviewed.  No acute ischemic changes appreciated.  Normal sinus rhythm, normal axis.  Assessment/Plan 1-intractable nausea and vomiting: With associated dehydration, and mild electrolyte abnormalities (low potassium and low magnesium). -Most likely in the setting of acute viral gastroenteritis versus gastroparesis. -Patient placed in observation for further fluid resuscitation, electrolytes repletion and symptom management. -PRN antiemetics and also schedule meclizine has been provided -Clear liquid diets with intention to advance slowly as tolerated. -no abd pain, no fever, no chills, normal LFT's.  2-CVA (cerebral vascular accident) (HCC) -No acute neurologic deficits appreciated -Continue risk factor modification.  3-Essential hypertension -Blood pressure elevated on presentation to ED due to lack of ability of tolerating home medications. -Resume oral antihypertensive agents -Follow vital signs.  4-Hyperlipidemia associated with type 2 diabetes mellitus (HCC) -Continue statins.  5-Hypokalemia and Hypomagnesemia -Due to GI losses secondary to problem #1 -Will replete electrolytes and follow trend.    6-GERD -famotidine given in ED -will start PPI  7-type 2 diabetes mellitus with hyperlipidemia -Will check A1c -Provide sliding scale insulin and Lantus -Hold oral hypoglycemic agents.  8-abnormal EKG at first presentation to ED -most likely associated with electrolytes disturbances -no CP, no SOB, normal troponin X2 -repeat EKG WNL   DVT prophylaxis: heparin  Code Status: Full code Family Communication: husband at bedside   Disposition Plan: most likely home in am, once able to tolerate diet and oral medications.  Consults called: none  Admission status: observation, LOS < 2 midnights, med-surg bed.    Time  Spent: 60 minutes  Vassie Loll MD [facility] Pager [phone]  If 7PM-7AM, please contact night-coverage [email or web address] Password [facility]  04/18/2018, 6:03 PM

## 2018-04-19 DIAGNOSIS — R112 Nausea with vomiting, unspecified: Secondary | ICD-10-CM | POA: Diagnosis not present

## 2018-04-19 DIAGNOSIS — I639 Cerebral infarction, unspecified: Secondary | ICD-10-CM | POA: Diagnosis not present

## 2018-04-19 DIAGNOSIS — R9431 Abnormal electrocardiogram [ECG] [EKG]: Secondary | ICD-10-CM

## 2018-04-19 DIAGNOSIS — I10 Essential (primary) hypertension: Secondary | ICD-10-CM | POA: Diagnosis not present

## 2018-04-19 LAB — CBC
HCT: 35.1 % — ABNORMAL LOW (ref 36.0–46.0)
Hemoglobin: 10.9 g/dL — ABNORMAL LOW (ref 12.0–15.0)
MCH: 26.5 pg (ref 26.0–34.0)
MCHC: 31.1 g/dL (ref 30.0–36.0)
MCV: 85.4 fL (ref 78.0–100.0)
Platelets: 223 10*3/uL (ref 150–400)
RBC: 4.11 MIL/uL (ref 3.87–5.11)
RDW: 15.4 % (ref 11.5–15.5)
WBC: 9.4 10*3/uL (ref 4.0–10.5)

## 2018-04-19 LAB — BASIC METABOLIC PANEL
ANION GAP: 7 (ref 5–15)
BUN: 7 mg/dL — ABNORMAL LOW (ref 8–23)
CALCIUM: 8.8 mg/dL — AB (ref 8.9–10.3)
CO2: 26 mmol/L (ref 22–32)
Chloride: 113 mmol/L — ABNORMAL HIGH (ref 98–111)
Creatinine, Ser: 0.66 mg/dL (ref 0.44–1.00)
GFR calc Af Amer: >60 mL/min (ref >60)
Glucose, Bld: 68 mg/dL — ABNORMAL LOW (ref 70–99)
POTASSIUM: 4.1 mmol/L (ref 3.5–5.1)
Sodium: 146 mmol/L — ABNORMAL HIGH (ref 135–145)

## 2018-04-19 LAB — GLUCOSE, CAPILLARY
GLUCOSE-CAPILLARY: 136 mg/dL — AB (ref 70–99)
Glucose-Capillary: 75 mg/dL (ref 70–99)

## 2018-04-19 LAB — URINE CULTURE: Culture: <10000 — AB

## 2018-04-19 LAB — MAGNESIUM: Magnesium: 2 mg/dL (ref 1.7–2.4)

## 2018-04-19 LAB — PHOSPHORUS: PHOSPHORUS: 3.5 mg/dL (ref 2.5–4.6)

## 2018-04-19 MED ORDER — PHENOL 1.4 % MT LIQD: 1.0000 | OROMUCOSAL | Status: DC | PRN

## 2018-04-19 MED ORDER — MECLIZINE HCL 12.5 MG PO TABS: 12.5000 mg | ORAL_TABLET | Freq: Three times a day (TID) | ORAL | 1 refills | Status: DC

## 2018-04-19 MED ORDER — ATORVASTATIN CALCIUM 10 MG PO TABS: 10.0000 mg | ORAL_TABLET | Freq: Every day | ORAL | 3 refills | Status: DC

## 2018-04-19 MED ORDER — ONDANSETRON 8 MG PO TBDP: 8.0000 mg | ORAL_TABLET | Freq: Three times a day (TID) | ORAL | 0 refills | Status: DC | PRN

## 2018-04-19 MED ORDER — PANTOPRAZOLE SODIUM 20 MG PO TBEC: 20.0000 mg | DELAYED_RELEASE_TABLET | Freq: Every day | ORAL | 1 refills | Status: DC

## 2018-04-19 NOTE — Plan of Care (Signed)
  Problem: Acute Rehab PT Goals(only PT should resolve) Goal: Patient Will Transfer Sit To/From Stand 04/19/2018 1035 by Glyn Ade, PT Flowsheets (Taken 04/19/2018 1035) Patient will transfer sit to/from stand: with supervision 04/19/2018 1034 by Glyn Ade, PT Outcome: Adequate for Discharge Goal: Pt Will Transfer Bed To Chair/Chair To Bed 04/19/2018 1035 by Glyn Ade, PT Flowsheets (Taken 04/19/2018 1035) Pt will Transfer Bed to Chair/Chair to Bed: with supervision 04/19/2018 1034 by Glyn Ade, PT Outcome: Adequate for Discharge Goal: Pt Will Ambulate 04/19/2018 1035 by Glyn Ade, PT Flowsheets (Taken 04/19/2018 1035) Pt will Ambulate: 50 feet; with supervision; with rolling walker 04/19/2018 1034 by Glyn Ade, PT Outcome: Adequate for Discharge

## 2018-04-19 NOTE — Evaluation (Signed)
Physical Therapy Evaluation Patient Details Name: [name] [name] MRN: [id number] DOB: Jun 23, 1956 Today's Date: 04/19/2018   History of Present Illness  [name] [name] is a 62 y.o. female with a past medical history significant for hypertension, type 2 diabetes mellitus with neuropathy, prior history of stroke (son residual balance issues), hyperlipidemia and medication noncompliance; who presented to the emergency department secondary to nausea and vomiting.  Patient reports symptoms started very early this morning waking her up from sleep and has had difficulty keeping anything down since then.  Her family at bedside reported grandkids sick with some viral illness causing vomiting and diarrhea.  Patient denies chest pain, shortness of breath, abdominal pain, dysuria, hematuria, hematemesis, melena, hematochezia, new focal neurologic or motor deficits, fever and chills.      Clinical Impression  [name] [name] is a 62 y.o. presenting for PT evaluation with recent decrease in functional mobility secondary to admission for nausea and vomitting. She is currently functioning slightly below her baseline of independent with no device, and now requires a supervision for transfers/gait with RW due to unsteadiness. She was able to perform multiple bouts of gait and transfers from bed and toilet this date with supervision using RW. She demonstrated unsteadiness with high level balance challenges and would benefit from outpatient physical therapy referral to address impairments and improve mobility. She will be discharged to RN staff for mobility while in acute setting and anticipate patient will be safe to return home when medically ready. Re-consult PT if there is a change in functional status/mobility.     Follow Up Recommendations Outpatient PT          Precautions / Restrictions Precautions Precautions: Fall Restrictions Weight Bearing Restrictions: No      Mobility  Bed Mobility Overal bed  mobility: Modified Independent    Transfers Overall transfer level: Needs assistance   Transfers: Sit to/from Stand Sit to Stand: Supervision Stand pivot transfers: Supervision    General transfer comment: transfers with supervision from edge of bed, toilet, with RW and no physical assistance  Ambulation/Gait Ambulation/Gait assistance: Supervision;Min guard Gait Distance (Feet): 65 Feet Assistive device: Rolling walker (2 wheeled) Gait Pattern/deviations: Step-through pattern;Decreased stride length     General Gait Details: slow gait pattern and unsteady with no device requiring min guard for safety, supervision with RW     Balance Overall balance assessment: Needs assistance   Sitting balance-Leahy Scale: Good Sitting balance - Comments: maintianed balance during dynamic seated activities with toileting     Standing balance-Leahy Scale: Good Standing balance comment: patient can perform dynamic standing activities wiht intermittent UE support at counter or RW Single Leg Stance - Right Leg: 2 Single Leg Stance - Left Leg: 2 Tandem Stance - Right Leg: 0 Tandem Stance - Left Leg: 0   [place] - Eyes Closed: 4 High level balance activites: Side stepping;Backward walking;Turns;Head turns High Level Balance Comments: Patient requires min assist/guard to prevent LOB with high level gait challenges and requires RW for support; patient able to pick up object form floor with min guard for safety          Pertinent Vitals/Pain Pain Assessment: No/denies pain    Home Living Family/patient expects to be discharged to:: Private residence Living Arrangements: Spouse/significant other Available Help at Discharge: Family;Friend(s);Available 24 hours/day Type of Home: House Home Access: Stairs to enter Entrance Stairs-Rails: None Entrance Stairs-Number of Steps: 2 Home Layout: One level Home Equipment: Walker - 2 wheels      Prior Function  Level of Independence: Independent     Comments: Recently concluded HHPT following stroke last July, no longer requries RW for ambulation. Is hoping to return to work.     Hand Dominance   Dominant Hand: Left    Extremity/Trunk Assessment   Upper Extremity Assessment Upper Extremity Assessment: Overall WFL for tasks assessed    Lower Extremity Assessment Lower Extremity Assessment: Overall WFL for tasks assessed    Cervical / Trunk Assessment Cervical / Trunk Assessment: Kyphotic  Communication   Communication: No difficulties  Cognition Arousal/Alertness: Awake/alert Behavior During Therapy: WFL for tasks assessed/performed Overall Cognitive Status: Within Functional Limits for tasks assessed             Assessment/Plan    PT Assessment All further PT needs can be met in the next venue of care  PT Problem List Decreased strength;Decreased activity tolerance;Decreased balance;Decreased mobility;Decreased coordination       PT Treatment Interventions DME instruction;Balance training;Gait training;Functional mobility training    PT Goals (Current goals can be found in the Care Plan section)  Acute Rehab PT Goals Patient Stated Goal: to return home and get back to work PT Goal Formulation: With patient/family Time For Goal Achievement: 04/23/18 Potential to Achieve Goals: Good    Frequency Other (Comment)(1 time visit)    AM-PAC PT "6 Clicks" Daily Activity  Outcome Measure Difficulty turning over in bed (including adjusting bedclothes, sheets and blankets)?: None Difficulty moving from lying on back to sitting on the side of the bed? : None Difficulty sitting down on and standing up from a chair with arms (e.g., wheelchair, bedside commode, etc,.)?: A Little Help needed moving to and from a bed to chair (including a wheelchair)?: A Little Help needed walking in hospital room?: A Little Help needed climbing 3-5 steps with a railing? : A Little 6 Click Score: 20    End of Session Equipment  Utilized During Treatment: Gait belt Activity Tolerance: Patient tolerated treatment well Patient left: in bed;with bed alarm set;with call bell/phone within reach;with family/visitor present Nurse Communication: Mobility status PT Visit Diagnosis: Unsteadiness on feet (R26.81);Difficulty in walking, not elsewhere classified (R26.2);Other abnormalities of gait and mobility (R26.89)    Time: 9629-5284 PT Time Calculation (min) (ACUTE ONLY): 27 min   Charges:   PT Evaluation $PT Eval Low Complexity: 1 Low PT Treatments $Therapeutic Activity: 8-22 mins        Kipp Brood, PT, DPT Physical Therapist with [facility]  04/19/2018 10:25 AM

## 2018-04-19 NOTE — Care Management (Signed)
Outpatient PT referral sent for OPPT.  They will contact patient with plans.  Information placed on AVS.

## 2018-04-19 NOTE — Discharge Summary (Signed)
Physician Discharge Summary  [name] [name] IDP:[id number] DOB: [birth year] DOA: 04/18/2018  PCP: Merlyn Albert, MD  Admit date: 04/18/2018 Discharge date: 04/19/2018  Time spent: 30 minutes  Recommendations for Outpatient Follow-up:  1. Basic metabolic panel and magnesium level to reassess electrolytes trend. 2. Reassess blood pressure and further adjust antihypertensive regimen as needed 3. Continue to have close follow-up to patient's CBGs and A1c with further adjustment to hypoglycemic regimen as needed.   Discharge Diagnoses:  Principal Problem:   Intractable nausea and vomiting Active Problems:   CVA (cerebral vascular accident) (HCC)   Essential hypertension   Hyperlipidemia associated with type 2 diabetes mellitus (HCC)   Hypokalemia   Hypomagnesemia   HLD (hyperlipidemia)   Abnormal EKG   Discharge Condition: Stable and improved.  Patient has been discharged home with instruction to follow-up with PCP in 10 days.  Diet recommendation: Heart healthy and modified carbohydrate diet has been encouraged.  Filed Weights   04/18/18 0720 04/18/18 1253  Weight: 71.7 kg 74 kg    History of present illness:  62 y.o. female with a past medical history significant for hypertension, type 2 diabetes mellitus with neuropathy, prior history of stroke (son residual balance issues), hyperlipidemia and medication noncompliance; who presented to the emergency department secondary to nausea and vomiting.  Patient reports symptoms started very early this morning waking her up from sleep and has had difficulty keeping anything down since then.  Her family at bedside reported grandkids sick with some viral illness causing vomiting and diarrhea.  Patient denies chest pain, shortness of breath, abdominal pain, dysuria, hematuria, hematemesis, melena, hematochezia, new focal neurologic or motor deficits, fever and chills.  In the ED patient was found to be in acute distress, feeling miserable with the  ongoing nausea and vomiting episodes.  Blood pressure was significantly elevated at first due to the inability of taking her medications with ongoing symptoms.  IV fluids provided, Zofran and Phenergan given for control of nausea.  Patient found with low potassium and magnesium and electrolytes repletion initiated.  TRH has been consulted to place patient in observation for further fluid resuscitation, symptoms management and electrolytes repletion prior to discharge home.  Of note, images studies done demonstrated no acute abdominal abnormalities and she has normal lipase and LFTs  Hospital Course:  1-intractable nausea and vomiting: With associated hypokalemia and hypomagnesemia. -Appears to be associated with viral gastroenteritis. -Symptoms completely resolve with supportive care. -Patient diet has been advanced and tolerated prior to discharge. -As needed antiemetics and also meclizine 3 times daily has been prescribed at discharge.  -Patient instructed to follow-up with PCP in 2 days.  2-history of CVA -No acute neurologic deficits appreciated -Intact cranial nerve on examination -Continue risk factor modification and secondary prevention.  3-essential hypertension -Blood pressure has remained stable and will control after resumption of her home medication regimen. -Advised to follow heart healthy diet.  4-hyperlipidemia -Continue statins.  5-hypokalemia and hypomagnesemia -Associated with GI losses secondary to problem #1 -Electrolytes has been repleted within normal limits at discharge. -Recommending repeat basic metabolic panel and Mg at her follow-up visit, to reassess electrolytes trend.  6-gastroesophageal reflux disease -Discharged on PPI  7-type 2 diabetes mellitus with prior history of stroke and hyperlipidemia -A1C 9.3 (was 10.2 a month ago) -resume home insulin regimen and oral hypoglycemic agents -Outpatient follow-up with PCP to further adjust hypoglycemic  regimen as needed.  8-abnormal EKG at first presentation to ED -most likely associated with electrolyte disturbances -No chest  pain, no shortness of breath, negative troponin x2 -Repeat EKG within normal limits.  9-physical deconditioning and off-balance sensation -PT has seen patient and recommendations for outpatient PT provided -referral order given -instructions to use rolling walker for assistance provided; patient had RW at home.  Procedures:  See below for x-ray reports.  Consultations:  None  Discharge Exam: Vitals:   04/18/18 2206 04/19/18 0627  BP: 138/62 138/60  Pulse: 70 65  Resp: 16 16  Temp: 98.2 F (36.8 C) 98.2 F (36.8 C)  SpO2: 98% 96%    General: Alert, awake and oriented x3; denies chest pain, no shortness of breath, no further nausea vomiting.  Patient tolerating diet and despite feeling weak/deconditioned she expressed to be ready to go home. Cardiovascular: S1 and S2, no rubs, no gallops, no JVD. Respiratory: Good air movement bilaterally, no wheezing, no crackles. Abdomen: Soft, nontender, positive bowel sounds, no distention. Extremities: No edema, no cyanosis or clubbing.  Discharge Instructions   Discharge Instructions    Ambulatory referral to Physical Therapy   Complete by:  As directed    Diet - low sodium heart healthy   Complete by:  As directed    Diet Carb Modified   Complete by:  As directed    Discharge instructions   Complete by:  As directed    Keep yourself well-hydrated Take medication as prescribed Arrange follow-up with PCP in 10 days.     Allergies as of 04/19/2018      Reactions   Glipizide       Medication List    TAKE these medications   atorvastatin 10 MG tablet Commonly known as:  LIPITOR Take 1 tablet (10 mg total) by mouth daily at 6 PM.   B-12 PO Take 2 tablets by mouth daily.   LANTUS SOLOSTAR 100 UNIT/ML Solostar Pen Generic drug:  Insulin Glargine Inject 52 Units into the skin at bedtime.    lisinopril 20 MG tablet Commonly known as:  PRINIVIL,ZESTRIL Take 1 tablet (20 mg total) by mouth daily.   meclizine 12.5 MG tablet Commonly known as:  ANTIVERT Take 1 tablet (12.5 mg total) by mouth 3 (three) times daily.   metFORMIN 500 MG 24 hr tablet Commonly known as:  GLUCOPHAGE-XR Take 1 tablet by mouth 4 (four) times daily.   ondansetron 8 MG disintegrating tablet Commonly known as:  ZOFRAN-ODT Take 1 tablet (8 mg total) by mouth every 8 (eight) hours as needed for nausea or vomiting.   ONE-A-DAY WOMENS VITACRAVES Chew Chew 1 tablet by mouth daily.   pantoprazole 20 MG tablet Commonly known as:  PROTONIX Take 1 tablet (20 mg total) by mouth daily.   phenol 1.4 % Liqd Commonly known as:  CHLORASEPTIC Use as directed 1-2 sprays in the mouth or throat as needed for throat irritation / pain.      Allergies  Allergen Reactions  . Glipizide    Follow-up Information    [facility] Follow up.   Specialty:  Rehabilitation Why:  Please call for physical therapy appointment if you haven't received phone call by Wednesday. Contact information: [id number] [place] [address] [phone]       Merlyn Albert, MD. Schedule an appointment as soon as possible for a visit in 10 day(s).   Specialty:  Family Medicine Contact information: [address] [place] [address] [phone]           The results of significant  diagnostics from this hospitalization (including imaging, microbiology, ancillary and laboratory) are listed below for reference.    Significant Diagnostic Studies: Ct Head Wo Contrast  Result Date: 04/18/2018 CLINICAL DATA:  Nausea, vomiting, mild abdominal pain, recent stroke, hypertension, type II diabetes mellitus EXAM: CT HEAD WITHOUT CONTRAST TECHNIQUE: Contiguous axial images were obtained from the base of the skull through the vertex without intravenous  contrast. Sagittal and coronal MPR images reconstructed from axial data set. COMPARISON:  02/24/2018 CT head, MR brain 02/24/2018 FINDINGS: Brain: Normal ventricular morphology. No midline shift or mass effect. Normal appearance of brain parenchyma. No intracranial hemorrhage, mass lesion, or evidence of acute infarction. No extra-axial fluid collections. Specifically, small pontine infarct identified on most recent MR brain is not visualized. Vascular: Minimal atherosclerotic calcification of internal carotid arteries at skull base. No hyperdense vessels. Skull: Intact Sinuses/Orbits: Clear Other: N/A IMPRESSION: No acute intracranial abnormalities. Electronically Signed   By: Ulyses Southward M.D.   On: 04/18/2018 10:06   Dg Abd Acute W/chest  Result Date: 04/18/2018 CLINICAL DATA:  Nausea and vomiting with pain EXAM: DG ABDOMEN ACUTE W/ 1V CHEST COMPARISON:  CT abdomen and pelvis December 09, 2008; chest radiograph February 02, 2016 FINDINGS: PA chest: There is no edema or consolidation. The heart size and pulmonary vascularity are normal. No adenopathy. Supine and upright abdomen: There is moderate stool throughout the colon. There is relative paucity of gas. No bowel dilatation or air-fluid level to suggest bowel obstruction. No free air. There are vascular calcifications in the pelvis. IMPRESSION: Relative paucity of bowel gas. This is a finding that may be seen normally but also may be indicative enteritis or early ileus. Bowel obstruction not felt to be likely. No free air. No lung edema or consolidation. Iliac artery atherosclerotic change noted. Electronically Signed   By: Bretta Bang III M.D.   On: 04/18/2018 09:03    Microbiology: Recent Results (from the past 240 hour(s))  Urine culture     Status: Abnormal   Collection Time: 04/18/18  7:31 AM  Result Value Ref Range Status   Specimen Description   Final    URINE, CLEAN CATCH Performed at [facility], [address] [place], [place] [address]     Special Requests   Final    NONE Performed at [facility], [address] [place], [place] [address]    Culture (A)  Final    <10,000 COLONIES/mL INSIGNIFICANT GROWTH Performed at [facility], [address] [place], [place] [address]    Report Status 04/19/2018 FINAL  Final     Labs: Basic Metabolic Panel: Recent Labs  Lab 04/18/18 0747 04/18/18 0937 04/19/18 0520  NA 138  --  146*  K 3.4*  --  4.1  CL 104  --  113*  CO2 21*  --  26  GLUCOSE 278*  --  68*  BUN 12  --  7*  CREATININE 0.77  --  0.66  CALCIUM 9.2  --  8.8*  MG  --  1.4* 2.0  PHOS  --   --  3.5   Liver Function Tests: Recent Labs  Lab 04/18/18 0747  AST 25  ALT 17  ALKPHOS 58  BILITOT 0.9  PROT 8.0  ALBUMIN 3.9   Recent Labs  Lab 04/18/18 0747  LIPASE 39   CBC: Recent Labs  Lab 04/18/18 0747 04/19/18 0520  WBC 11.0* 9.4  NEUTROABS 9.2*  --   HGB 13.3 10.9*  HCT 39.8 35.1*  MCV 82.1 85.4  PLT 272 223   Cardiac Enzymes: Recent Labs  Lab 04/18/18 0747 04/18/18 0937  TROPONINI <0.03 <0.03   CBG: Recent Labs  Lab 04/18/18 2207 04/19/18 0754 04/19/18 1125  GLUCAP 174* 75 136*    Signed:  Vassie Loll MD.  [facility] 04/19/2018, 11:48 AM

## 2018-04-19 NOTE — Progress Notes (Signed)
Patient discharged home today per MD orders. Patient vital signs WDL. IV removed and site WDL. Discharge Instructions including follow up appointments, medications, and education reviewed with patient. Patient verbalizes understanding. Patient is transported out via wheelchair.  

## 2018-04-21 ENCOUNTER — Ambulatory Visit: Payer: BLUE CROSS/BLUE SHIELD | Admitting: Family Medicine

## 2018-04-23 ENCOUNTER — Encounter: Payer: Self-pay | Admitting: Family Medicine

## 2018-04-23 ENCOUNTER — Ambulatory Visit (INDEPENDENT_AMBULATORY_CARE_PROVIDER_SITE_OTHER): Payer: BLUE CROSS/BLUE SHIELD | Admitting: Family Medicine

## 2018-04-23 ENCOUNTER — Telehealth: Payer: Self-pay | Admitting: Family Medicine

## 2018-04-23 VITALS — BP 126/74 | Wt 161.6 lb

## 2018-04-23 DIAGNOSIS — I10 Essential (primary) hypertension: Secondary | ICD-10-CM

## 2018-04-23 DIAGNOSIS — I639 Cerebral infarction, unspecified: Secondary | ICD-10-CM

## 2018-04-23 NOTE — Telephone Encounter (Signed)
Patient work note was extended for two weeks. 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

## 2018-04-23 NOTE — Progress Notes (Signed)
   Subjective:    Patient ID: [name] [name], female    DOB: 1956-03-08, 62 y.o.   MRN: [id number]  HPI  Patient arrives for a follow up on recent stroke. Patient also reports she was in the hospital over the weekend for stomach virus and vertigo.  Was in hospital over wqeekend for gastroenteritis nd dehydr.  Dehydration was managed however the patient now feels she has gone backwards somewhat in her strength and coordination.  Having to walk slowly.  Feeling weak.  Notes ongoing unsteadiness, though overall has improved since the stroke  Of course neurology follow-up is still pending.  Pt has started driving   Seen by the eye doc   Pt notes  Review of Systems No headache, no major weight loss or weight gain, no chest pain no back pain abdominal pain no change in bowel habits complete ROS otherwise negative     Objective:   Physical Exam Alert and oriented, vitals reviewed and stable, NAD ENT-TM's and ext canals WNL bilat via otoscopic exam Soft palate, tonsils and post pharynx WNL via oropharyngeal exam Neck-symmetric, no masses; thyroid nonpalpable and nontender Pulmonary-no tachypnea or accessory muscle use; Clear without wheezes via auscultation Card--no abnrml murmurs, rhythm reg and rate WNL Carotid pulses symmetric, without bruits Abdomen hyperactive bowel sounds.  No discrete tenderness.  Mild sensitivity diffusely Patient walks slowly      Assessment & Plan:  Impression 1 status post hospitalization for gastritis with dehydration.  Clinically improved.  2.  Status post stroke.  See prior notes.  Still unsteady.  Still not ready to go back to work by no means  3.  Hypertension decent control discussed  We will extend work excuse for 2 more weeks.  Follow-up here in 6 weeks.  Follow-up with neurologist.  Multiple questions answered.  Greater than 50% of this 25 minute face to face visit was spent in counseling and discussion and coordination of care regarding the  above diagnosis/diagnosies

## 2018-04-23 NOTE — Telephone Encounter (Signed)
You have the beginning date somewhere based on prior excuses this is just anj extension ,  Return to work dateis 9 24

## 2018-04-27 ENCOUNTER — Encounter: Payer: Self-pay | Admitting: Family Medicine

## 2018-05-13 NOTE — Progress Notes (Signed)
NEUROLOGY CONSULTATION NOTE  [name] [name] MRN: [id number] DOB: [birth year]  Referring provider: Merlyn Albert, MD Primary care provider: Merlyn Albert, MD  Reason for consult:  stroke  HISTORY OF PRESENT ILLNESS: [name] [name] is a 62 year old left-handed female with hypertension, type 2 diabetes with neuropathy who presents for stroke.  History supplemented by hospital and referring provider's notes.  CT and MRI/MRA from hospitalization personally reviewed.  She is accompanied by her husband who supplements history.  She was admitted to [facility] from 02/24/18 to 02/25/18 for double vision, right sided facial numbness and tingling, left-sided weakness and unsteady gait.  CT of head showed no acute findings.  MRI of brain showed small right dorsal medial pontine infarct.  She did not receive tPA due to late presentation.  MRA of head demonstrated mild right and moderate left PCA stenosis; PICA, superior cerebellar arteries, basilar artery and vertebral arteries patent.  Carotid doppler revealed no hemodynamically significant ICA stenosis.  TTE showed EF 60-65% with no cardiac source of emboli.  Labs included LDL 78, Hgb A1c 10.1 and negative HIV.  She was discharged on ASA 325mg  daily and atorvastatin 10mg  daily.  She was also treated for UTI.  Since hospitalization, she has overall improved.  Diplopia has resolved.  However, she had a setback after developing gastroenteritis, for which she was treated in the hospital on 04/18/18.  CT of head showed no acute changes.  She was found to be dehydrated with low potassium and magnesium.  She now feels more fatigued again.  She has baseline unsteady gait due to diabetic neuropathy but feels a little more off-balance.  She was taken off of ASA 2 weeks ago for tooth extraction.  Since then, she has not been taking it daily.  She is concerned that it makes her blood "too thin".  PAST MEDICAL HISTORY: Past Medical History:  Diagnosis  Date  . Hypertension   . Microproteinuria   . Neuropathy   . Noncompliance   . Osteopenia   . Stroke [facility])    residual "balance issues"  . Type 2 diabetes mellitus (HCC)     PAST SURGICAL HISTORY: Past Surgical History:  Procedure Laterality Date  . ABDOMINAL HYSTERECTOMY    . BACK SURGERY      MEDICATIONS: Current Outpatient Medications on File Prior to Visit  Medication Sig Dispense Refill  . atorvastatin (LIPITOR) 10 MG tablet Take 1 tablet (10 mg total) by mouth daily at 6 PM. 30 tablet 3  . Cyanocobalamin (B-12 PO) Take 2 tablets by mouth daily.     . Insulin Glargine (LANTUS SOLOSTAR) 100 UNIT/ML Solostar Pen Inject 52 Units into the skin at bedtime.     Marland Kitchen lisinopril (PRINIVIL,ZESTRIL) 20 MG tablet Take 1 tablet (20 mg total) by mouth daily. 90 tablet 1  . metFORMIN (GLUCOPHAGE-XR) 500 MG 24 hr tablet Take 1 tablet by mouth 4 (four) times daily.  2  . Multiple Vitamins-Minerals (ONE-A-DAY WOMENS VITACRAVES) CHEW Chew 1 tablet by mouth daily.    . ondansetron (ZOFRAN ODT) 8 MG disintegrating tablet Take 1 tablet (8 mg total) by mouth every 8 (eight) hours as needed for nausea or vomiting. 20 tablet 0  . phenol (CHLORASEPTIC) 1.4 % LIQD Use as directed 1-2 sprays in the mouth or throat as needed for throat irritation / pain.     No current facility-administered medications on file prior to visit.     ALLERGIES: Allergies  Allergen Reactions  .  Glipizide     FAMILY HISTORY: Family History  Problem Relation Age of Onset  . Diabetes Mother     SOCIAL HISTORY: Social History   Socioeconomic History  . Marital status: Married    Spouse name: Not on file  . Number of children: Not on file  . Years of education: Not on file  . Highest education level: Not on file  Occupational History  . Not on file  Social Needs  . Financial resource strain: Not on file  . Food insecurity:    Worry: Not on file    Inability: Not on file  . Transportation needs:    Medical:  Not on file    Non-medical: Not on file  Tobacco Use  . Smoking status: Never Smoker  . Smokeless tobacco: Never Used  Substance and Sexual Activity  . Alcohol use: No  . Drug use: No  . Sexual activity: Not on file  Lifestyle  . Physical activity:    Days per week: Not on file    Minutes per session: Not on file  . Stress: Not on file  Relationships  . Social connections:    Talks on phone: Not on file    Gets together: Not on file    Attends religious service: Not on file    Active member of club or organization: Not on file    Attends meetings of clubs or organizations: Not on file    Relationship status: Not on file  . Intimate partner violence:    Fear of current or ex partner: Not on file    Emotionally abused: Not on file    Physically abused: Not on file    Forced sexual activity: Not on file  Other Topics Concern  . Not on file  Social History Narrative  . Not on file    REVIEW OF SYSTEMS: Constitutional: fatigue Eyes: No visual changes, double vision, eye pain Ear, nose and throat: No hearing loss, ear pain, nasal congestion, sore throat Cardiovascular: No chest pain, palpitations Respiratory:  No shortness of breath at rest or with exertion, wheezes GastrointestinaI: No nausea, vomiting, diarrhea, abdominal pain, fecal incontinence Genitourinary:  No dysuria, urinary retention or frequency Musculoskeletal:  Back pain Integumentary: No rash, pruritus, skin lesions Neurological: as above Psychiatric: No depression, insomnia, anxiety Endocrine: No palpitations, diaphoresis, mood swings, change in appetite, change in weight, increased thirst Hematologic/Lymphatic:  No purpura, petechiae. Allergic/Immunologic: no itchy/runny eyes, nasal congestion, recent allergic reactions, rashes  PHYSICAL EXAM: Blood pressure 126/64, pulse 78, height 5\' 2"  (1.575 m), weight 155 lb (70.3 kg), SpO2 98 %. General: No acute distress.  Patient appears well-groomed.  Head:   Normocephalic/atraumatic Eyes:  fundi examined but not visualized Neck: supple, no paraspinal tenderness, full range of motion Back: No paraspinal tenderness Heart: regular rate and rhythm Lungs: Clear to auscultation bilaterally. Vascular: No carotid bruits. Neurological Exam: Mental status: alert and oriented to person, place, and time, recent and remote memory intact, fund of knowledge intact, attention and concentration intact, speech fluent and not dysarthric, language intact. Cranial nerves: CN I: not tested CN II: pupils equal, round and reactive to light, visual fields intact CN III, IV, VI:  full range of motion, no nystagmus, no ptosis CN V: facial sensation intact CN VII: upper and lower face symmetric CN VIII: hearing intact CN IX, X: gag intact, uvula midline CN XI: sternocleidomastoid and trapezius muscles intact CN XII: tongue midline Bulk & Tone: normal, no fasciculations. Motor:  5-/5 left  deltoid/triceps/biceps, otherwise 5/5 Sensation:  Pinprick sensation slightly reduced in right foot, and vibration sensation slightly reduced in feet. Deep Tendon Reflexes:  2+ throughout, left toe upward, right toe downward Finger to nose testing:  Without dysmetria.  Heel to shin:  Without dysmetria.  Gait:  Wide-based gait.  Able to turn, difficulty with tandem walk. Romberg with sway.  IMPRESSION: 1.  Right dorsal medial pontine infarct, likely secondary to small vessel disease but cannot rule out cardiac source. 2.  Hypertension 3.  Type 2 diabetes mellitus with neuropathy  PLAN: 1.  She may take ASA 81mg  daily for secondary stroke prevention 2.  Continue atorvastatin 10mg  daily (LDL goal should be less than 70) 3.  Continue blood pressure control 4.  Optimize glycemic control 5.  Mediterranean diet 6.  30 day cardiac event monitor to evaluate for paroxysmal atrial fibrillation. 7.  Follow up in 3 to 4 months  Thank you for allowing me to take part in the care of this  patient.  Shon Millet, DO  CC:  Merlyn Albert, MD

## 2018-05-14 ENCOUNTER — Ambulatory Visit (HOSPITAL_COMMUNITY)
Admission: RE | Admit: 2018-05-14 | Discharge: 2018-05-14 | Disposition: A | Discharge status: Home / Self Care | Payer: BLUE CROSS/BLUE SHIELD | Source: Ambulatory Visit | Attending: Family Medicine | Admitting: Family Medicine

## 2018-05-14 ENCOUNTER — Encounter: Payer: Self-pay | Admitting: Family Medicine

## 2018-05-14 ENCOUNTER — Ambulatory Visit (INDEPENDENT_AMBULATORY_CARE_PROVIDER_SITE_OTHER): Payer: BLUE CROSS/BLUE SHIELD | Admitting: Family Medicine

## 2018-05-14 VITALS — BP 132/74 | Temp 98.6°F | Ht 62.0 in | Wt 154.6 lb

## 2018-05-14 DIAGNOSIS — M546 Pain in thoracic spine: Secondary | ICD-10-CM

## 2018-05-14 DIAGNOSIS — R059 Cough, unspecified: Secondary | ICD-10-CM

## 2018-05-14 DIAGNOSIS — M47814 Spondylosis without myelopathy or radiculopathy, thoracic region: Secondary | ICD-10-CM | POA: Diagnosis not present

## 2018-05-14 DIAGNOSIS — R300 Dysuria: Secondary | ICD-10-CM | POA: Diagnosis not present

## 2018-05-14 DIAGNOSIS — M419 Scoliosis, unspecified: Secondary | ICD-10-CM | POA: Insufficient documentation

## 2018-05-14 DIAGNOSIS — R05 Cough: Secondary | ICD-10-CM | POA: Diagnosis not present

## 2018-05-14 LAB — POCT URINALYSIS DIPSTICK
PH UA: 6 (ref 5.0–8.0)
PROTEIN UA: POSITIVE — AB
Spec Grav, UA: 1.015 (ref 1.010–1.025)

## 2018-05-14 MED ORDER — TRAMADOL HCL 50 MG PO TABS: 50.0000 mg | ORAL_TABLET | Freq: Three times a day (TID) | ORAL | 0 refills | Status: DC | PRN

## 2018-05-14 MED ORDER — CIPROFLOXACIN HCL 500 MG PO TABS: 500.0000 mg | ORAL_TABLET | Freq: Two times a day (BID) | ORAL | 0 refills | Status: DC

## 2018-05-14 NOTE — Progress Notes (Signed)
   Subjective:    Patient ID: [name] [name], female    DOB: May 25, 1956, 62 y.o.   MRN: [id number]  HPI  Patient arrives with dysuria for 3 days. Patient also reports ongoing cough and congestion for a while Results for orders placed or performed in visit on 05/14/18  POCT urinalysis dipstick  Result Value Ref Range   Color, UA     Clarity, UA     Glucose, UA     Bilirubin, UA     Ketones, UA     Spec Grav, UA 1.015 1.010 - 1.025   Blood, UA     pH, UA 6.0 5.0 - 8.0   Protein, UA Positive (A) Negative   Urobilinogen, UA     Nitrite, UA     Leukocytes, UA Large (3+) (A) Negative   Appearance     Odor     Pain with urination, no sig incr freqendy  Lot of cough intermittently.  No fever.  Cough generally nonproductive.   Has had bad cough intermittently   Pos dysuria some increased frequency.  Patient notes chronic upper mid back pain.  Worsening.  Sometimes worse with cough sometimes worse with motion.  No fever  Review of Systems No headache, no major weight loss or weight gain, no chest pain no  abdominal pain no change in bowel habits complete ROS otherwise negative     Objective:   Physical Exam Alert and oriented, vitals reviewed and stable, NAD ENT-TM's and ext canals WNL bilat via otoscopic exam Soft palate, tonsils and post pharynx WNL via oropharyngeal exam Neck-symmetric, no masses; thyroid nonpalpable and nontender Pulmonary-no tachypnea or accessory muscle use; Clear without wheezes via auscultation Card--no abnrml murmurs, rhythm reg and rate WNL Carotid pulses symmetric, without bruits No true CVA tenderness.  Positive upper mid thorax tenderness to percussion       Assessment & Plan:  Impression chronic thoracic pain with recent exacerbation.  Potentially related to #2 doubt related to #3 rale discussed with patient.  With chronic nature will x-ray spine  2.  Urinary tract infection.  Urinalysis reveals multiple white blood cells per high-power  field.  Will change antibiotic to cover both respiratory and urinary tract  3 respiratory this with protracted cough now productive.  Doubt related to back pain and Lasix and coughing  Greater than 50% of this 25 minute face to face visit was spent in counseling and discussion and coordination of care regarding the above diagnosis/diagnosies

## 2018-05-15 ENCOUNTER — Encounter: Payer: Self-pay | Admitting: Neurology

## 2018-05-15 ENCOUNTER — Ambulatory Visit (INDEPENDENT_AMBULATORY_CARE_PROVIDER_SITE_OTHER): Payer: BLUE CROSS/BLUE SHIELD | Admitting: Neurology

## 2018-05-15 VITALS — BP 126/64 | HR 78 | Ht 62.0 in | Wt 155.0 lb

## 2018-05-15 DIAGNOSIS — Z794 Long term (current) use of insulin: Secondary | ICD-10-CM

## 2018-05-15 DIAGNOSIS — E1142 Type 2 diabetes mellitus with diabetic polyneuropathy: Secondary | ICD-10-CM | POA: Diagnosis not present

## 2018-05-15 DIAGNOSIS — I6350 Cerebral infarction due to unspecified occlusion or stenosis of unspecified cerebral artery: Secondary | ICD-10-CM | POA: Diagnosis not present

## 2018-05-15 DIAGNOSIS — I10 Essential (primary) hypertension: Secondary | ICD-10-CM

## 2018-05-15 NOTE — Patient Instructions (Signed)
1.  Take aspirin 81mg  daily 2.  Continue atorvastatin 10mg  daily 3.  Continue blood pressure control 4.  Optimize diabetes control 5.  Will get 30 day cardiac event monitor 6.  Follow Mediterranean diet (see below) 7.  Follow up in 3 to 4 months.   Mediterranean Diet A Mediterranean diet refers to food and lifestyle choices that are based on the traditions of countries located on the [employer]. This way of eating has been shown to help prevent certain conditions and improve outcomes for people who have chronic diseases, like kidney disease and heart disease. What are tips for following this plan? Lifestyle  Cook and eat meals together with your family, when possible.  Drink enough fluid to keep your urine clear or pale yellow.  Be physically active every day. This includes: ? Aerobic exercise like running or swimming. ? Leisure activities like gardening, walking, or housework.  Get 7-8 hours of sleep each night.  If recommended by your health care provider, drink red wine in moderation. This means 1 glass a day for nonpregnant women and 2 glasses a day for men. A glass of wine equals 5 oz (150 mL). Reading food labels  Check the serving size of packaged foods. For foods such as rice and pasta, the serving size refers to the amount of cooked product, not dry.  Check the total fat in packaged foods. Avoid foods that have saturated fat or trans fats.  Check the ingredients list for added sugars, such as corn syrup. Shopping  At the grocery store, buy most of your food from the areas near the walls of the store. This includes: ? Fresh fruits and vegetables (produce). ? Grains, beans, nuts, and seeds. Some of these may be available in unpackaged forms or large amounts (in bulk). ? Fresh seafood. ? Poultry and eggs. ? Low-fat dairy products.  Buy whole ingredients instead of prepackaged foods.  Buy fresh fruits and vegetables in-season from local farmers markets.  Buy  frozen fruits and vegetables in resealable bags.  If you do not have access to quality fresh seafood, buy precooked frozen shrimp or canned fish, such as tuna, salmon, or sardines.  Buy small amounts of raw or cooked vegetables, salads, or olives from the deli or salad bar at your store.  Stock your pantry so you always have certain foods on hand, such as olive oil, canned tuna, canned tomatoes, rice, pasta, and beans. Cooking  Cook foods with extra-virgin olive oil instead of using butter or other vegetable oils.  Have meat as a side dish, and have vegetables or grains as your main dish. This means having meat in small portions or adding small amounts of meat to foods like pasta or stew.  Use beans or vegetables instead of meat in common dishes like chili or lasagna.  Experiment with different cooking methods. Try roasting or broiling vegetables instead of steaming or sauteing them.  Add frozen vegetables to soups, stews, pasta, or rice.  Add nuts or seeds for added healthy fat at each meal. You can add these to yogurt, salads, or vegetable dishes.  Marinate fish or vegetables using olive oil, lemon juice, garlic, and fresh herbs. Meal planning  Plan to eat 1 vegetarian meal one day each week. Try to work up to 2 vegetarian meals, if possible.  Eat seafood 2 or more times a week.  Have healthy snacks readily available, such as: ? Vegetable sticks with hummus. ? [place] yogurt. ? Fruit and nut trail mix.  Eat balanced meals throughout the week. This includes: ? Fruit: 2-3 servings a day ? Vegetables: 4-5 servings a day ? Low-fat dairy: 2 servings a day ? Fish, poultry, or lean meat: 1 serving a day ? Beans and legumes: 2 or more servings a week ? Nuts and seeds: 1-2 servings a day ? Whole grains: 6-8 servings a day ? Extra-virgin olive oil: 3-4 servings a day  Limit red meat and sweets to only a few servings a month What are my food choices?  Mediterranean  diet ? Recommended ? Grains: Whole-grain pasta. Brown rice. Bulgar wheat. Polenta. Couscous. Whole-wheat bread. [place]. ? Vegetables: Artichokes. Beets. Broccoli. Cabbage. Carrots. Eggplant. Green beans. Chard. Kale. Spinach. Onions. Leeks. Peas. Squash. Tomatoes. Peppers. Radishes. ? Fruits: Apples. Apricots. Avocado. Berries. Bananas. Cherries. Dates. Figs. Grapes. Lemons. Melon. Oranges. Peaches. Plums. Pomegranate. ? Meats and other protein foods: Beans. Almonds. Sunflower seeds. Pine nuts. Peanuts. [place]. Salmon. Scallops. Shrimp. [place]. Tilapia. Clams. Oysters. Eggs. ? Dairy: Low-fat milk. Cheese. Greek yogurt. ? Beverages: Water. Red wine. Herbal tea. ? Fats and oils: Extra virgin olive oil. Avocado oil. Grape seed oil. ? Sweets and desserts: [place] yogurt with honey. Baked apples. Poached pears. Trail mix. ? Seasoning and other foods: Basil. Cilantro. Coriander. Cumin. Mint. Parsley. Sage. Rosemary. Tarragon. Garlic. Oregano. Thyme. Pepper. Balsalmic vinegar. Tahini. Hummus. Tomato sauce. Olives. Mushrooms. ? Limit these ? Grains: Prepackaged pasta or rice dishes. Prepackaged cereal with added sugar. ? Vegetables: Deep fried potatoes (french fries). ? Fruits: Fruit canned in syrup. ? Meats and other protein foods: Beef. Pork. Lamb. Poultry with skin. Hot dogs. Bernie[place] Salines. ? Dairy: Ice cream. Sour cream. Whole milk. ? Beverages: Juice. Sugar-sweetened soft drinks. Beer. Liquor and spirits. ? Fats and oils: Butter. Canola oil. Vegetable oil. Beef fat (tallow). Lard. ? Sweets and desserts: Cookies. Cakes. Pies. Candy. ? Seasoning and other foods: Mayonnaise. Premade sauces and marinades. ? The items listed may not be a complete list. Talk with your dietitian about what dietary choices are right for you. Summary  The Mediterranean diet includes both food and lifestyle choices.  Eat a variety of fresh fruits and vegetables, beans, nuts, seeds, and whole grains.  Limit the amount of red  meat and sweets that you eat.  Talk with your health care provider about whether it is safe for you to drink red wine in moderation. This means 1 glass a day for nonpregnant women and 2 glasses a day for men. A glass of wine equals 5 oz (150 mL). This information is not intended to replace advice given to you by your health care provider. Make sure you discuss any questions you have with your health care provider. Document Released: 03/22/2016 Document Revised: 04/24/2016 Document Reviewed: 03/22/2016 Elsevier Interactive Patient Education  [employer].

## 2018-05-16 LAB — URINE CULTURE

## 2018-05-16 LAB — SPECIMEN STATUS REPORT

## 2018-05-28 ENCOUNTER — Ambulatory Visit (HOSPITAL_COMMUNITY): Payer: BLUE CROSS/BLUE SHIELD | Attending: Family Medicine

## 2018-05-28 ENCOUNTER — Encounter (HOSPITAL_COMMUNITY): Payer: Self-pay

## 2018-05-28 ENCOUNTER — Other Ambulatory Visit: Payer: Self-pay

## 2018-05-28 DIAGNOSIS — R2689 Other abnormalities of gait and mobility: Secondary | ICD-10-CM | POA: Diagnosis not present

## 2018-05-28 DIAGNOSIS — M6281 Muscle weakness (generalized): Secondary | ICD-10-CM | POA: Insufficient documentation

## 2018-05-28 NOTE — Therapy (Signed)
[facility] [address] [place], [place], [address] Phone: [phone]   Fax:  [phone]  Physical Therapy Evaluation  Patient Details  Name: [name] [name] MRN: [id number] Date of Birth: Dec 06, 1955 Referring Provider (PT): Merlyn Albert, MD   Encounter Date: 05/28/2018  PT End of Session - 05/28/18 1759    Visit Number  1    Number of Visits  9    Date for PT Re-Evaluation  06/25/18    Authorization Type  BCBS (90 visits per year, no auth required)    Authorization Time Period  05/28/18 - 06/25/18    Authorization - Visit Number  1    Authorization - Number of Visits  90    PT Start Time  1649    PT Stop Time  1728    PT Time Calculation (min)  39 min    Activity Tolerance  Patient tolerated treatment well    Behavior During Therapy  [facility] for tasks assessed/performed       Past Medical History:  Diagnosis Date  . Hypertension   . Microproteinuria   . Neuropathy   . Noncompliance   . Osteopenia   . Stroke [facility])    residual "balance issues"  . Type 2 diabetes mellitus (HCC)     Past Surgical History:  Procedure Laterality Date  . ABDOMINAL HYSTERECTOMY    . BACK SURGERY      There were no vitals filed for this visit.   Subjective Assessment - 05/28/18 1651    Subjective  Patient reports she feels like she does not have as much endurance or energy as she did prior to her stroke and her hospitalization in September. She reports she feels unsteady with walking at work and home and is somewhat fearful of going up/down stairs. She reports she would like to get stronger and move more normally and faster again.    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  No/denies         [facility] PT Assessment - 05/28/18 0001      Assessment    Medical Diagnosis  Weakness and Balance deficits    Referring Provider (PT)  Merlyn Albert, MD    Onset Date/Surgical Date  02/24/18    Hand Dominance  Left    Next MD Visit  06/04/18      Precautions   Precautions  None      Restrictions   Weight Bearing Restrictions  No      Balance Screen   Has the patient fallen in the past 6 months  Yes    How many times?  2   in June and 1 other time    Has the patient had a decrease in activity level because of a fear of falling?   Yes    Is the patient reluctant to leave their home because of a fear of falling?   No      Home Nurse, mental health  Private residence    Living Arrangements  Spouse/significant other    Available Help at Discharge  Family    Type of Home  House    Home Access  Stairs to enter    Entrance Stairs-Number of Steps  1   7  in the back   Entrance Stairs-Rails  None    Home Layout  One level;Laundry or work area in [employer], building services - 2 wheels;Shower seat    Additional Comments  replacing stair railin gin basement      Prior Function   Level of Independence  Independent    Vocation  Full time employment    Leisure  enjoys dancing, playing ball, bowling, tennis, banmitten, garden      Cognition   Overall Cognitive Status  Within Functional Limits for tasks assessed      Observation/Other Assessments   Focus on Therapeutic Outcomes (FOTO)   perform next session      Functional Tests   Functional tests  Single leg stance      Single Leg Stance   Comments  Rt LE = 2 sec, Lt LE = 4 sec      ROM / Strength   AROM / PROM / Strength  Strength      Strength   Strength Assessment Site  Hip;Knee;Ankle    Right Hip Flexion  4+/5    Right Hip Extension  4+/5    Right Hip ABduction  4+/5    Left Hip Flexion  4/5    Left Hip Extension  4/5    Left Hip ABduction  4/5    Right/Left Knee  Right;Left    Right Knee Flexion  4+/5    Right Knee Extension  5/5    Left Knee Flexion   4+/5    Left Knee Extension  4/5    Right Ankle Dorsiflexion  4+/5    Left Ankle Dorsiflexion  4/5      Transfers   Five time sit to stand comments   14.6 without UE assist      Ambulation/Gait   Ambulation/Gait  Yes    Ambulation/Gait Assistance  7: Independent    Ambulation Distance (Feet)  432 Feet     Assistive device  None    Gait Pattern  Step-through pattern;Decreased stride length;Wide base of support    Ambulation Surface  Level;Indoor    Gait velocity  1.06 m/s    Stairs  Yes    Stairs Assistance  6: Modified independent (Device/Increase time)    Stair Management Technique  One rail Right;Alternating pattern;Forwards   also step to pattern with no rails   Number of Stairs  4    Height of Stairs  6    Gait Comments  Patient is fearful of stair ambulation as she feels unsteady      Standardized Balance Assessment   Standardized Balance Assessment  Dynamic Gait Index      Dynamic Gait Index   Level Surface  Normal    Change in Gait Speed  Normal    Gait with Horizontal Head Turns  Mild Impairment    Gait with Vertical Head Turns  Mild Impairment    Gait and Pivot Turn  Normal    Step Over Obstacle  Mild Impairment    Step Around Obstacles  Mild Impairment    Steps  Mild Impairment    Total Score  19    DGI comment:  patient is unsteady with gait challenges and score of 19 is indicitive of fall risk       Objective measurements completed on examination: See above findings.    PT Education - 05/28/18 1756    Education Details  Educated on exam findings and appropriate plan for  PT. Discussed long term goal to transition to regular exercise program at gym or with dace classes.     Person(s) Educated  Patient    Methods  Explanation;Handout    Comprehension  Verbalized understanding       PT Short Term Goals - 05/28/18 1812      PT SHORT TERM GOAL #1   Title  Patient will be independent with HEP, updated PRN, to improve strength and activity tolerance.     Time  2    Period  Weeks    Status  New    Target Date  06/11/18      PT SHORT TERM GOAL #2   Title  Patient will be able to perform SLS on Bil LE's for 10 seconds to improve safety with gait and stair mobility as well as demonstrate improved balance for reduce fall risk.    Time  2    Period  Weeks    Status  New        PT Long Term Goals - 05/28/18 1813      PT LONG TERM GOAL #1   Title  Patient will ambulate at = or >1.2 m/s to demonstrate improve community access and safety with reduced fall risk during .     Time  4    Period  Weeks    Status  New    Target Date  06/25/18      PT LONG TERM GOAL #2   Title  Patient will improve limited muscle groups by 1/2 grade iwth MMT to demonstrate improvement in strength for improve endurance and activity tolerance.     Time  4    Period  Weeks    Status  New      PT LONG TERM GOAL #3   Title  Patient will score a 22/24 on DGI to indicate significant improvement in balance while walking and performing dynamic activities as well as show a decreased risk of falls.    Time  4    Period  Weeks    Status  New      PT LONG TERM GOAL #4   Title  Patient will ascend/descend 1 flight of stairs with alternating step pattern and 1 handrail to be able to access her downstairs at home. She will also report feeling less fearful of falling with stair negotiation.     Time  4    Period  Weeks    Status  New        Plan - 05/28/18 1803    Clinical Impression Statement  Ms. Vanhise presents for physical therapy evaluation for recent decrease in endurance and impaired balance. She has a significant history of CVA and DM. She reports 2 falls in the last 6 months and states she feels her energy and activity tolerance has been decreasing. Objective testing indicates she is at risk for falls and has decreased functional muscle strength and impaired balance. She will benefit from skilled PT interventions to address impairments and progress towards  goals to improve confidence with mobility and overall wellness by transitioning to regular exercise program.     History and Personal Factors relevant to plan of care:  pontine CVA, DM, neuropathy    Clinical Presentation  Stable    Clinical Presentation due to:  MMT, DGI, , SLS, clinical judgement    Clinical Decision Making  Low    Rehab Potential  Good    PT Frequency  2x / week  PT Duration  4 weeks    PT Treatment/Interventions  ADLs/Self Care Home Management;Gait training;Stair training;Neuromuscular re-education;Functional mobility training;Therapeutic activities;Patient/family education;Therapeutic exercise;Balance training;Manual techniques;Passive range of motion;Energy conservation    PT Next Visit Plan  Review goals with patient. Initiated balance training and hip strengthening. Provide HEP for balance with SLS and tandem at counter.    Consulted and Agree with Plan of Care  Patient       Patient will benefit from skilled therapeutic intervention in order to improve the following deficits and impairments:  Abnormal gait, Decreased balance, Difficulty walking, Decreased activity tolerance, Decreased endurance  Visit Diagnosis: Other abnormalities of gait and mobility  Muscle weakness (generalized)     Problem List Patient Active Problem List   Diagnosis Date Noted  . Abnormal EKG   . Intractable nausea and vomiting 04/18/2018  . Hypokalemia 04/18/2018  . Hypomagnesemia 04/18/2018  . HLD (hyperlipidemia) 04/18/2018  . Hyperlipidemia associated with type 2 diabetes mellitus (HCC) 03/02/2018  . CVA (cerebral vascular accident) (HCC) 02/24/2018  . Essential hypertension 02/24/2018  . Diabetes (HCC) 02/24/2018  . Acute lower UTI 02/24/2018  . Acute osteomyelitis of right foot (HCC) 04/22/2016  . Wound infection 03/15/2016    Valentino Saxon, PT, DPT Physical Therapist with [facility]  05/28/2018 6:23 PM    [facility] [address] [place], [place], [address] Phone: [phone]   Fax:  [phone]  Name: [name] [name] MRN: [id number] Date of Birth: [birth year]

## 2018-06-03 ENCOUNTER — Ambulatory Visit (HOSPITAL_COMMUNITY): Payer: BLUE CROSS/BLUE SHIELD

## 2018-06-03 ENCOUNTER — Encounter (HOSPITAL_COMMUNITY): Payer: Self-pay

## 2018-06-03 DIAGNOSIS — R2689 Other abnormalities of gait and mobility: Secondary | ICD-10-CM | POA: Diagnosis not present

## 2018-06-03 DIAGNOSIS — M6281 Muscle weakness (generalized): Secondary | ICD-10-CM | POA: Diagnosis not present

## 2018-06-03 NOTE — Therapy (Signed)
[facility] [address] [place], [place], [address] Phone: [phone]   Fax:  [phone]  Physical Therapy Treatment  Patient Details  Name: [name] [name] MRN: [id number] Date of Birth: 1955-09-09 Referring Provider (PT): Merlyn Albert, MD   Encounter Date: 06/03/2018  PT End of Session - 06/03/18 1743    Visit Number  2    Number of Visits  9    Date for PT Re-Evaluation  06/25/18    Authorization Type  BCBS (90 visits per year, no auth required)    Authorization Time Period  05/28/18 - 06/25/18    Authorization - Visit Number  2    Authorization - Number of Visits  90    PT Start Time  1738    PT Stop Time  1818    PT Time Calculation (min)  40 min    Activity Tolerance  Patient tolerated treatment well    Behavior During Therapy  [facility] for tasks assessed/performed       Past Medical History:  Diagnosis Date  . Hypertension   . Microproteinuria   . Neuropathy   . Noncompliance   . Osteopenia   . Stroke [facility])    residual "balance issues"  . Type 2 diabetes mellitus (HCC)     Past Surgical History:  Procedure Laterality Date  . ABDOMINAL HYSTERECTOMY    . BACK SURGERY      There were no vitals filed for this visit.  Subjective Assessment - 06/03/18 1741    Subjective  Pt stated she is tired today, following work.  Reports she broke her reading glasses and reports her eyes are strained.  Reports her sugar levels were down to 45 this morning, feels okay now.      Patient Stated Goals  I would like to move around normally and faster    Currently in Pain?  No/denies         [facility] Adult PT Treatment/Exercise - 06/03/18 0001      Exercises   Exercises  Knee/Hip      Knee/Hip Exercises: Standing   Hip Abduction  10 reps;Knee straight    Abduction Limitations  3" holds with intermittent HHA      Knee/Hip Exercises: Seated   Sit to Sand  10 reps;without UE support          Balance Exercises - 06/03/18  1754      Balance Exercises: Standing   Tandem Stance  Eyes open;3 reps;30 secs   partial stance for HEP comfort; 2nd: tandem; 3rd on foam   SLS  5 reps;Eyes open;Solid surface   Lt 2", Rt 3"   Marching Limitations  10x 3" alternating with intermittent HHA    Sit to Stand Time  10x        PT Education - 06/03/18 1752    Education Details  Reviewed goals and established HEP with education on benefits/purpose    Person(s) Educated  Patient    Methods  Explanation;Demonstration;Handout    Comprehension  Verbalized understanding;Returned demonstration       PT Short Term Goals - 05/28/18 1812      PT SHORT TERM GOAL #1   Title  Patient will be independent with HEP, updated PRN, to improve strength and activity tolerance.    Time  2    Period  Weeks    Status  New    Target Date  06/11/18      PT SHORT TERM GOAL #  2   Title  Patient will be able to perform SLS on Bil LE's for 10 seconds to improve safety with gait and stair mobility as well as demonstrate improved balance for reduce fall risk.    Time  2    Period  Weeks    Status  New        PT Long Term Goals - 05/28/18 1813      PT LONG TERM GOAL #1   Title  Patient will ambulate at = or >1.2 m/s to demonstrate improve community access and safety with reduced fall risk during .     Time  4    Period  Weeks    Status  New    Target Date  06/25/18      PT LONG TERM GOAL #2   Title  Patient will improve limited muscle groups by 1/2 grade iwth MMT to demonstrate improvement in strength for improve endurance and activity tolerance.     Time  4    Period  Weeks    Status  New      PT LONG TERM GOAL #3   Title  Patient will score a 22/24 on DGI to indicate significant improvement in balance while walking and performing dynamic activities as well as show a decreased risk of falls.    Time  4    Period  Weeks    Status  New      PT LONG TERM GOAL #4   Title  Patient will ascend/descend 1 flight of stairs with  alternating step pattern and 1 handrail to be able to access her downstairs at home. She will also report feeling less fearful of falling with stair negotiation.     Time  4    Period  Weeks    Status  New            Plan - 06/03/18 1821    Clinical Impression Statement  Reviewed goals.  Session focus on balance training and hip strengthening.  Pt able to demonstrate appropriate mechanics with all exercises following initial instructions.  Established HEP focusing on balance (SLS/tandem) and educated on importance of safe environment to complete to reduce risk of fall with verbalized understanding.     Rehab Potential  Good    PT Frequency  2x / week    PT Duration  4 weeks    PT Treatment/Interventions  ADLs/Self Care Home Management;Gait training;Stair training;Neuromuscular re-education;Functional mobility training;Therapeutic activities;Patient/family education;Therapeutic exercise;Balance training;Manual techniques;Passive range of motion;Energy conservation    PT Next Visit Plan  Add squats, sidestep and step up training next session.  Continue balance training and hip strengthening.     PT Home Exercise Plan  06/03/18: SLS and tandem stance       Patient will benefit from skilled therapeutic intervention in order to improve the following deficits and impairments:  Abnormal gait, Decreased balance, Difficulty walking, Decreased activity tolerance, Decreased endurance  Visit Diagnosis: Other abnormalities of gait and mobility  Muscle weakness (generalized)     Problem List Patient Active Problem List   Diagnosis Date Noted  . Abnormal EKG   . Intractable nausea and vomiting 04/18/2018  . Hypokalemia 04/18/2018  . Hypomagnesemia 04/18/2018  . HLD (hyperlipidemia) 04/18/2018  . Hyperlipidemia associated with type 2 diabetes mellitus (HCC) 03/02/2018  . CVA (cerebral vascular accident) (HCC) 02/24/2018  . Essential hypertension 02/24/2018  . Diabetes (HCC) 02/24/2018  .  Acute lower UTI 02/24/2018  . Acute osteomyelitis of right foot (  HCC) 04/22/2016  . Wound infection 03/15/2016   Becky Sax, LPTA; CBIS [phone]  Juel Burrow 06/03/2018, 6:32 PM  [facility] [address] [place], [place], [address] Phone: [phone]   Fax:  [phone]  Name: [name] [name] MRN: [id number] Date of Birth: 1956/07/18

## 2018-06-03 NOTE — Patient Instructions (Signed)
Tandem Stance    Right foot in front of left, heel touching toe both feet "straight ahead". Stand on Foot Triangle of Support with both feet. Balance in this position 30 seconds. Do with left foot in front of right.  Copyright  VHI. All rights reserved.   Single Leg Balance: Eyes Open    Stand on right leg with eyes open. Hold 30 seconds. 5 reps 2 times per day.  http://ggbe.exer.us/5   Copyright  VHI. All rights reserved.   

## 2018-06-04 ENCOUNTER — Encounter (HOSPITAL_COMMUNITY): Payer: Self-pay

## 2018-06-04 ENCOUNTER — Ambulatory Visit (HOSPITAL_COMMUNITY): Payer: BLUE CROSS/BLUE SHIELD

## 2018-06-04 ENCOUNTER — Encounter: Payer: Self-pay | Admitting: Family Medicine

## 2018-06-04 ENCOUNTER — Ambulatory Visit (INDEPENDENT_AMBULATORY_CARE_PROVIDER_SITE_OTHER): Payer: BLUE CROSS/BLUE SHIELD | Admitting: Family Medicine

## 2018-06-04 VITALS — BP 164/86 | Ht 62.0 in | Wt 158.0 lb

## 2018-06-04 VITALS — BP 185/76 | HR 83

## 2018-06-04 DIAGNOSIS — I10 Essential (primary) hypertension: Secondary | ICD-10-CM

## 2018-06-04 DIAGNOSIS — I639 Cerebral infarction, unspecified: Secondary | ICD-10-CM

## 2018-06-04 DIAGNOSIS — R059 Cough, unspecified: Secondary | ICD-10-CM

## 2018-06-04 DIAGNOSIS — R2689 Other abnormalities of gait and mobility: Secondary | ICD-10-CM | POA: Diagnosis not present

## 2018-06-04 DIAGNOSIS — I6350 Cerebral infarction due to unspecified occlusion or stenosis of unspecified cerebral artery: Secondary | ICD-10-CM

## 2018-06-04 DIAGNOSIS — R05 Cough: Secondary | ICD-10-CM

## 2018-06-04 DIAGNOSIS — M6281 Muscle weakness (generalized): Secondary | ICD-10-CM | POA: Diagnosis not present

## 2018-06-04 MED ORDER — TRIAMTERENE-HCTZ 37.5-25 MG PO CAPS: 1.0000 | ORAL_CAPSULE | Freq: Every day | ORAL | 5 refills | Status: DC

## 2018-06-04 NOTE — Progress Notes (Signed)
   Subjective:    Patient ID: [name] [name], female    DOB: 11-Aug-1956, 62 y.o.   MRN: [id number]  HPI    Review of Systems     Objective:   Physical Exam        Assessment & Plan:

## 2018-06-04 NOTE — Progress Notes (Signed)
   Subjective:    Patient ID: [name] [name], female    DOB: [birth year], 62 y.o.   MRN: [id number]  Hypertension  This is a chronic problem. Treatments tried: lisinopri; 20 mg. Compliance problems: takes meds every day, tries to eat better than what she use to,    pt states bp has been going up. Wants to discuss what time of day should she be taking bp med and if she should take with food.   Cough since starting lisinopril.   BP going up, pn not drinking enpough water.Marland Kitchenoften systolic goes to 409  Ongoing cough since institution of   Still slightly unsteady when walking.  Still facing impact from the stroke.  However overall coordination and strength has improved.  Back to walking.  Handling this overall relatively well.  Sees diabetic specialist Dr. Sharl Ma in [place].   Seeing Dr. Charise Killian November 12th for vision problems.  Leftover from the stroke.  Pt wants to get flu vaccine at work.     Review of Systems No headache, no major weight loss or weight gain, no chest pain no back pain abdominal pain no change in bowel habits complete ROS otherwise negative     Objective:   Physical Exam  Alert and oriented, vitals reviewed and stable, NAD ENT-TM's and ext canals WNL bilat via otoscopic exam Soft palate, tonsils and post pharynx WNL via oropharyngeal exam Neck-symmetric, no masses; thyroid nonpalpable and nontender Pulmonary-no tachypnea or accessory muscle use; Clear without wheezes via auscultation Card--no abnrml murmurs, rhythm reg and rate WNL Carotid pulses symmetric, without bruits       Assessment & Plan:  Impression hypertension.  Suboptimum control.  Also potential side effects with cough secondary to lisinopril.  Stop lisinopril.  Initiate diuretic rationale discussed  2.  Status post stroke.  Overall strength and coordination improved however still some visual symptoms.  Due to see optometrist soon.  3.  Hyperlipidemia.  Compliant with Lipitor no side  effects  Follow-up with pulmonary team to see specialist recommended.  Patient to get flu shots were per her request.

## 2018-06-04 NOTE — Therapy (Signed)
[facility] [address] [place], [place], [address] Phone: [phone]   Fax:  [phone]  Physical Therapy Treatment  Patient Details  Name: [name] [name] MRN: [id number] Date of Birth: 19-Jun-1956 Referring Provider (PT): Merlyn Albert, MD   Encounter Date: 06/04/2018  PT End of Session - 06/04/18 1324    Visit Number  3    Number of Visits  9    Date for PT Re-Evaluation  06/25/18    Authorization Type  [employer] (90 visits per year, no auth required)    Authorization Time Period  05/28/18 - 06/25/18    Authorization - Visit Number  3    Authorization - Number of Visits  90    PT Start Time  0815    PT Stop Time  0848    PT Time Calculation (min)  33 min    Activity Tolerance  Patient tolerated treatment well    Behavior During Therapy  [facility] for tasks assessed/performed       Past Medical History:  Diagnosis Date  . Hypertension   . Microproteinuria   . Neuropathy   . Noncompliance   . Osteopenia   . Stroke [facility])    residual "balance issues"  . Type 2 diabetes mellitus (HCC)     Past Surgical History:  Procedure Laterality Date  . ABDOMINAL HYSTERECTOMY    . BACK SURGERY      Vitals:   06/04/18 0849  BP: (!) 185/76  Pulse: 83    Subjective Assessment - 06/04/18 0815    Subjective  Pt stated she felt like she had improved gait following last session.  No reports of pain currently.  Has a doctor apt this morning and needs to leave a little early today.  Has not began HEP yet, had a busy night following first treatment.    Pertinent History  [place] CVA on 02/24/18, hospitalized for virus on 04/19/18    Patient Stated Goals  I would like to move around normally and faster    Currently in Pain?  No/denies                       [facility] Adult PT Treatment/Exercise - 06/04/18 0001      Exercises   Exercises  Knee/Hip      Knee/Hip Exercises: Standing   Heel Raises  10 reps    Heel Raises Limitations   heel and toe raises    Functional Squat  10 reps    Functional Squat Limitations  front of chair for mechanics      Knee/Hip Exercises: Seated   Sit to Sand  10 reps;without UE support          Balance Exercises - 06/04/18 0840      Balance Exercises: Standing   Tandem Stance  Eyes open;Foam/compliant surface;2 reps;30 secs    SLS  5 reps;Eyes open;Solid surface   BLE 3-4" max   Sidestepping  2 reps;Theraband   RTB   Marching Limitations  10x 5" alternating with intermittent HHA    Sit to Stand Time  10x          PT Short Term Goals - 05/28/18 1812      PT SHORT TERM GOAL #1   Title  Patient will be independent with HEP, updated PRN, to improve strength and activity tolerance.    Time  2    Period  Weeks    Status  New  Target Date  06/11/18      PT SHORT TERM GOAL #2   Title  Patient will be able to perform SLS on Bil LE's for 10 seconds to improve safety with gait and stair mobility as well as demonstrate improved balance for reduce fall risk.    Time  2    Period  Weeks    Status  New        PT Long Term Goals - 05/28/18 1813      PT LONG TERM GOAL #1   Title  Patient will ambulate at = or >1.2 m/s to demonstrate improve community access and safety with reduced fall risk during .     Time  4    Period  Weeks    Status  New    Target Date  06/25/18      PT LONG TERM GOAL #2   Title  Patient will improve limited muscle groups by 1/2 grade iwth MMT to demonstrate improvement in strength for improve endurance and activity tolerance.     Time  4    Period  Weeks    Status  New      PT LONG TERM GOAL #3   Title  Patient will score a 22/24 on DGI to indicate significant improvement in balance while walking and performing dynamic activities as well as show a decreased risk of falls.    Time  4    Period  Weeks    Status  New      PT LONG TERM GOAL #4   Title  Patient will ascend/descend 1 flight of stairs with alternating step pattern and 1  handrail to be able to access her downstairs at home. She will also report feeling less fearful of falling with stair negotiation.     Time  4    Period  Weeks    Status  New            Plan - 06/04/18 0857    Clinical Impression Statement  Session focus on balance training and hip strengthening.  Added heel/toe raises, sidestep with theraband and squats for gluteal strengthening and pre-gait strengthening.  Pt with most difficulty with SLS activities required HHA or min A from therapist for safety.  Pt reports she continues to have high BP (185/76 mmHg, HR at 83 bpm), encouraged pt to discuss wiht MD later today.  Pt had to leave early this session for MD apt, unable to complete full POC.  No reports of pain through session, was limited by fatgiue.      Rehab Potential  Good    PT Frequency  2x / week    PT Duration  4 weeks    PT Treatment/Interventions  ADLs/Self Care Home Management;Gait training;Stair training;Neuromuscular re-education;Functional mobility training;Therapeutic activities;Patient/family education;Therapeutic exercise;Balance training;Manual techniques;Passive range of motion;Energy conservation    PT Next Visit Plan  F/u wiht HEP compliance.  Begin step up training next session.  Continue balance training and hip strengthening.     PT Home Exercise Plan  06/03/18: SLS and tandem stance       Patient will benefit from skilled therapeutic intervention in order to improve the following deficits and impairments:  Abnormal gait, Decreased balance, Difficulty walking, Decreased activity tolerance, Decreased endurance  Visit Diagnosis: Other abnormalities of gait and mobility  Muscle weakness (generalized)     Problem List Patient Active Problem List   Diagnosis Date Noted  . Abnormal EKG   . Intractable nausea and  vomiting 04/18/2018  . Hypokalemia 04/18/2018  . Hypomagnesemia 04/18/2018  . HLD (hyperlipidemia) 04/18/2018  . Hyperlipidemia associated with type  2 diabetes mellitus (HCC) 03/02/2018  . CVA (cerebral vascular accident) (HCC) 02/24/2018  . Essential hypertension 02/24/2018  . Diabetes (HCC) 02/24/2018  . Acute lower UTI 02/24/2018  . Acute osteomyelitis of right foot (HCC) 04/22/2016  . Wound infection 03/15/2016   Becky Sax, LPTA; CBIS [phone]  Juel Burrow 06/04/2018, 9:01 AM   [facility] [address] [place], [place], [address] Phone: [phone]   Fax:  [phone]  Name: IFRAH VEST MRN: [id number] Date of Birth: 05-23-1956

## 2018-06-10 ENCOUNTER — Ambulatory Visit (HOSPITAL_COMMUNITY): Payer: BLUE CROSS/BLUE SHIELD

## 2018-06-10 ENCOUNTER — Encounter (HOSPITAL_COMMUNITY): Payer: Self-pay

## 2018-06-10 VITALS — BP 148/69 | HR 65

## 2018-06-10 DIAGNOSIS — M6281 Muscle weakness (generalized): Secondary | ICD-10-CM

## 2018-06-10 DIAGNOSIS — R2689 Other abnormalities of gait and mobility: Secondary | ICD-10-CM | POA: Diagnosis not present

## 2018-06-10 NOTE — Therapy (Signed)
[facility] [address] [place], [place], [address] Phone: [phone]   Fax:  [phone]  Physical Therapy Treatment  Patient Details  Name: [name] [name] MRN: [id number] Date of Birth: September 26, 1955 Referring Provider (PT): Merlyn Albert, MD   Encounter Date: 06/10/2018  PT End of Session - 06/10/18 1736    Visit Number  4    Number of Visits  9    Date for PT Re-Evaluation  06/25/18    Authorization Type  [employer]BS (90 visits per year, no auth required)    Authorization Time Period  05/28/18 - 06/25/18    Authorization - Visit Number  4    Authorization - Number of Visits  90    PT Start Time  1734    PT Stop Time  1820    PT Time Calculation (min)  46 min    Equipment Utilized During Treatment  Gait belt    Activity Tolerance  Patient tolerated treatment well    Behavior During Therapy  WFL for tasks assessed/performed       Past Medical History:  Diagnosis Date  . Hypertension   . Microproteinuria   . Neuropathy   . Noncompliance   . Osteopenia   . Stroke [facility])    residual "balance issues"  . Type 2 diabetes mellitus (HCC)     Past Surgical History:  Procedure Laterality Date  . ABDOMINAL HYSTERECTOMY    . BACK SURGERY      Vitals:   06/10/18 1822  BP: (!) 148/69  Pulse: 65    Subjective Assessment - 06/10/18 1736    Subjective  Pt stated she had a busy weekend, working on remodeling her home.  No reports of pain currently, just fatigued with tasks.  Has began HEP without questions    Pertinent History  [place] CVA on 02/24/18, hospitalized for virus on 04/19/18    Patient Stated Goals  I would like to move around normally and faster    Currently in Pain?  No/denies                       [facility] Adult PT Treatment/Exercise - 06/10/18 0001      Exercises   Exercises  Knee/Hip      Knee/Hip Exercises: Standing   Heel Raises  15 reps    Heel Raises Limitations  heel and toe raises incline slope     Lateral Step Up  Both;10 reps;Hand Hold: 1;Step Height: 4"    Forward Step Up  Both;10 reps;Hand Hold: 0;Step Height: 4"    Forward Step Up Limitations  slow control    Functional Squat  15 reps    Functional Squat Limitations  front of chair for mechanics    Other Standing Knee Exercises  wall arch with heel raise      Knee/Hip Exercises: Seated   Sit to Sand  10 reps;without UE support          Balance Exercises - 06/10/18 1803      Balance Exercises: Standing   Tandem Stance  Eyes open;Foam/compliant surface;2 reps;30 secs   last set wiht UE flexoin and dowel rod   SLS  5 reps;Eyes open;Solid surface   cone tapping 3RT BLE   Sidestepping  2 reps;Theraband   GTB   Marching Limitations  10x 5" alternating with intermittent HHA    Sit to Stand Time  10x  PT Short Term Goals - 05/28/18 1812      PT SHORT TERM GOAL #1   Title  Patient will be independent with HEP, updated PRN, to improve strength and activity tolerance.    Time  2    Period  Weeks    Status  New    Target Date  06/11/18      PT SHORT TERM GOAL #2   Title  Patient will be able to perform SLS on Bil LE's for 10 seconds to improve safety with gait and stair mobility as well as demonstrate improved balance for reduce fall risk.    Time  2    Period  Weeks    Status  New        PT Long Term Goals - 05/28/18 1813      PT LONG TERM GOAL #1   Title  Patient will ambulate at = or >1.2 m/s to demonstrate improve community access and safety with reduced fall risk during .     Time  4    Period  Weeks    Status  New    Target Date  06/25/18      PT LONG TERM GOAL #2   Title  Patient will improve limited muscle groups by 1/2 grade iwth MMT to demonstrate improvement in strength for improve endurance and activity tolerance.     Time  4    Period  Weeks    Status  New      PT LONG TERM GOAL #3   Title  Patient will score a 22/24 on DGI to indicate significant improvement in balance  while walking and performing dynamic activities as well as show a decreased risk of falls.    Time  4    Period  Weeks    Status  New      PT LONG TERM GOAL #4   Title  Patient will ascend/descend 1 flight of stairs with alternating step pattern and 1 handrail to be able to access her downstairs at home. She will also report feeling less fearful of falling with stair negotiation.     Time  4    Period  Weeks    Status  New            Plan - 06/10/18 1825    Clinical Impression Statement  Added step ups for functional strengthening.  Continued sessin focus with LE functional strengthening and balance training.  Pt continues to have difficulty with NBOS on dynamic surface and SLS activities.  Added cone tapping with SLS activites.  Pt reports change in BP medication but feels she continues to have high BP.  Vitals taken at [facility] with BP at 148/69 mmHg though pt feels those are not correct.  Encouraged pt to address with MD.      Rehab Potential  Good    PT Frequency  2x / week    PT Duration  4 weeks    PT Treatment/Interventions  ADLs/Self Care Home Management;Gait training;Stair training;Neuromuscular re-education;Functional mobility training;Therapeutic activities;Patient/family education;Therapeutic exercise;Balance training;Manual techniques;Passive range of motion;Energy conservation    PT Next Visit Plan  Next session add tandem and retro gait. Continue balance training and hip strengthening.     PT Home Exercise Plan  06/03/18: SLS and tandem stance       Patient will benefit from skilled therapeutic intervention in order to improve the following deficits and impairments:  Abnormal gait, Decreased balance, Difficulty walking, Decreased activity tolerance, Decreased endurance  Visit Diagnosis: Other abnormalities of gait and mobility  Muscle weakness (generalized)     Problem List Patient Active Problem List   Diagnosis Date Noted  . Abnormal EKG   . Intractable nausea  and vomiting 04/18/2018  . Hypokalemia 04/18/2018  . Hypomagnesemia 04/18/2018  . HLD (hyperlipidemia) 04/18/2018  . Hyperlipidemia associated with type 2 diabetes mellitus (HCC) 03/02/2018  . CVA (cerebral vascular accident) (HCC) 02/24/2018  . Essential hypertension 02/24/2018  . Diabetes (HCC) 02/24/2018  . Acute lower UTI 02/24/2018  . Acute osteomyelitis of right foot (HCC) 04/22/2016  . Wound infection 03/15/2016   Becky Sax, LPTA; CBIS [phone]  Juel Burrow 06/10/2018, 6:30 PM  [facility] [address] [place], [place], [address] Phone: [phone]   Fax:  [phone]  Name: [name] [name] MRN: [id number] Date of Birth: [birth year]

## 2018-06-12 ENCOUNTER — Telehealth (HOSPITAL_COMMUNITY): Payer: Self-pay

## 2018-06-12 ENCOUNTER — Ambulatory Visit (HOSPITAL_COMMUNITY): Payer: BLUE CROSS/BLUE SHIELD

## 2018-06-12 NOTE — Telephone Encounter (Signed)
Patient left a message to cancel her appt °

## 2018-06-17 ENCOUNTER — Ambulatory Visit (HOSPITAL_COMMUNITY): Payer: BLUE CROSS/BLUE SHIELD | Attending: Family Medicine | Admitting: Physical Therapy

## 2018-06-17 DIAGNOSIS — R2689 Other abnormalities of gait and mobility: Secondary | ICD-10-CM | POA: Insufficient documentation

## 2018-06-17 DIAGNOSIS — M6281 Muscle weakness (generalized): Secondary | ICD-10-CM

## 2018-06-17 NOTE — Therapy (Signed)
[facility] [address] [place], [place], [address] Phone: [phone]   Fax:  [phone]  Physical Therapy Treatment  Patient Details  Name: [name] [name] MRN: [id number] Date of Birth: November 07, 1955 Referring Provider (PT): Merlyn Albert, MD   Encounter Date: 06/17/2018  PT End of Session - 06/17/18 1740    Visit Number  5    Number of Visits  9    Date for PT Re-Evaluation  06/25/18    Authorization Type  BCBS (90 visits per year, no auth required)    Authorization Time Period  05/28/18 - 06/25/18    Authorization - Visit Number  5    Authorization - Number of Visits  90    PT Start Time  1700    PT Stop Time  1740    PT Time Calculation (min)  40 min    Equipment Utilized During Treatment  Gait belt    Activity Tolerance  Patient tolerated treatment well    Behavior During Therapy  WFL for tasks assessed/performed       Past Medical History:  Diagnosis Date  . Hypertension   . Microproteinuria   . Neuropathy   . Noncompliance   . Osteopenia   . Stroke [facility])    residual "balance issues"  . Type 2 diabetes mellitus (HCC)     Past Surgical History:  Procedure Laterality Date  . ABDOMINAL HYSTERECTOMY    . BACK SURGERY      There were no vitals filed for this visit.  Subjective Assessment - 06/17/18 1707    Subjective  Pt was late for appt today.  States she had to stay over for work.  Reports no pain curently and than she just gets tired easily.     Currently in Pain?  No/denies                       [facility] Adult PT Treatment/Exercise - 06/17/18 0001      Ambulation/Gait   Gait Comments  heel to toe gait X 100 feet      Exercises   Exercises  Knee/Hip      Knee/Hip Exercises: Standing   Heel Raises  15 reps    Heel Raises Limitations  heel and toe raises incline slope    Lateral Step Up  Both;Hand Hold: 1;Step Height: 4";15 reps    Forward Step Up  Both;Hand Hold: 0;Step Height: 4";15  reps    Forward Step Up Limitations  slow control    Step Down  1 set;Hand Hold: 1;Step Height: 4";15 reps    Step Down Limitations  eccentric control    Functional Squat  15 reps    Functional Squat Limitations  front of chair for mechanics    Other Standing Knee Exercises  wall arch with heel raise      Knee/Hip Exercises: Seated   Sit to Sand  10 reps;without UE support          Balance Exercises - 06/17/18 1728      Balance Exercises: Standing   Tandem Stance  Eyes open;Foam/compliant surface;30 secs;1 rep   1 rep 30" each then UE flexion with 1# wand on foam 10 reps    SLS  5 reps;Eyes open;Solid surface   taping flexion, abduction, extension   Tandem Gait  1 rep   on line   Sidestepping  2 reps;Theraband    Marching Limitations  10x 5" alternating with intermittent  HHA          PT Short Term Goals - 05/28/18 1812      PT SHORT TERM GOAL #1   Title  Patient will be independent with HEP, updated PRN, to improve strength and activity tolerance.    Time  2    Period  Weeks    Status  New    Target Date  06/11/18      PT SHORT TERM GOAL #2   Title  Patient will be able to perform SLS on Bil LE's for 10 seconds to improve safety with gait and stair mobility as well as demonstrate improved balance for reduce fall risk.    Time  2    Period  Weeks    Status  New        PT Long Term Goals - 05/28/18 1813      PT LONG TERM GOAL #1   Title  Patient will ambulate at = or >1.2 m/s to demonstrate improve community access and safety with reduced fall risk during .     Time  4    Period  Weeks    Status  New    Target Date  06/25/18      PT LONG TERM GOAL #2   Title  Patient will improve limited muscle groups by 1/2 grade iwth MMT to demonstrate improvement in strength for improve endurance and activity tolerance.     Time  4    Period  Weeks    Status  New      PT LONG TERM GOAL #3   Title  Patient will score a 22/24 on DGI to indicate significant  improvement in balance while walking and performing dynamic activities as well as show a decreased risk of falls.    Time  4    Period  Weeks    Status  New      PT LONG TERM GOAL #4   Title  Patient will ascend/descend 1 flight of stairs with alternating step pattern and 1 handrail to be able to access her downstairs at home. She will also report feeling less fearful of falling with stair negotiation.     Time  4    Period  Weeks    Status  New            Plan - 06/17/18 1740    Clinical Impression Statement  continued with balance and functional strengthening exercises.  Worked with pateint on heel to toe gait and she ambulates flat footed with short, choppy steps.  PRogressed tandem to UE flexion lifts on foam and began tandem gait to further challenge pateint.  PT able to complete without issues, however self corrected LOB    Rehab Potential  Good    PT Frequency  2x / week    PT Duration  4 weeks    PT Treatment/Interventions  ADLs/Self Care Home Management;Gait training;Stair training;Neuromuscular re-education;Functional mobility training;Therapeutic activities;Patient/family education;Therapeutic exercise;Balance training;Manual techniques;Passive range of motion;Energy conservation    PT Next Visit Plan  Next session add retro gait. Continue balance training and hip strengthening. continue to work on heel to toe gait.     PT Home Exercise Plan  06/03/18: SLS and tandem stance       Patient will benefit from skilled therapeutic intervention in order to improve the following deficits and impairments:  Abnormal gait, Decreased balance, Difficulty walking, Decreased activity tolerance, Decreased endurance  Visit Diagnosis: Other abnormalities of gait and mobility  Muscle weakness (generalized)     Problem List Patient Active Problem List   Diagnosis Date Noted  . Abnormal EKG   . Intractable nausea and vomiting 04/18/2018  . Hypokalemia 04/18/2018  . Hypomagnesemia  04/18/2018  . HLD (hyperlipidemia) 04/18/2018  . Hyperlipidemia associated with type 2 diabetes mellitus (HCC) 03/02/2018  . CVA (cerebral vascular accident) (HCC) 02/24/2018  . Essential hypertension 02/24/2018  . Diabetes (HCC) 02/24/2018  . Acute lower UTI 02/24/2018  . Acute osteomyelitis of right foot (HCC) 04/22/2016  . Wound infection 03/15/2016   Lurena Nida, PTA/CLT [phone]  Lurena Nida 06/17/2018, 5:43 PM  [facility] [address] [place], [place], [address] Phone: [phone]   Fax:  [phone]  Name: [name] [name] MRN: [id number] Date of Birth: [birth year]

## 2018-06-18 DIAGNOSIS — Z794 Long term (current) use of insulin: Secondary | ICD-10-CM | POA: Diagnosis not present

## 2018-06-18 DIAGNOSIS — E1142 Type 2 diabetes mellitus with diabetic polyneuropathy: Secondary | ICD-10-CM | POA: Diagnosis not present

## 2018-06-18 DIAGNOSIS — Z8489 Family history of other specified conditions: Secondary | ICD-10-CM | POA: Diagnosis not present

## 2018-06-18 DIAGNOSIS — E538 Deficiency of other specified B group vitamins: Secondary | ICD-10-CM | POA: Diagnosis not present

## 2018-06-19 ENCOUNTER — Ambulatory Visit (HOSPITAL_COMMUNITY): Payer: BLUE CROSS/BLUE SHIELD

## 2018-06-19 ENCOUNTER — Encounter (HOSPITAL_COMMUNITY): Payer: Self-pay

## 2018-06-19 DIAGNOSIS — M6281 Muscle weakness (generalized): Secondary | ICD-10-CM

## 2018-06-19 DIAGNOSIS — R2689 Other abnormalities of gait and mobility: Secondary | ICD-10-CM | POA: Diagnosis not present

## 2018-06-19 NOTE — Therapy (Signed)
[place] [facility] [address] [place], [place], [address] Phone: [phone]   Fax:  [phone]  Physical Therapy Treatment  Patient Details  Name: [name] [name] MRN: [id number] Date of Birth: May 16, 1956 Referring Provider (PT): Merlyn Albert, MD   Encounter Date: 06/19/2018  PT End of Session - 06/19/18 1656    Visit Number  6    Number of Visits  9    Date for PT Re-Evaluation  06/25/18    Authorization Type  [employer] (90 visits per year, no auth required)    Authorization Time Period  05/28/18 - 06/25/18    Authorization - Visit Number  6    Authorization - Number of Visits  90    PT Start Time  1656   arrived at 1650, restroom break til 1656   PT Stop Time  1736    PT Time Calculation (min)  40 min    Equipment Utilized During Treatment  Gait belt    Activity Tolerance  Patient tolerated treatment well    Behavior During Therapy  WFL for tasks assessed/performed       Past Medical History:  Diagnosis Date  . Hypertension   . Microproteinuria   . Neuropathy   . Noncompliance   . Osteopenia   . Stroke [facility])    residual "balance issues"  . Type 2 diabetes mellitus (HCC)     Past Surgical History:  Procedure Laterality Date  . ABDOMINAL HYSTERECTOMY    . BACK SURGERY      There were no vitals filed for this visit.  Subjective Assessment - 06/19/18 1651    Subjective  Pt stated she is tired today, been a busy day wiht work and running errands following.      Pertinent History  [place] CVA on 02/24/18, hospitalized for virus on 04/19/18    Patient Stated Goals  I would like to move around normally and faster    Currently in Pain?  No/denies                       [facility] Adult PT Treatment/Exercise - 06/19/18 0001      Ambulation/Gait   Gait Comments  heel to toe gait X 226 feet      Exercises   Exercises  Knee/Hip      Knee/Hip Exercises: Standing   Heel Raises  20 reps    Heel Raises Limitations   heel and toe raises incline slope    Functional Squat  15 reps    Functional Squat Limitations  front of chair for mechanics    Stairs  1RT 1HR reciprocal pattern 7in height    Rocker Board  2 minutes    Rocker Board Limitations  Df/PF and lateral          Balance Exercises - 06/19/18 1718      Balance Exercises: Standing   Tandem Stance  Eyes open;Foam/compliant surface;30 secs;1 rep   2nd set with UE flexion 2# dowel rod   SLS  5 reps;Eyes open;Solid surface    Tandem Gait  2 reps    Retro Gait  2 reps    Sidestepping  2 reps;Theraband   RTB   Sit to Stand Time  10x          PT Short Term Goals - 05/28/18 1812      PT SHORT TERM GOAL #1   Title  Patient will be independent with HEP, updated PRN,  to improve strength and activity tolerance.    Time  2    Period  Weeks    Status  New    Target Date  06/11/18      PT SHORT TERM GOAL #2   Title  Patient will be able to perform SLS on Bil LE's for 10 seconds to improve safety with gait and stair mobility as well as demonstrate improved balance for reduce fall risk.    Time  2    Period  Weeks    Status  New        PT Long Term Goals - 05/28/18 1813      PT LONG TERM GOAL #1   Title  Patient will ambulate at = or >1.2 m/s to demonstrate improve community access and safety with reduced fall risk during .     Time  4    Period  Weeks    Status  New    Target Date  06/25/18      PT LONG TERM GOAL #2   Title  Patient will improve limited muscle groups by 1/2 grade iwth MMT to demonstrate improvement in strength for improve endurance and activity tolerance.     Time  4    Period  Weeks    Status  New      PT LONG TERM GOAL #3   Title  Patient will score a 22/24 on DGI to indicate significant improvement in balance while walking and performing dynamic activities as well as show a decreased risk of falls.    Time  4    Period  Weeks    Status  New      PT LONG TERM GOAL #4   Title  Patient will  ascend/descend 1 flight of stairs with alternating step pattern and 1 handrail to be able to access her downstairs at home. She will also report feeling less fearful of falling with stair negotiation.     Time  4    Period  Weeks    Status  New            Plan - 06/19/18 1745    Clinical Impression Statement  Continued with balance training and functional strengthening exercises.  Pt continues to ambulate flat footed with short, choppy steps, cueing to improve mechanics.  Added rockerboard to improve weight distribution and heel/toe sequence.  Pt continues to have difficulty with tandem stance and SLS activities, min A for safety and pt stepping outside NBOS to self regain LOB episodes with tandem gait.  Added retro gait for balance and gluteal strengthening.      Rehab Potential  Good    PT Frequency  2x / week    PT Duration  4 weeks    PT Treatment/Interventions  ADLs/Self Care Home Management;Gait training;Stair training;Neuromuscular re-education;Functional mobility training;Therapeutic activities;Patient/family education;Therapeutic exercise;Balance training;Manual techniques;Passive range of motion;Energy conservation    PT Next Visit Plan  Next session add SLS cone tapping and vector stance.  Progressed sidestep to balance beam.Continue balance training and hip strengthening. continue to work on heel to toe gait.     PT Home Exercise Plan  06/03/18: SLS and tandem stance       Patient will benefit from skilled therapeutic intervention in order to improve the following deficits and impairments:  Abnormal gait, Decreased balance, Difficulty walking, Decreased activity tolerance, Decreased endurance  Visit Diagnosis: Other abnormalities of gait and mobility  Muscle weakness (generalized)     Problem List Patient  Active Problem List   Diagnosis Date Noted  . Abnormal EKG   . Intractable nausea and vomiting 04/18/2018  . Hypokalemia 04/18/2018  . Hypomagnesemia 04/18/2018   . HLD (hyperlipidemia) 04/18/2018  . Hyperlipidemia associated with type 2 diabetes mellitus (HCC) 03/02/2018  . CVA (cerebral vascular accident) (HCC) 02/24/2018  . Essential hypertension 02/24/2018  . Diabetes (HCC) 02/24/2018  . Acute lower UTI 02/24/2018  . Acute osteomyelitis of right foot (HCC) 04/22/2016  . Wound infection 03/15/2016   Becky Sax, LPTA; CBIS [phone]  Juel Burrow 06/19/2018, 5:50 PM  [facility] [address] [place], [place], [address] Phone: [phone]   Fax:  [phone]  Name: [name] [name] MRN: [id number] Date of Birth: [birth year]

## 2018-06-24 ENCOUNTER — Encounter (HOSPITAL_COMMUNITY): Payer: Self-pay

## 2018-06-24 ENCOUNTER — Ambulatory Visit (HOSPITAL_COMMUNITY): Payer: BLUE CROSS/BLUE SHIELD

## 2018-06-24 DIAGNOSIS — R2689 Other abnormalities of gait and mobility: Secondary | ICD-10-CM

## 2018-06-24 DIAGNOSIS — M6281 Muscle weakness (generalized): Secondary | ICD-10-CM

## 2018-06-24 NOTE — Therapy (Signed)
[place] [facility] [facility] [address] [place], [place], [address] Phone: [phone]   Fax:  [phone]  Physical Therapy Treatment  Patient Details  Name: [name] StarrJanice P [name] MRN: [id number] Date of Birth: 02/14/1956 Referring Provider (PT): Merlyn AlbertLuking, William S, MD   Encounter Date: 06/24/2018  PT End of Session - 06/24/18 1652    Visit Number  7    Number of Visits  9    Date for PT Re-Evaluation  06/25/18    Authorization Type  BCBS (90 visits per year, no auth required)    Authorization Time Period  05/28/18 - 06/25/18    Authorization - Visit Number  7    Authorization - Number of Visits  60    PT Start Time  1652    PT Stop Time  1732    PT Time Calculation (min)  40 min    Equipment Utilized During Treatment  Gait belt    Activity Tolerance  Patient tolerated treatment well    Behavior During Therapy  WFL for tasks assessed/performed       Past Medical History:  Diagnosis Date  . Hypertension   . Microproteinuria   . Neuropathy   . Noncompliance   . Osteopenia   . Stroke [facility](HCC)    residual "balance issues"  . Type 2 diabetes mellitus (HCC)     Past Surgical History:  Procedure Laterality Date  . ABDOMINAL HYSTERECTOMY    . BACK SURGERY      There were no vitals filed for this visit.  Subjective Assessment - 06/24/18 1654    Subjective  Pt stated she had eye doctor apt earlier today, stated she has a little blurry vision today.  No reports of pain today.      Pertinent History  [place] CVA on 02/24/18, hospitalized for virus on 04/19/18    Patient Stated Goals  I would like to move around normally and faster    Currently in Pain?  No/denies                       [facility] - 06/24/18 0001      Exercises   Exercises  Knee/Hip      Knee/Hip Exercises: Standing   Functional Squat  15 reps    Functional Squat Limitations  front of chair for mechanics    Stairs  5RT reciprocal pattern 7in      SLS with Vectors  3x 5" BLE with intermittent HHA          Balance Exercises - 06/24/18 1730      Balance Exercises: Standing   Tandem Stance  Eyes open;Foam/compliant surface;30 secs;1 rep   UE flexion with 2# dowel rod   SLS  5 reps;Eyes open;Solid surface   max 5" BLE    Balance Beam  Tandem and sidestep 2RT    Retro Gait  2 reps    Sit to Stand Time  10x    Other Standing Exercises  4 cone tapping 5x each           PT Short Term Goals - 05/28/18 1812      PT SHORT TERM GOAL #1   Title  Patient will be independent with HEP, updated PRN, to improve strength and activity tolerance.    Time  2    Period  Weeks    Status  New    Target Date  06/11/18      PT  SHORT TERM GOAL #2   Title  Patient will be able to perform SLS on Bil LE's for 10 seconds to improve safety with gait and stair mobility as well as demonstrate improved balance for reduce fall risk.    Time  2    Period  Weeks    Status  New        PT Long Term Goals - 05/28/18 1813      PT LONG TERM GOAL #1   Title  Patient will ambulate at = or >1.2 m/s to demonstrate improve community access and safety with reduced fall risk during .     Time  4    Period  Weeks    Status  New    Target Date  06/25/18      PT LONG TERM GOAL #2   Title  Patient will improve limited muscle groups by 1/2 grade iwth MMT to demonstrate improvement in strength for improve endurance and activity tolerance.     Time  4    Period  Weeks    Status  New      PT LONG TERM GOAL #3   Title  Patient will score a 22/24 on DGI to indicate significant improvement in balance while walking and performing dynamic activities as well as show a decreased risk of falls.    Time  4    Period  Weeks    Status  New      PT LONG TERM GOAL #4   Title  Patient will ascend/descend 1 flight of stairs with alternating step pattern and 1 handrail to be able to access her downstairs at home. She will also report feeling less fearful of  falling with stair negotiation.     Time  4    Period  Weeks    Status  New            Plan - 06/24/18 1746    Clinical Impression Statement  Progressed balance training and functional strengthening exercises.  Began sidestep and tandem on balance beam as progression with dynamic surfaces wiht min A.  Min A and/or HHA required for LOB episodes.  Pt continues to have difficulty wtih SLS activities, added cone tapping and vector stance for SLS activities and hip stability to assist iwht balance.    Rehab Potential  Good    PT Frequency  2x / week    PT Duration  4 weeks    PT Treatment/Interventions  ADLs/Self Care Home Management;Gait training;Stair training;Neuromuscular re-education;Functional mobility training;Therapeutic activities;Patient/family education;Therapeutic exercise;Balance training;Manual techniques;Passive range of motion;Energy conservation    PT Next Visit Plan  Reassess next session.  Continue balance training and hip strengtheing and heel to toe gait.      PT Home Exercise Plan  06/03/18: SLS and tandem stance       Patient will benefit from skilled therapeutic intervention in order to improve the following deficits and impairments:  Abnormal gait, Decreased balance, Difficulty walking, Decreased activity tolerance, Decreased endurance  Visit Diagnosis: Other abnormalities of gait and mobility  Muscle weakness (generalized)     Problem List Patient Active Problem List   Diagnosis Date Noted  . Abnormal EKG   . Intractable nausea and vomiting 04/18/2018  . Hypokalemia 04/18/2018  . Hypomagnesemia 04/18/2018  . HLD (hyperlipidemia) 04/18/2018  . Hyperlipidemia associated with type 2 diabetes mellitus (HCC) 03/02/2018  . CVA (cerebral vascular accident) (HCC) 02/24/2018  . Essential hypertension 02/24/2018  . Diabetes (HCC) 02/24/2018  . Acute  lower UTI 02/24/2018  . Acute osteomyelitis of right foot (HCC) 04/22/2016  . Wound infection 03/15/2016    Becky Sax, LPTA; CBIS [phone]  Juel Burrow 06/24/2018, 6:00 PM  [facility] [address] [place], [place], [address] Phone: [phone]   Fax:  [phone]  Name: HARTLEIGH EDMONSTON MRN: [id number] Date of Birth: 1956/08/08

## 2018-06-26 ENCOUNTER — Ambulatory Visit (HOSPITAL_COMMUNITY): Payer: BLUE CROSS/BLUE SHIELD

## 2018-06-26 DIAGNOSIS — R2689 Other abnormalities of gait and mobility: Secondary | ICD-10-CM | POA: Diagnosis not present

## 2018-06-26 DIAGNOSIS — M6281 Muscle weakness (generalized): Secondary | ICD-10-CM

## 2018-06-26 NOTE — Therapy (Signed)
[place], [place], [address] Phone: [phone]   Fax:  [phone]  Physical Therapy Treatment/Discharge Summary  Patient Details  Name: [name] [name] MRN: [id number] Date of Birth: Jul 12, 1956 Referring Provider (PT): Mikey Kirschner, MD   Encounter Date: 06/26/2018   PHYSICAL THERAPY DISCHARGE SUMMARY  Visits from Start of Care: 8  Current functional level related to goals / functional outcomes: Re-assessment performed today and patient has made limited progress towards her goals. She reports inconsistent participation in HEP and recognized that this will limit her progress. She does report feeling safer and more confident with ascending/descending stairs but does prefer to have a railing for some support. She made small improvements in some of her muscle groups that were limited at evaluation however no significant change occurred for her gait velocity or balance on the DGI. She also remains limited with SLS balance testing. [name] [name] has reported that she is mobilizing at or similar to her baseline prior to having her stroke. She verbalized her understanding of the importance of regular exercise for health and to maintain her current functional level. She has been given a handout to the local [employer] for a 2 week free trial and exercise to address back tightness that can be easily performed at work. She agrees that she is ready to be discharged and participate in regular exercise independently.   Remaining deficits: See below details   Education / Equipment: Patient educated on lack of progress towards goals. She was provided stretches for her back pain related to work while typing at her computer. She was also provided a handout for local massage therapists and to the local gym for a free 2 week trial.  Plan: Patient agrees to discharge.  Patient goals were not met. Patient is being discharged due to meeting the  stated rehab goals.  ?????       PT End of Session - 06/26/18 1726    Visit Number  8    Number of Visits  9    Date for PT Re-Evaluation  06/25/18    Authorization Type  BCBS (90 visits per year, no auth required)    Authorization Time Period  05/28/18 - 06/25/18    Authorization - Visit Number  8    Authorization - Number of Visits  60    PT Start Time  1700    PT Stop Time  1733    PT Time Calculation (min)  33 min    Equipment Utilized During Treatment  Gait belt    Activity Tolerance  Patient tolerated treatment well    Behavior During Therapy  WFL for tasks assessed/performed       Past Medical History:  Diagnosis Date  . Hypertension   . Microproteinuria   . Neuropathy   . Noncompliance   . Osteopenia   . Stroke [facility])    residual "balance issues"  . Type 2 diabetes mellitus ([place])     Past Surgical History:  Procedure Laterality Date  . ABDOMINAL HYSTERECTOMY    . BACK SURGERY      There were no vitals filed for this visit.      [facility] PT Assessment - 06/26/18 0001      Assessment   Medical Diagnosis  Weakness and Balance deficits    Referring Provider (PT)  Mikey Kirschner, MD    Onset Date/Surgical Date  02/24/18    Hand Dominance  Left    Next  MD Visit  06/04/18      Precautions   Precautions  None      Restrictions   Weight Bearing Restrictions  No      Home Environment   Living Environment  Private residence    Living Arrangements  Spouse/significant other    Available Help at Discharge  Family    Type of [place] to enter    Entrance Stairs-Number of Steps  1   7 in the back   [place]  One level;Laundry or work area in Production assistant, radio - 2 wheels;Shower seat    Additional Comments  replacing stair railin gin basement      Prior Function   Level of Independence  Independent    Vocation  Full time employment    Leisure  enjoys dancing, playing ball,  bowling, tennis, banmitten, garden      Cognition   Overall Cognitive Status  Within Functional Limits for tasks assessed      Observation/Other Assessments   Focus on Therapeutic Outcomes (FOTO)   --      Functional Tests   Functional tests  Single leg stance      Single Leg Stance   Comments  Rt LE = 3 sec, Lt LE = 4 sec      Strength   Right Hip Flexion  5/5    Right Hip Extension  4+/5    Right Hip ABduction  4+/5    Left Hip Flexion  4/5    Left Hip Extension  4+/5    Left Hip ABduction  5/5    Right Knee Flexion  4+/5    Right Knee Extension  5/5    Left Knee Flexion  4+/5    Left Knee Extension  5/5    Right Ankle Dorsiflexion  4+/5    Left Ankle Dorsiflexion  4+/5      Transfers   Five time sit to stand comments   --      Ambulation/Gait   Ambulation/Gait  Yes    Ambulation/Gait Assistance  7: Independent    Ambulation Distance (Feet)  426 Feet   2MWT   Assistive device  None    Gait Pattern  Step-through pattern;Decreased stride length;Wide base of support    Gait velocity  1.0 m/s    Stairs  Yes    Stairs Assistance  6: Modified independent (Device/Increase time)    Stair Management Technique  One rail Right;Alternating pattern;Forwards    Number of Stairs  12    Height of Stairs  6    Gait Comments  Patient is fearful of stair ambulation as she feels unsteady      Standardized Balance Assessment   Standardized Balance Assessment  Dynamic Gait Index      Dynamic Gait Index   Level Surface  Normal    Change in Gait Speed  Normal    Gait with Horizontal Head Turns  Mild Impairment    Gait with Vertical Head Turns  Mild Impairment    Gait and Pivot Turn  Normal    Step Over Obstacle  Mild Impairment    Step Around Obstacles  Normal    Steps  Mild Impairment    Total Score  20    DGI comment:  patient is unsteady with gait challenges and score of 19 is indicitive of fall risk  [place] Adult PT Treatment/Exercise - 06/26/18 0001      Exercises    Exercises  Neck      Neck Exercises: Stretches   Corner Stretch  5 reps;10 seconds    Other Neck Stretches  Seated upper thoracic extension stretch; 5 reps 10 seconds       PT Education - 06/26/18 1753    Education Details  Patient educated on lack of progress towards goals. She was provided stretches for her back pain related to work while typing at her computer. She was also provided a handout for local massage therapists and to the local gym for a free 2 week trial.     Person(s) Educated  Patient    Methods  Explanation;Handout;Demonstration    Comprehension  Verbalized understanding;Returned demonstration       PT Short Term Goals - 06/26/18 1700      PT SHORT TERM GOAL #1   Title  Patient will be independent with HEP, updated PRN, to improve strength and activity tolerance.    Baseline  "not like I should"    Time  2    Period  Weeks    Status  Partially Met      PT SHORT TERM GOAL #2   Title  Patient will be able to perform SLS on Bil LE's for 10 seconds to improve safety with gait and stair mobility as well as demonstrate improved balance for reduce fall risk.    Time  2    Period  Weeks    Status  Not Met        PT Long Term Goals - 06/26/18 1700      PT LONG TERM GOAL #1   Title  Patient will ambulate at = or >1.2 m/s to demonstrate improve community access and safety with reduced fall risk during 2MWT.     Time  4    Period  Weeks    Status  Not Met      PT LONG TERM GOAL #2   Title  Patient will improve limited muscle groups by 1/2 grade iwth MMT to demonstrate improvement in strength for improve endurance and activity tolerance.     Time  4    Period  Weeks    Status  Partially Met      PT LONG TERM GOAL #3   Title  Patient will score a 22/24 on DGI to indicate significant improvement in balance while walking and performing dynamic activities as well as show a decreased risk of falls.    Time  4    Period  Weeks    Status  Not Met      PT LONG  TERM GOAL #4   Title  Patient will ascend/descend 1 flight of stairs with alternating step pattern and 1 handrail to be able to access her downstairs at home. She will also report feeling less fearful of falling with stair negotiation.     Time  4    Period  Weeks    Status  Achieved        Plan - 06/26/18 1700    Clinical Impression Statement  Re-assessment performed today and patient has made limited progress towards her goals. She reports inconsistent participation in HEP and recognized that this will limit her progress. She does report feeling safer and more confident with ascending/descending stairs but does prefer to have a railing for some support. She made small improvements in some of her muscle groups that were  limited at evaluation however no significant change occurred for her gait velocity or balance on the DGI. She also remains limited with SLS balance testing. [name] [name] has reported that she is mobilizing at or similar to her baseline prior to having her stroke. She verbalized her understanding of the importance of regular exercise for health and to maintain her current functional level. She has been given a handout to the local [employer] for a 2 week free trial and exercise to address back tightness that can be easily performed at work. She agrees that she is ready to be discharged and participate in regular exercise independently.    Rehab Potential  Good    PT Frequency  2x / week    PT Duration  4 weeks    PT Treatment/Interventions  ADLs/Self Care Home Management;Gait training;Stair training;Neuromuscular re-education;Functional mobility training;Therapeutic activities;Patient/family education;Therapeutic exercise;Balance training;Manual techniques;Passive range of motion;Energy conservation    PT Next Visit Plan  Discharge    PT Home Exercise Plan  06/03/18: SLS and tandem stance    Consulted and Agree with Plan of Care  Patient       Patient will benefit from skilled therapeutic  intervention in order to improve the following deficits and impairments:  Abnormal gait, Decreased balance, Difficulty walking, Decreased activity tolerance, Decreased endurance  Visit Diagnosis: Other abnormalities of gait and mobility  Muscle weakness (generalized)     Problem List Patient Active Problem List   Diagnosis Date Noted  . Abnormal EKG   . Intractable nausea and vomiting 04/18/2018  . Hypokalemia 04/18/2018  . Hypomagnesemia 04/18/2018  . HLD (hyperlipidemia) 04/18/2018  . Hyperlipidemia associated with type 2 diabetes mellitus ([place]) 03/02/2018  . CVA (cerebral vascular accident) ([place]) 02/24/2018  . Essential hypertension 02/24/2018  . Diabetes ([place]) 02/24/2018  . Acute lower UTI 02/24/2018  . Acute osteomyelitis of right foot ([place]) 04/22/2016  . Wound infection 03/15/2016    Kipp Brood, PT, DPT Physical Therapist with [facility]  06/26/2018 5:56 PM    [place] [address] [place], [place], [address] Phone: [phone]   Fax:  [phone]  Name: SAMYAH BILBO MRN: [id number] Date of Birth: Mar 12, 1956

## 2018-07-01 DIAGNOSIS — E1140 Type 2 diabetes mellitus with diabetic neuropathy, unspecified: Secondary | ICD-10-CM | POA: Diagnosis not present

## 2018-07-01 DIAGNOSIS — Z008 Encounter for other general examination: Secondary | ICD-10-CM | POA: Diagnosis not present

## 2018-07-01 DIAGNOSIS — Z719 Counseling, unspecified: Secondary | ICD-10-CM | POA: Diagnosis not present

## 2018-07-01 DIAGNOSIS — E663 Overweight: Secondary | ICD-10-CM | POA: Diagnosis not present

## 2018-09-03 ENCOUNTER — Ambulatory Visit (INDEPENDENT_AMBULATORY_CARE_PROVIDER_SITE_OTHER): Payer: BLUE CROSS/BLUE SHIELD | Admitting: Family Medicine

## 2018-09-03 ENCOUNTER — Encounter: Payer: Self-pay | Admitting: Family Medicine

## 2018-09-03 VITALS — BP 120/68 | Ht 62.0 in | Wt 154.0 lb

## 2018-09-03 DIAGNOSIS — Z23 Encounter for immunization: Secondary | ICD-10-CM | POA: Diagnosis not present

## 2018-09-03 DIAGNOSIS — E785 Hyperlipidemia, unspecified: Secondary | ICD-10-CM

## 2018-09-03 DIAGNOSIS — E1169 Type 2 diabetes mellitus with other specified complication: Secondary | ICD-10-CM

## 2018-09-03 DIAGNOSIS — Z79899 Other long term (current) drug therapy: Secondary | ICD-10-CM | POA: Diagnosis not present

## 2018-09-03 DIAGNOSIS — I6350 Cerebral infarction due to unspecified occlusion or stenosis of unspecified cerebral artery: Secondary | ICD-10-CM

## 2018-09-03 DIAGNOSIS — I10 Essential (primary) hypertension: Secondary | ICD-10-CM

## 2018-09-03 DIAGNOSIS — I639 Cerebral infarction, unspecified: Secondary | ICD-10-CM

## 2018-09-03 MED ORDER — TRIAMTERENE-HCTZ 37.5-25 MG PO CAPS: 1.0000 | ORAL_CAPSULE | Freq: Every day | ORAL | 5 refills | Status: DC

## 2018-09-03 NOTE — Progress Notes (Signed)
   Subjective:    Patient ID: [name] [name], female    DOB: [birth year], 63 y.o.   MRN: [id number] Patient arrives office with numerous concerns HPI Patient is here today to follow up on her chronic health issues.  She has a history of    Diabetes and takes lantus 52 units Qhs, Metformin 500 mg Qid, this is managed by Dr. Sharl Ma  Hypertension takes Triamterene w HCTZ 37.5-25 mg one per day.  Hyperlipidemia takes Lipitor 10 mg once per day. Blood pressure medicine and blood pressure levels reviewed today with patient. Compliant with blood pressure medicine. States does not miss a dose. No obvious side effects. Blood pressure generally good when checked elsewhere. Watching salt intake.   Patient continues to take lipid medication regularly. No obvious side effects from it. Generally does not miss a dose. Prior blood work results are reviewed with patient. Patient continues to work on fat intake in diet  Pt had a stroke, has finished phhys theray. Trying to stay active   No further tia symtoms    Review of Systems No headache, no major weight loss or weight gain, no chest pain no back pain abdominal pain no change in bowel habits complete ROS otherwise negative     Objective:   Physical Exam  Alert and oriented, vitals reviewed and stable, NAD ENT-TM's and ext canals WNL bilat via otoscopic exam Soft palate, tonsils and post pharynx WNL via oropharyngeal exam Neck-symmetric, no masses; thyroid nonpalpable and nontender Pulmonary-no tachypnea or accessory muscle use; Clear without wheezes via auscultation Card--no abnrml murmurs, rhythm reg and rate WNL Carotid pulses symmetric, without bruits       Assessment & Plan:  Impression 1 hypertension.  Good control discussed maintain same meds  2.  Hyperlipidemia.  Prior blood work discussed.  Medications refilled.  Appropriate blood work ordered  3.  Vaccine discussion held we will press on with Tdap  4.  Status post stroke.  No  new symptoms.  Has graduated from physical therapy.  Encouraged to do exercises  Follow-up in 6 months meds refilled diet exercise discussed blood work ordered

## 2018-09-15 NOTE — Progress Notes (Signed)
NEUROLOGY FOLLOW UP OFFICE NOTE  [name] [name] [id number]  HISTORY OF PRESENT ILLNESS: [name] [name] is a 63 year old left-handed woman with hypertension and type 2 diabetes with neuropathy who follows up for stroke.  She is accompanied by her husband who supplements history.  UPDATE: Current medications: Aspirin 81 mg daily, atorvastatin 10 mg daily, insulin, metformin, Dyazide  No residual symptoms except when she gets tired, she notes some mild heaviness and dropping things with her left hand/arm.  She says she tries to walk.  She has changed her diet but has not been strict.  She is trying to drink more water.     Last visit, a cardiac event monitor was ordered but nobody contacted her.  She is undergone physical therapy.  A lipid panel was recently ordered by her PCP.  HISTORY: She was admitted to [facility] from 02/24/18 to 02/25/18 for double vision, right sided facial numbness and tingling, left-sided weakness and unsteady gait.  CT of head showed no acute findings.  MRI of brain showed small right dorsal medial pontine infarct.  She did not receive tPA due to late presentation.  MRA of head demonstrated mild right and moderate left PCA stenosis; PICA, superior cerebellar arteries, basilar artery and vertebral arteries patent.  Carotid doppler revealed no hemodynamically significant ICA stenosis.  TTE showed EF 60-65% with no cardiac source of emboli.  Labs included LDL 78, Hgb A1c 10.1 and negative HIV.  She was discharged on ASA 325mg  daily and atorvastatin 10mg  daily.  She was also treated for UTI.  Since hospitalization, she has overall improved.  Diplopia has resolved.  However, she had a setback after developing gastroenteritis, for which she was treated in the hospital on 04/18/18.  CT of head showed no acute changes.  She was found to be dehydrated with low potassium and magnesium.  She now feels more fatigued again.  She has baseline unsteady gait due to diabetic  neuropathy but feels a little more off-balance.  PAST MEDICAL HISTORY: Past Medical History:  Diagnosis Date  . Hypertension   . Microproteinuria   . Neuropathy   . Noncompliance   . Osteopenia   . Stroke [facility](HCC)    residual "balance issues"  . Type 2 diabetes mellitus (HCC)     MEDICATIONS: Current Outpatient Medications on File Prior to Visit  Medication Sig Dispense Refill  . atorvastatin (LIPITOR) 10 MG tablet Take 1 tablet (10 mg total) by mouth daily at 6 PM. 30 tablet 3  . Cyanocobalamin (B-12 PO) Take 2 tablets by mouth daily.     . Insulin Glargine (LANTUS SOLOSTAR) 100 UNIT/ML Solostar Pen Inject 52 Units into the skin at bedtime.     . metFORMIN (GLUCOPHAGE-XR) 500 MG 24 hr tablet Take 1 tablet by mouth 4 (four) times daily.  2  . Multiple Vitamins-Minerals (ONE-A-DAY WOMENS VITACRAVES) CHEW Chew 1 tablet by mouth daily.    . ondansetron (ZOFRAN ODT) 8 MG disintegrating tablet Take 1 tablet (8 mg total) by mouth every 8 (eight) hours as needed for nausea or vomiting. 20 tablet 0  . phenol (CHLORASEPTIC) 1.4 % LIQD Use as directed 1-2 sprays in the mouth or throat as needed for throat irritation / pain. (Patient not taking: Reported on 06/04/2018)    . traMADol (ULTRAM) 50 MG tablet Take 1 tablet (50 mg total) by mouth every 8 (eight) hours as needed. (Patient not taking: Reported on 06/04/2018) 36 tablet 0  . triamterene-hydrochlorothiazide (DYAZIDE) 37.5-25 MG capsule Take  1 each (1 capsule total) by mouth daily. 30 capsule 5   No current facility-administered medications on file prior to visit.     ALLERGIES: Allergies  Allergen Reactions  . Ace Inhibitors Cough  . Glipizide   . Meclizine Hcl     FAMILY HISTORY: Family History  Problem Relation Age of Onset  . Diabetes Mother   . Breast cancer Mother   . Diabetes Father   . Deep vein thrombosis Sister     SOCIAL HISTORY: Social History   Socioeconomic History  . Marital status: Married    Spouse name:  [name] [name]  . Number of children: 2  . Years of education: Not on file  . Highest education level: Some college, no degree  Occupational History    Employer: [employer]  Social Needs  . Financial resource strain: Not on file  . Food insecurity:    Worry: Not on file    Inability: Not on file  . Transportation needs:    Medical: Not on file    Non-medical: Not on file  Tobacco Use  . Smoking status: Never Smoker  . Smokeless tobacco: Never Used  Substance and Sexual Activity  . Alcohol use: No  . Drug use: No  . Sexual activity: Not on file  Lifestyle  . Physical activity:    Days per week: Not on file    Minutes per session: Not on file  . Stress: Not on file  Relationships  . Social connections:    Talks on phone: Not on file    Gets together: Not on file    Attends religious service: Not on file    Active member of club or organization: Not on file    Attends meetings of clubs or organizations: Not on file    Relationship status: Not on file  . Intimate partner violence:    Fear of current or ex partner: Not on file    Emotionally abused: Not on file    Physically abused: Not on file    Forced sexual activity: Not on file  Other Topics Concern  . Not on file  Social History Narrative   Patient is left-handed. She lives with her husband ina one level home with a basement. She occasionally drinks coffee and has 2-3 diest sodas a day. She does not exercise.    REVIEW OF SYSTEMS: Constitutional: No fevers, chills, or sweats, no generalized fatigue, change in appetite Eyes: No visual changes, double vision, eye pain Ear, nose and throat: No hearing loss, ear pain, nasal congestion, sore throat Cardiovascular: No chest pain, palpitations Respiratory:  No shortness of breath at rest or with exertion, wheezes GastrointestinaI: No nausea, vomiting, diarrhea, abdominal pain, fecal incontinence Genitourinary:  No dysuria, urinary retention or frequency Musculoskeletal:  No neck  pain, back pain Integumentary: No rash, pruritus, skin lesions Neurological: as above Psychiatric: No depression, insomnia, anxiety Endocrine: No palpitations, fatigue, diaphoresis, mood swings, change in appetite, change in weight, increased thirst Hematologic/Lymphatic:  No purpura, petechiae. Allergic/Immunologic: no itchy/runny eyes, nasal congestion, recent allergic reactions, rashes  PHYSICAL EXAM: Blood pressure 108/62, pulse 66, height 5\' 2"  (1.575 m), weight 153 lb (69.4 kg), SpO2 98 %. General: No acute distress.  Patient appears well-groomed.   Head:  Normocephalic/atraumatic Eyes:  Fundi examined but not visualized Neck: supple, no paraspinal tenderness, full range of motion Heart:  Regular rate and rhythm Lungs:  Clear to auscultation bilaterally Back: No paraspinal tenderness Neurological Exam: alert and oriented to person, place,  and time. Attention span and concentration intact, recent and remote memory intact, fund of knowledge intact.  Speech fluent and not dysarthric, language intact.  CN II-XII intact. Bulk and tone normal, muscle strength 5/5 throughout.  Sensation to vibration sensation reduced in feet.  Deep tendon reflexes 2+ throughout, toes downgoing.  Finger to nose and heel to shin testing intact.  Gait normal, Romberg negative.  IMPRESSION: 1.  Right dorsal medial pontine infarct, likely secondary to small vessel disease but cannot rule out cardiac source 2.  Hypertension 3.  Type 2 diabetes mellitus with polyneuropathy  PLAN: 1.  Aspirin 81 mg daily for secondary stroke prevention 2.  Atorvastatin (LDL goal less than 70) 3.  Blood pressure and glycemic control 4.  Mediterranean diet 5.  Will check 24 hour Holter monitor 6.  Follow up in 6 months.  20 minutes spent face to face with patient, over 50% spent discussing management.  Shon Millet, DO  CC: Merlyn Albert, MD

## 2018-09-16 ENCOUNTER — Encounter: Payer: Self-pay | Admitting: Neurology

## 2018-09-16 ENCOUNTER — Ambulatory Visit (INDEPENDENT_AMBULATORY_CARE_PROVIDER_SITE_OTHER): Payer: BLUE CROSS/BLUE SHIELD | Admitting: Neurology

## 2018-09-16 VITALS — BP 108/62 | HR 66 | Ht 62.0 in | Wt 153.0 lb

## 2018-09-16 DIAGNOSIS — I10 Essential (primary) hypertension: Secondary | ICD-10-CM

## 2018-09-16 DIAGNOSIS — Z794 Long term (current) use of insulin: Secondary | ICD-10-CM | POA: Diagnosis not present

## 2018-09-16 DIAGNOSIS — E1142 Type 2 diabetes mellitus with diabetic polyneuropathy: Secondary | ICD-10-CM | POA: Diagnosis not present

## 2018-09-16 DIAGNOSIS — I6350 Cerebral infarction due to unspecified occlusion or stenosis of unspecified cerebral artery: Secondary | ICD-10-CM

## 2018-09-16 NOTE — Patient Instructions (Addendum)
1.  We will check a 24 hour Holter monitor 2.  Continue aspirin 81mg  daily 3.  Continue atorvastatin 10mg  daily 4.  Continue blood pressure and diabetes control 5.  Try to increase water intake and improve diet 6.  Follow up in 6 months.  We have sent a referral for the 24 hour Holter monitor to [facility] in [place]. They will contact you directly to schedule. If you do not hear from them within 5 days, you may contact them at [phone].

## 2018-10-14 DIAGNOSIS — E538 Deficiency of other specified B group vitamins: Secondary | ICD-10-CM | POA: Diagnosis not present

## 2018-10-14 DIAGNOSIS — E1142 Type 2 diabetes mellitus with diabetic polyneuropathy: Secondary | ICD-10-CM | POA: Diagnosis not present

## 2018-10-14 DIAGNOSIS — Z794 Long term (current) use of insulin: Secondary | ICD-10-CM | POA: Diagnosis not present

## 2018-10-14 DIAGNOSIS — Z8489 Family history of other specified conditions: Secondary | ICD-10-CM | POA: Diagnosis not present

## 2018-10-21 DIAGNOSIS — E1142 Type 2 diabetes mellitus with diabetic polyneuropathy: Secondary | ICD-10-CM | POA: Diagnosis not present

## 2018-11-21 ENCOUNTER — Other Ambulatory Visit: Payer: Self-pay | Admitting: *Deleted

## 2018-11-21 MED ORDER — ATORVASTATIN CALCIUM 10 MG PO TABS: 10.0000 mg | ORAL_TABLET | Freq: Every day | ORAL | 0 refills | Status: DC

## 2018-12-22 ENCOUNTER — Other Ambulatory Visit: Payer: Self-pay | Admitting: Family Medicine

## 2019-01-07 DIAGNOSIS — E113311 Type 2 diabetes mellitus with moderate nonproliferative diabetic retinopathy with macular edema, right eye: Secondary | ICD-10-CM | POA: Diagnosis not present

## 2019-01-07 DIAGNOSIS — H35033 Hypertensive retinopathy, bilateral: Secondary | ICD-10-CM | POA: Diagnosis not present

## 2019-01-07 DIAGNOSIS — H35432 Paving stone degeneration of retina, left eye: Secondary | ICD-10-CM | POA: Diagnosis not present

## 2019-01-07 DIAGNOSIS — E113392 Type 2 diabetes mellitus with moderate nonproliferative diabetic retinopathy without macular edema, left eye: Secondary | ICD-10-CM | POA: Diagnosis not present

## 2019-01-16 DIAGNOSIS — Z794 Long term (current) use of insulin: Secondary | ICD-10-CM | POA: Diagnosis not present

## 2019-01-16 DIAGNOSIS — E1142 Type 2 diabetes mellitus with diabetic polyneuropathy: Secondary | ICD-10-CM | POA: Diagnosis not present

## 2019-01-16 DIAGNOSIS — Z8489 Family history of other specified conditions: Secondary | ICD-10-CM | POA: Diagnosis not present

## 2019-01-16 DIAGNOSIS — Z8673 Personal history of transient ischemic attack (TIA), and cerebral infarction without residual deficits: Secondary | ICD-10-CM | POA: Diagnosis not present

## 2019-02-04 ENCOUNTER — Other Ambulatory Visit: Payer: Self-pay | Admitting: Family Medicine

## 2019-03-03 ENCOUNTER — Other Ambulatory Visit: Payer: Self-pay

## 2019-03-05 ENCOUNTER — Other Ambulatory Visit: Payer: Self-pay

## 2019-03-05 ENCOUNTER — Ambulatory Visit (INDEPENDENT_AMBULATORY_CARE_PROVIDER_SITE_OTHER): Payer: BC Managed Care – PPO | Admitting: Family Medicine

## 2019-03-05 DIAGNOSIS — E7801 Familial hypercholesterolemia: Secondary | ICD-10-CM

## 2019-03-05 DIAGNOSIS — Z1231 Encounter for screening mammogram for malignant neoplasm of breast: Secondary | ICD-10-CM | POA: Diagnosis not present

## 2019-03-05 DIAGNOSIS — I10 Essential (primary) hypertension: Secondary | ICD-10-CM

## 2019-03-05 DIAGNOSIS — I6350 Cerebral infarction due to unspecified occlusion or stenosis of unspecified cerebral artery: Secondary | ICD-10-CM | POA: Diagnosis not present

## 2019-03-05 DIAGNOSIS — I639 Cerebral infarction, unspecified: Secondary | ICD-10-CM | POA: Diagnosis not present

## 2019-03-05 MED ORDER — TRIAMTERENE-HCTZ 37.5-25 MG PO CAPS: 1.0000 | ORAL_CAPSULE | Freq: Every day | ORAL | 5 refills | Status: DC

## 2019-03-05 MED ORDER — ATORVASTATIN CALCIUM 10 MG PO TABS: ORAL_TABLET | ORAL | 5 refills | Status: DC

## 2019-03-05 NOTE — Progress Notes (Signed)
   Subjective:    Patient ID: [name] [name], female    DOB: [birth year], 63 y.o.   MRN: [id number]  Hyperlipidemia This is a chronic problem. Compliance problems include adherence to diet (takes meds every day, could do better with diet, walks alot at work).    Pt checked bp this morning. 118/78. Pulse 62.   Sees a diabetic specialist.   Pt states she is doing well and no concerns today.   Virtual Visit via Video Note  I connected with [name] [name] on 03/05/19 at  3:00 PM EDT by a video enabled telemedicine application and verified that I am speaking with the correct person using two identifiers.  Location: Patient: home Provider: office   I discussed the limitations of evaluation and management by telemedicine and the availability of in person appointments. The patient expressed understanding and agreed to proceed.  History of Present Illness:    Observations/Objective:   Assessment and Plan:   Follow Up Instructions:    I discussed the assessment and treatment plan with the patient. The patient was provided an opportunity to ask questions and all were answered. The patient agreed with the plan and demonstrated an understanding of the instructions.   The patient was advised to call back or seek an in-person evaluation if the symptoms worsen or if the condition fails to improve as anticipated.  I provided 25 minutes of non-face-to-face time during this encounter.   Blood pressure medicine and blood pressure levels reviewed today with patient. Compliant with blood pressure medicine. States does not miss a dose. No obvious side effects. Blood pressure generally good when checked elsewhere. Watching salt intake.   Patient continues to take lipid medication regularly. No obvious side effects from it. Generally does not miss a dose. Prior blood work results are reviewed with patient. Patient continues to work on fat intake in diet  No further new stroke symptoms.  Patient  still feels a little uneasiness and unsteadiness at times.  But overall quite stable.  In fact back to work and handling things okay    Review of Systems No headache, no major weight loss or weight gain, no chest pain no back pain abdominal pain no change in bowel habits complete ROS otherwise negative     Objective:   Physical Exam   Virtual     Assessment & Plan:  Impression 1 hyperlipidemia prior blood work reviewed discussed to maintain same meds rationale for meds discussed  2.  Hypertension.  Apparent good control discussed to maintain same meds  3.  Type 2 diabetes followed by specialist  4.  Status post pontine stroke.  Clinically stable importance of exercise and risk factor control discussed  General questions answered medications refilled diet exercise discussed follow-up in 6 months

## 2019-03-07 ENCOUNTER — Encounter: Payer: Self-pay | Admitting: Family Medicine

## 2019-03-09 ENCOUNTER — Emergency Department (HOSPITAL_COMMUNITY): Payer: BC Managed Care – PPO

## 2019-03-09 ENCOUNTER — Encounter (HOSPITAL_COMMUNITY): Payer: Self-pay | Admitting: Emergency Medicine

## 2019-03-09 ENCOUNTER — Emergency Department (HOSPITAL_COMMUNITY)
Admission: EM | Admit: 2019-03-09 | Discharge: 2019-03-09 | Disposition: A | Discharge status: Home / Self Care | Payer: BC Managed Care – PPO | Attending: Emergency Medicine | Admitting: Emergency Medicine

## 2019-03-09 ENCOUNTER — Telehealth: Payer: Self-pay | Admitting: *Deleted

## 2019-03-09 ENCOUNTER — Other Ambulatory Visit: Payer: Self-pay

## 2019-03-09 DIAGNOSIS — R42 Dizziness and giddiness: Secondary | ICD-10-CM | POA: Diagnosis not present

## 2019-03-09 DIAGNOSIS — E119 Type 2 diabetes mellitus without complications: Secondary | ICD-10-CM | POA: Insufficient documentation

## 2019-03-09 DIAGNOSIS — Z794 Long term (current) use of insulin: Secondary | ICD-10-CM | POA: Insufficient documentation

## 2019-03-09 DIAGNOSIS — I10 Essential (primary) hypertension: Secondary | ICD-10-CM | POA: Diagnosis not present

## 2019-03-09 LAB — DIFFERENTIAL
Abs Immature Granulocytes: 0.03 10*3/uL (ref 0.00–0.07)
Basophils Absolute: 0.1 10*3/uL (ref 0.0–0.1)
Basophils Relative: 1 %
Eosinophils Absolute: 0.2 10*3/uL (ref 0.0–0.5)
Eosinophils Relative: 3 %
Immature Granulocytes: 0 %
Lymphocytes Relative: 22 %
Lymphs Abs: 1.9 10*3/uL (ref 0.7–4.0)
Monocytes Absolute: 0.8 10*3/uL (ref 0.1–1.0)
Monocytes Relative: 9 %
Neutro Abs: 5.9 10*3/uL (ref 1.7–7.7)
Neutrophils Relative %: 65 %

## 2019-03-09 LAB — COMPREHENSIVE METABOLIC PANEL
ALT: 24 U/L (ref 0–44)
AST: 25 U/L (ref 15–41)
Albumin: 3.6 g/dL (ref 3.5–5.0)
Alkaline Phosphatase: 68 U/L (ref 38–126)
Anion gap: 7 (ref 5–15)
BUN: 19 mg/dL (ref 8–23)
CO2: 25 mmol/L (ref 22–32)
Calcium: 9.4 mg/dL (ref 8.9–10.3)
Chloride: 104 mmol/L (ref 98–111)
Creatinine, Ser: 1.06 mg/dL — ABNORMAL HIGH (ref 0.44–1.00)
GFR calc Af Amer: >60 mL/min (ref >60)
GFR calc non Af Amer: 55 mL/min — ABNORMAL LOW (ref >60)
Glucose, Bld: 165 mg/dL — ABNORMAL HIGH (ref 70–99)
Potassium: 4.2 mmol/L (ref 3.5–5.1)
Sodium: 136 mmol/L (ref 135–145)
Total Bilirubin: 0.7 mg/dL (ref 0.3–1.2)
Total Protein: 7.4 g/dL (ref 6.5–8.1)

## 2019-03-09 LAB — CBC
HCT: 38.9 % (ref 36.0–46.0)
Hemoglobin: 12.3 g/dL (ref 12.0–15.0)
MCH: 26.4 pg (ref 26.0–34.0)
MCHC: 31.6 g/dL (ref 30.0–36.0)
MCV: 83.5 fL (ref 80.0–100.0)
Platelets: 248 10*3/uL (ref 150–400)
RBC: 4.66 MIL/uL (ref 3.87–5.11)
RDW: 15.1 % (ref 11.5–15.5)
WBC: 9 10*3/uL (ref 4.0–10.5)
nRBC: 0 % (ref 0.0–0.2)

## 2019-03-09 LAB — APTT: aPTT: 26 seconds (ref 24–36)

## 2019-03-09 LAB — PROTIME-INR
INR: 1.1 (ref 0.8–1.2)
Prothrombin Time: 14.2 seconds (ref 11.4–15.2)

## 2019-03-09 MED ORDER — DIAZEPAM 2 MG PO TABS: 2.0000 mg | ORAL_TABLET | Freq: Three times a day (TID) | ORAL | 0 refills | Status: DC | PRN

## 2019-03-09 NOTE — Telephone Encounter (Signed)
correct 

## 2019-03-09 NOTE — Telephone Encounter (Signed)
Pt called and states she was dizzy on Saturday and had woke up and broke out in a sweat. ENT friend came out and checked and checked bp 188/70 and pulse 58 sitting and 149/60 pulse 72 standing. States she had a stroke last year and only symptoms were dizziness. Advised pt to go directly to ED. She states she will have her husband drive her to [facility].

## 2019-03-09 NOTE — ED Triage Notes (Signed)
Pt reports on Saturday morning she woke up with dizziness, being off balance, with some nausea. States as long as she is looking straight ahead the dizziness is better. Pt does have gait disturbance with no focal weakness.

## 2019-03-12 ENCOUNTER — Telehealth: Payer: Self-pay | Admitting: Family Medicine

## 2019-03-12 DIAGNOSIS — R42 Dizziness and giddiness: Secondary | ICD-10-CM

## 2019-03-12 NOTE — ED Provider Notes (Signed)
[facility] EMERGENCY DEPARTMENT Provider Note   CSN: [id number] Arrival date & time: 03/09/19  1424     History   Chief Complaint Chief Complaint  Patient presents with  . Dizziness    HPI RHEANNON CERNEY is a 63 y.o. female.     HPI   63 year old female with dizziness.  She describes sensation of being very off balance and the room moving.  Has been intermittent for the past few days.  She has noticed it when getting up out of bed in the morning.  She will feel extremely off balance and has to steady herself.  Symptoms improved with rest/being still.  Associated nausea.  No vomiting.  No acute visual changes.  Denies any acute pain.  No tinnitus.   Past Medical History:  Diagnosis Date  . Hypertension   . Microproteinuria   . Neuropathy   . Noncompliance   . Osteopenia   . Stroke [facility])    residual "balance issues"  . Type 2 diabetes mellitus [facility])     Patient Active Problem List   Diagnosis Date Noted  . Abnormal EKG   . Intractable nausea and vomiting 04/18/2018  . Hypokalemia 04/18/2018  . Hypomagnesemia 04/18/2018  . HLD (hyperlipidemia) 04/18/2018  . Hyperlipidemia associated with type 2 diabetes mellitus ([place]) 03/02/2018  . CVA (cerebral vascular accident) ([place]) 02/24/2018  . Essential hypertension 02/24/2018  . Diabetes ([place]) 02/24/2018  . Acute lower UTI 02/24/2018  . Acute osteomyelitis of right foot ([place]) 04/22/2016  . Wound infection 03/15/2016    Past Surgical History:  Procedure Laterality Date  . ABDOMINAL HYSTERECTOMY    . BACK SURGERY       OB History   No obstetric history on file.      Home Medications    Prior to Admission medications   Medication Sig Start Date End Date Taking? Authorizing Provider  atorvastatin (LIPITOR) 10 MG tablet TAKE 1 TABLET BY MOUTH ONCE DAILY AT 6PM 03/05/19   Mikey Kirschner, MD  Cyanocobalamin (B-12 PO) Take 2 tablets by mouth daily.     [provider]  diazepam (VALIUM) 2 MG tablet Take 1  tablet (2 mg total) by mouth every 8 (eight) hours as needed (vertigo). 03/09/19   Virgel Manifold, MD  Insulin Glargine (LANTUS SOLOSTAR) 100 UNIT/ML Solostar Pen Inject 52 Units into the skin at bedtime.     [provider]  metFORMIN (GLUCOPHAGE-XR) 500 MG 24 hr tablet Take 1 tablet by mouth 4 (four) times daily. 02/02/18   [provider]  Multiple Vitamins-Minerals (ONE-A-DAY WOMENS VITACRAVES) CHEW Chew 1 tablet by mouth daily.    [provider]  phenol (CHLORASEPTIC) 1.4 % LIQD Use as directed 1-2 sprays in the mouth or throat as needed for throat irritation / pain. Patient not taking: Reported on 06/04/2018 04/19/18   Barton Dubois, MD  triamterene-hydrochlorothiazide (DYAZIDE) 37.5-25 MG capsule Take 1 each (1 capsule total) by mouth daily. 03/05/19   Mikey Kirschner, MD    Family History Family History  Problem Relation Age of Onset  . Diabetes Mother   . Breast cancer Mother   . Diabetes Father   . Deep vein thrombosis Sister     Social History Social History   Tobacco Use  . Smoking status: Never Smoker  . Smokeless tobacco: Never Used  Substance Use Topics  . Alcohol use: No  . Drug use: No     Allergies   Ace inhibitors, Glipizide, and Meclizine hcl  Review of Systems Review of Systems  All systems reviewed and negative, other than as noted in HPI.  Physical Exam Updated Vital Signs BP 136/64   Pulse 61   Temp 98.5 F (36.9 C) (Oral)   Resp 18   Ht 5\' 2"  (1.575 m)   Wt 69.4 kg   SpO2 98%   BMI 27.98 kg/m   Physical Exam Vitals signs and nursing note reviewed.  Constitutional:      General: She is not in acute distress.    Appearance: She is well-developed.  HENT:     Head: Normocephalic and atraumatic.  Eyes:     General:        Right eye: No discharge.        Left eye: No discharge.     Conjunctiva/sclera: Conjunctivae normal.  Neck:     Musculoskeletal: Neck supple.  Cardiovascular:     Rate and Rhythm:  Normal rate and regular rhythm.     Heart sounds: Normal heart sounds. No murmur. No friction rub. No gallop.   Pulmonary:     Effort: Pulmonary effort is normal. No respiratory distress.     Breath sounds: Normal breath sounds.  Abdominal:     General: There is no distension.     Palpations: Abdomen is soft.     Tenderness: There is no abdominal tenderness.  Musculoskeletal:        General: No tenderness.  Skin:    General: Skin is warm and dry.  Neurological:     General: No focal deficit present.     Mental Status: She is alert.     Cranial Nerves: Cranial nerve deficit present.     Sensory: Sensory deficit present.     Motor: No weakness.     Coordination: Coordination normal.     Comments: Gait is steady.  Good finger-nose testing bilaterally.  Psychiatric:        Behavior: Behavior normal.        Thought Content: Thought content normal.      ED Treatments / Results  Labs (all labs ordered are listed, but only abnormal results are displayed) Labs Reviewed  COMPREHENSIVE METABOLIC PANEL - Abnormal; Notable for the following components:      Result Value   Glucose, Bld 165 (*)    Creatinine, Ser 1.06 (*)    GFR calc non Af Amer 55 (*)    All other components within normal limits  PROTIME-INR  APTT  CBC  DIFFERENTIAL    EKG EKG Interpretation  Date/Time:  Monday March 09 2019 19:50:57 EDT Ventricular Rate:  59 PR Interval:    QRS Duration: 96 QT Interval:  413 QTC Calculation: 410 R Axis:   2 Text Interpretation:  Sinus rhythm Low voltage, precordial leads Nonspecific T abnormalities, lateral leads No significant change was found Confirmed by Paula LibraMolpus, John ([id number]) on 03/10/2019 12:13:06 PM   Radiology No results found.  Procedures Procedures (including critical care time)  Medications Ordered in ED Medications - No data to display   Initial Impression / Assessment and Plan / ED Course  I have reviewed the triage vital signs and the nursing notes.   Pertinent labs & imaging results that were available during my care of the patient were reviewed by me and considered in my medical decision making (see chart for details).        63 year old female with symptoms that seem very consistent with peripheral vertigo.  I doubt central etiology such as stroke.  Her  exam is nonfocal.  She is currently asymptomatic.  She reports that she took meclizine in the distant past but did not tolerate it very well.  We will try low-dose Valium as needed.  Return precautions were discussed.  Outpatient follow-up otherwise. Final Clinical Impressions(s) / ED Diagnoses   Final diagnoses:  Vertigo    ED Discharge Orders         Ordered    diazepam (VALIUM) 2 MG tablet  Every 8 hours PRN     03/09/19 2106           Raeford RazorKohut, Latrece Nitta, MD 03/12/19 1034

## 2019-03-12 NOTE — Telephone Encounter (Signed)
Patient is requesting a referral to ear,nose throat doctor due to the fact she had to go to ER for vertigo on 09/24/15 and cant take certain medications and would like to be check out by specialist.

## 2019-03-13 NOTE — Telephone Encounter (Signed)
Pt contacted and advised that we have placed referral for ENT. Also informed pt to let neurologist know that she will be seeing an ENT cause may also be neurological. Pt verbalized understanding

## 2019-03-13 NOTE — Telephone Encounter (Signed)
Ok lets do, but advise her to be sure she tells her neuro she is seeing in five days cause may also be neurological

## 2019-03-16 NOTE — Progress Notes (Signed)
NEUROLOGY FOLLOW UP OFFICE NOTE  [name] StarrJanice P [name] [id number]  HISTORY OF PRESENT ILLNESS: [name] PacJanice [name] is a 63 year old left-handed woman with hypertension and type 2 diabetes with neuropathy who follows up for stroke.  UPDATE: Current medications: Aspirin 81 mg daily, atorvastatin 10 mg daily, insulin, metformin, Dyazide   24 hour Holter monitor was again ordered but never performed.    No residual symptoms except when she gets tired, she notes some mild heaviness in the left hand, or she may feel a little off balance.    She presented to the ED at [facility] on 03/09/19 for intermittent vertigo over the prior few days, described as sensation of being off balance with spinning and nausea, aggravated by movement and change in position and relieved by rest and being still.  No associated double vision, slurred speech or unilateral numbness or weakness.  CT head personally reviewed and was negative for acute intracranial abnormality.  She was diagnosed with BPPV and prescribed meclizine (had adverse reaction) and low-dose Valium.  No severe spells since then but sometimes she notes some mild spinning in the morning when she gets up or turns over in bed.  [facility]: [facility] double vision, right sided facial numbness and tingling, left-sided weakness and unsteady gait. CT of head showed no acute findings. MRI of brain showed small right dorsal medial pontine infarct. She did not receive tPA due to late presentation. MRA of head demonstrated mild right and moderate left PCA stenosis; PICA, superior cerebellar arteries, basilar artery and vertebral arteries patent. Carotid doppler revealed no hemodynamically significant ICA stenosis. TTE showed EF 60-65% with no cardiac source of emboli. Labs included LDL 78, Hgb A1c 10.1 and negative HIV. She was discharged on ASA 325mg  daily and atorvastatin 10mg  daily. She was also treated  for UTI.  Since hospitalization, she has overall improved.Diplopia has resolved.However, she had a setback after developing gastroenteritis, for which she was treated in the hospital on 04/18/18. CT of head showed no acute changes. She was found to be dehydrated with low potassium and magnesium. She now feels more fatigued again.She has baseline unsteady gait due to diabetic neuropathy but feels a little more off-balance.  PAST MEDICAL HISTORY: Past Medical History:  Diagnosis Date  . Hypertension   . Microproteinuria   . Neuropathy   . Noncompliance   . Osteopenia   . Stroke [facility](HCC)    residual "balance issues"  . Type 2 diabetes mellitus (HCC)     MEDICATIONS: Current Outpatient Medications on File Prior to Visit  Medication Sig Dispense Refill  . atorvastatin (LIPITOR) 10 MG tablet TAKE 1 TABLET BY MOUTH ONCE DAILY AT 6PM 30 tablet 5  . Cyanocobalamin (B-12 PO) Take 2 tablets by mouth daily.     . diazepam (VALIUM) 2 MG tablet Take 1 tablet (2 mg total) by mouth every 8 (eight) hours as needed (vertigo). 10 tablet 0  . Insulin Glargine (LANTUS SOLOSTAR) 100 UNIT/ML Solostar Pen Inject 52 Units into the skin at bedtime.     . metFORMIN (GLUCOPHAGE-XR) 500 MG 24 hr tablet Take 1 tablet by mouth 4 (four) times daily.  2  . Multiple Vitamins-Minerals (ONE-A-DAY WOMENS VITACRAVES) CHEW Chew 1 tablet by mouth daily.    . phenol (CHLORASEPTIC) 1.4 % LIQD Use as directed 1-2 sprays in the mouth or throat as needed for throat irritation / pain. (Patient not taking: Reported on 06/04/2018)    . triamterene-hydrochlorothiazide (DYAZIDE)  37.5-25 MG capsule Take 1 each (1 capsule total) by mouth daily. 30 capsule 5   No current facility-administered medications on file prior to visit.     ALLERGIES: Allergies  Allergen Reactions  . Ace Inhibitors Cough  . Glipizide   . Meclizine Hcl     FAMILY HISTORY: Family History  Problem Relation Age of Onset  . Diabetes Mother   . Breast  cancer Mother   . Diabetes Father   . Deep vein thrombosis Sister     SOCIAL HISTORY: Social History   Socioeconomic History  . Marital status: Married    Spouse name: Zenia Resides  . Number of children: 2  . Years of education: Not on file  . Highest education level: Some college, no degree  Occupational History    Employer: [employer]  Social Needs  . Financial resource strain: Not on file  . Food insecurity    Worry: Not on file    Inability: Not on file  . Transportation needs    Medical: Not on file    Non-medical: Not on file  Tobacco Use  . Smoking status: Never Smoker  . Smokeless tobacco: Never Used  Substance and Sexual Activity  . Alcohol use: No  . Drug use: No  . Sexual activity: Not on file  Lifestyle  . Physical activity    Days per week: Not on file    Minutes per session: Not on file  . Stress: Not on file  Relationships  . Social Herbalist on phone: Not on file    Gets together: Not on file    Attends religious service: Not on file    Active member of club or organization: Not on file    Attends meetings of clubs or organizations: Not on file    Relationship status: Not on file  . Intimate partner violence    Fear of current or ex partner: Not on file    Emotionally abused: Not on file    Physically abused: Not on file    Forced sexual activity: Not on file  Other Topics Concern  . Not on file  Social History Narrative   Patient is left-handed. She lives with her husband ina one level home with a basement. She occasionally drinks coffee and has 2-3 diest sodas a day. She does not exercise.    REVIEW OF SYSTEMS: Constitutional: No fevers, chills, or sweats, no generalized fatigue, change in appetite Eyes: No visual changes, double vision, eye pain Ear, nose and throat: No hearing loss, ear pain, nasal congestion, sore throat Cardiovascular: No chest pain, palpitations Respiratory:  No shortness of breath at rest or with exertion, wheezes  GastrointestinaI: No nausea, vomiting, diarrhea, abdominal pain, fecal incontinence Genitourinary:  No dysuria, urinary retention or frequency Musculoskeletal:  No neck pain, back pain Integumentary: No rash, pruritus, skin lesions Neurological: as above Psychiatric: No depression, insomnia, anxiety Endocrine: No palpitations, fatigue, diaphoresis, mood swings, change in appetite, change in weight, increased thirst Hematologic/Lymphatic:  No purpura, petechiae. Allergic/Immunologic: no itchy/runny eyes, nasal congestion, recent allergic reactions, rashes  PHYSICAL EXAM: Blood pressure (!) 145/74, pulse 61, temperature 98.4 F (36.9 C), temperature source Oral, height 5\' 2"  (1.575 m), weight 158 lb (71.7 kg), SpO2 97 %. General: No acute distress.  Patient appears well-groomed.   Head:  Normocephalic/atraumatic Eyes:  Fundi examined but not visualized Neck: supple, no paraspinal tenderness, full range of motion Heart:  Regular rate and rhythm Lungs:  Clear to auscultation  bilaterally Back: No paraspinal tenderness Neurological Exam: alert and oriented to person, place, and time. Attention span and concentration intact, recent and remote memory intact, fund of knowledge intact.  Speech fluent and not dysarthric, language intact.  CN II-XII intact. Bulk and tone normal, muscle strength 5/5 throughout.  Sensation to light touch, temperature and vibration intact.  Deep tendon reflexes 2+ throughout, toes downgoing.  Finger to nose and heel to shin testing intact.  Gait mildly unsteady, Romberg negative.  IMPRESSION: 1.  Right dorsal medial pontine infarct, likely secondary to small vessel disease 2.  Hypertension 3.  Hyperlipidemia 4.  Benign paroxysmal positional vertigo  At this point, I don't think we need the 24 hour Holter.  We are now over a year out from the stroke.  Telemetry in the hospital did not reveal anything and the stroke appears more consistent with small vessel disease  etiology anyway.  PLAN: 1.  Aspirin 81 mg daily for secondary stroke prevention 2.  Atorvastatin (LDL goal less than 70) 3.  Blood pressure and glycemic control (follow up blood pressure with PCP) 4.  Mediterranean diet 5.  If vertigo spells return, consider vestibular rehab 6.  Follow up as needed.  17 minutes spent face to face with patient, over 50% spent discussing management.  Shon MilletAdam Aslee Such, DO  CC: Dr. Gerda DissLuking

## 2019-03-17 ENCOUNTER — Encounter: Payer: Self-pay | Admitting: Neurology

## 2019-03-17 ENCOUNTER — Ambulatory Visit (INDEPENDENT_AMBULATORY_CARE_PROVIDER_SITE_OTHER): Payer: BC Managed Care – PPO | Admitting: Neurology

## 2019-03-17 ENCOUNTER — Other Ambulatory Visit: Payer: Self-pay

## 2019-03-17 VITALS — BP 145/74 | HR 61 | Temp 98.4°F | Ht 62.0 in | Wt 158.0 lb

## 2019-03-17 DIAGNOSIS — Z794 Long term (current) use of insulin: Secondary | ICD-10-CM

## 2019-03-17 DIAGNOSIS — E1142 Type 2 diabetes mellitus with diabetic polyneuropathy: Secondary | ICD-10-CM

## 2019-03-17 DIAGNOSIS — H8110 Benign paroxysmal vertigo, unspecified ear: Secondary | ICD-10-CM | POA: Diagnosis not present

## 2019-03-17 DIAGNOSIS — I6350 Cerebral infarction due to unspecified occlusion or stenosis of unspecified cerebral artery: Secondary | ICD-10-CM

## 2019-03-17 DIAGNOSIS — I10 Essential (primary) hypertension: Secondary | ICD-10-CM

## 2019-03-17 NOTE — Patient Instructions (Addendum)
1.  Continue aspirin 81mg  daily 2.  Continue cholesterol medication 3.  Continue blood pressure medication.  Blood pressure a little elevated today.  Follow up with PCP. 4.  Mediterranean diet (see below) 5.  If vertigo spells return, there is physical therapy for it 6.  Follow up as needed.   Mediterranean Diet A Mediterranean diet refers to food and lifestyle choices that are based on the traditions of countries located on the [place]. This way of eating has been shown to help prevent certain conditions and improve outcomes for people who have chronic diseases, like kidney disease and heart disease. What are tips for following this plan? Lifestyle  Cook and eat meals together with your family, when possible.  Drink enough fluid to keep your urine clear or pale yellow.  Be physically active every day. This includes: ? Aerobic exercise like running or swimming. ? Leisure activities like gardening, walking, or housework.  Get 7-8 hours of sleep each night.  If recommended by your health care provider, drink red wine in moderation. This means 1 glass a day for nonpregnant women and 2 glasses a day for men. A glass of wine equals 5 oz (150 mL). Reading food labels   Check the serving size of packaged foods. For foods such as rice and pasta, the serving size refers to the amount of cooked product, not dry.  Check the total fat in packaged foods. Avoid foods that have saturated fat or trans fats.  Check the ingredients list for added sugars, such as corn syrup. Shopping  At the grocery store, buy most of your food from the areas near the walls of the store. This includes: ? Fresh fruits and vegetables (produce). ? Grains, beans, nuts, and seeds. Some of these may be available in unpackaged forms or large amounts (in bulk). ? Fresh seafood. ? Poultry and eggs. ? Low-fat dairy products.  Buy whole ingredients instead of prepackaged foods.  Buy fresh fruits and vegetables  in-season from local farmers markets.  Buy frozen fruits and vegetables in resealable bags.  If you do not have access to quality fresh seafood, buy precooked frozen shrimp or canned fish, such as tuna, salmon, or sardines.  Buy small amounts of raw or cooked vegetables, salads, or olives from the deli or salad bar at your store.  Stock your pantry so you always have certain foods on hand, such as olive oil, canned tuna, canned tomatoes, rice, pasta, and beans. Cooking  Cook foods with extra-virgin olive oil instead of using butter or other vegetable oils.  Have meat as a side dish, and have vegetables or grains as your main dish. This means having meat in small portions or adding small amounts of meat to foods like pasta or stew.  Use beans or vegetables instead of meat in common dishes like chili or lasagna.  Experiment with different cooking methods. Try roasting or broiling vegetables instead of steaming or sauteing them.  Add frozen vegetables to soups, stews, pasta, or rice.  Add nuts or seeds for added healthy fat at each meal. You can add these to yogurt, salads, or vegetable dishes.  Marinate fish or vegetables using olive oil, lemon juice, garlic, and fresh herbs. Meal planning   Plan to eat 1 vegetarian meal one day each week. Try to work up to 2 vegetarian meals, if possible.  Eat seafood 2 or more times a week.  Have healthy snacks readily available, such as: ? Vegetable sticks with hummus. ? [place]  yogurt. ? Fruit and nut trail mix.  Eat balanced meals throughout the week. This includes: ? Fruit: 2-3 servings a day ? Vegetables: 4-5 servings a day ? Low-fat dairy: 2 servings a day ? Fish, poultry, or lean meat: 1 serving a day ? Beans and legumes: 2 or more servings a week ? Nuts and seeds: 1-2 servings a day ? Whole grains: 6-8 servings a day ? Extra-virgin olive oil: 3-4 servings a day  Limit red meat and sweets to only a few servings a month What are my  food choices?  Mediterranean diet ? Recommended  Grains: Whole-grain pasta. Brown rice. Bulgar wheat. Polenta. Couscous. Whole-wheat bread. [place].  Vegetables: Artichokes. Beets. Broccoli. Cabbage. Carrots. Eggplant. Green beans. Chard. Kale. Spinach. Onions. Leeks. Peas. Squash. Tomatoes. Peppers. Radishes.  Fruits: Apples. Apricots. Avocado. Berries. Bananas. Cherries. Dates. Figs. Grapes. Lemons. Melon. Oranges. Peaches. Plums. Pomegranate.  Meats and other protein foods: Beans. Almonds. Sunflower seeds. Pine nuts. Peanuts. [place]. Salmon. Scallops. Shrimp. [place]. Tilapia. Clams. Oysters. Eggs.  Dairy: Low-fat milk. Cheese. Greek yogurt.  Beverages: Water. Red wine. Herbal tea.  Fats and oils: Extra virgin olive oil. Avocado oil. Grape seed oil.  Sweets and desserts: [place] yogurt with honey. Baked apples. Poached pears. Trail mix.  Seasoning and other foods: Basil. Cilantro. Coriander. Cumin. Mint. Parsley. Sage. Rosemary. Tarragon. Garlic. Oregano. Thyme. Pepper. Balsalmic vinegar. Tahini. Hummus. Tomato sauce. Olives. Mushrooms. ? Limit these  Grains: Prepackaged pasta or rice dishes. Prepackaged cereal with added sugar.  Vegetables: Deep fried potatoes (french fries).  Fruits: Fruit canned in syrup.  Meats and other protein foods: Beef. Pork. Lamb. Poultry with skin. Hot dogs. Berniece Salines.  Dairy: Ice cream. Sour cream. Whole milk.  Beverages: Juice. Sugar-sweetened soft drinks. Beer. Liquor and spirits.  Fats and oils: Butter. Canola oil. Vegetable oil. Beef fat (tallow). Lard.  Sweets and desserts: Cookies. Cakes. Pies. Candy.  Seasoning and other foods: Mayonnaise. Premade sauces and marinades. The items listed may not be a complete list. Talk with your dietitian about what dietary choices are right for you. Summary  The Mediterranean diet includes both food and lifestyle choices.  Eat a variety of fresh fruits and vegetables, beans, nuts, seeds, and whole  grains.  Limit the amount of red meat and sweets that you eat.  Talk with your health care provider about whether it is safe for you to drink red wine in moderation. This means 1 glass a day for nonpregnant women and 2 glasses a day for men. A glass of wine equals 5 oz (150 mL). This information is not intended to replace advice given to you by your health care provider. Make sure you discuss any questions you have with your health care provider. Document Released: 03/22/2016 Document Revised: 03/29/2016 Document Reviewed: 03/22/2016 Elsevier Patient Education  2020 [employer].

## 2019-03-23 ENCOUNTER — Encounter: Payer: Self-pay | Admitting: Family Medicine

## 2019-04-16 DIAGNOSIS — E1165 Type 2 diabetes mellitus with hyperglycemia: Secondary | ICD-10-CM | POA: Diagnosis not present

## 2019-04-16 DIAGNOSIS — Z8489 Family history of other specified conditions: Secondary | ICD-10-CM | POA: Diagnosis not present

## 2019-04-16 DIAGNOSIS — Z794 Long term (current) use of insulin: Secondary | ICD-10-CM | POA: Diagnosis not present

## 2019-04-16 DIAGNOSIS — E1142 Type 2 diabetes mellitus with diabetic polyneuropathy: Secondary | ICD-10-CM | POA: Diagnosis not present

## 2019-04-16 DIAGNOSIS — Z8673 Personal history of transient ischemic attack (TIA), and cerebral infarction without residual deficits: Secondary | ICD-10-CM | POA: Diagnosis not present

## 2019-04-30 ENCOUNTER — Ambulatory Visit (INDEPENDENT_AMBULATORY_CARE_PROVIDER_SITE_OTHER): Payer: BC Managed Care – PPO | Admitting: Otolaryngology

## 2019-05-05 DIAGNOSIS — E663 Overweight: Secondary | ICD-10-CM | POA: Diagnosis not present

## 2019-05-05 DIAGNOSIS — E1140 Type 2 diabetes mellitus with diabetic neuropathy, unspecified: Secondary | ICD-10-CM | POA: Diagnosis not present

## 2019-05-21 ENCOUNTER — Other Ambulatory Visit: Payer: Self-pay

## 2019-05-21 ENCOUNTER — Ambulatory Visit (INDEPENDENT_AMBULATORY_CARE_PROVIDER_SITE_OTHER): Payer: BC Managed Care – PPO | Admitting: Otolaryngology

## 2019-05-21 DIAGNOSIS — R42 Dizziness and giddiness: Secondary | ICD-10-CM

## 2019-05-21 DIAGNOSIS — I69393 Ataxia following cerebral infarction: Secondary | ICD-10-CM | POA: Diagnosis not present

## 2019-05-21 DIAGNOSIS — H903 Sensorineural hearing loss, bilateral: Secondary | ICD-10-CM | POA: Diagnosis not present

## 2019-05-28 DIAGNOSIS — Z23 Encounter for immunization: Secondary | ICD-10-CM | POA: Diagnosis not present

## 2019-06-02 DIAGNOSIS — H903 Sensorineural hearing loss, bilateral: Secondary | ICD-10-CM | POA: Diagnosis not present

## 2019-06-02 DIAGNOSIS — R42 Dizziness and giddiness: Secondary | ICD-10-CM | POA: Diagnosis not present

## 2019-06-04 ENCOUNTER — Ambulatory Visit (INDEPENDENT_AMBULATORY_CARE_PROVIDER_SITE_OTHER): Payer: BC Managed Care – PPO | Admitting: Otolaryngology

## 2019-06-04 DIAGNOSIS — H8111 Benign paroxysmal vertigo, right ear: Secondary | ICD-10-CM

## 2019-06-04 DIAGNOSIS — R42 Dizziness and giddiness: Secondary | ICD-10-CM

## 2019-06-18 ENCOUNTER — Ambulatory Visit (INDEPENDENT_AMBULATORY_CARE_PROVIDER_SITE_OTHER): Payer: BC Managed Care – PPO | Admitting: Otolaryngology

## 2019-06-18 DIAGNOSIS — R42 Dizziness and giddiness: Secondary | ICD-10-CM | POA: Diagnosis not present

## 2019-06-18 DIAGNOSIS — H8111 Benign paroxysmal vertigo, right ear: Secondary | ICD-10-CM | POA: Diagnosis not present

## 2019-07-02 ENCOUNTER — Ambulatory Visit (INDEPENDENT_AMBULATORY_CARE_PROVIDER_SITE_OTHER): Payer: BC Managed Care – PPO | Admitting: Otolaryngology

## 2019-07-02 ENCOUNTER — Other Ambulatory Visit: Payer: Self-pay

## 2019-07-02 DIAGNOSIS — H35432 Paving stone degeneration of retina, left eye: Secondary | ICD-10-CM | POA: Diagnosis not present

## 2019-07-02 DIAGNOSIS — E113393 Type 2 diabetes mellitus with moderate nonproliferative diabetic retinopathy without macular edema, bilateral: Secondary | ICD-10-CM | POA: Diagnosis not present

## 2019-07-02 DIAGNOSIS — H35033 Hypertensive retinopathy, bilateral: Secondary | ICD-10-CM | POA: Diagnosis not present

## 2019-07-02 DIAGNOSIS — H2513 Age-related nuclear cataract, bilateral: Secondary | ICD-10-CM | POA: Diagnosis not present

## 2019-07-02 LAB — HM DIABETES EYE EXAM

## 2019-07-06 ENCOUNTER — Ambulatory Visit (INDEPENDENT_AMBULATORY_CARE_PROVIDER_SITE_OTHER): Payer: BC Managed Care – PPO | Admitting: Otolaryngology

## 2019-07-06 ENCOUNTER — Other Ambulatory Visit: Payer: Self-pay

## 2019-07-17 DIAGNOSIS — Z8673 Personal history of transient ischemic attack (TIA), and cerebral infarction without residual deficits: Secondary | ICD-10-CM | POA: Diagnosis not present

## 2019-07-17 DIAGNOSIS — Z794 Long term (current) use of insulin: Secondary | ICD-10-CM | POA: Diagnosis not present

## 2019-07-17 DIAGNOSIS — Z8489 Family history of other specified conditions: Secondary | ICD-10-CM | POA: Diagnosis not present

## 2019-07-17 DIAGNOSIS — E1142 Type 2 diabetes mellitus with diabetic polyneuropathy: Secondary | ICD-10-CM | POA: Diagnosis not present

## 2019-07-23 ENCOUNTER — Ambulatory Visit (INDEPENDENT_AMBULATORY_CARE_PROVIDER_SITE_OTHER): Payer: BC Managed Care – PPO | Admitting: Otolaryngology

## 2019-07-31 DIAGNOSIS — E113393 Type 2 diabetes mellitus with moderate nonproliferative diabetic retinopathy without macular edema, bilateral: Secondary | ICD-10-CM | POA: Diagnosis not present

## 2019-07-31 DIAGNOSIS — H2513 Age-related nuclear cataract, bilateral: Secondary | ICD-10-CM | POA: Diagnosis not present

## 2019-07-31 DIAGNOSIS — Z794 Long term (current) use of insulin: Secondary | ICD-10-CM | POA: Diagnosis not present

## 2019-07-31 DIAGNOSIS — H2511 Age-related nuclear cataract, right eye: Secondary | ICD-10-CM | POA: Diagnosis not present

## 2019-09-02 IMAGING — DX DG ABDOMEN ACUTE W/ 1V CHEST
3 series · 3 of 3 positions shown · non-contrast
Comparison: CT abdomen and pelvis December 09, 2008; chest radiograph
February 02, 2016

CLINICAL DATA: Nausea and vomiting with pain

EXAM:
DG ABDOMEN ACUTE W/ 1V CHEST

[chest pa]
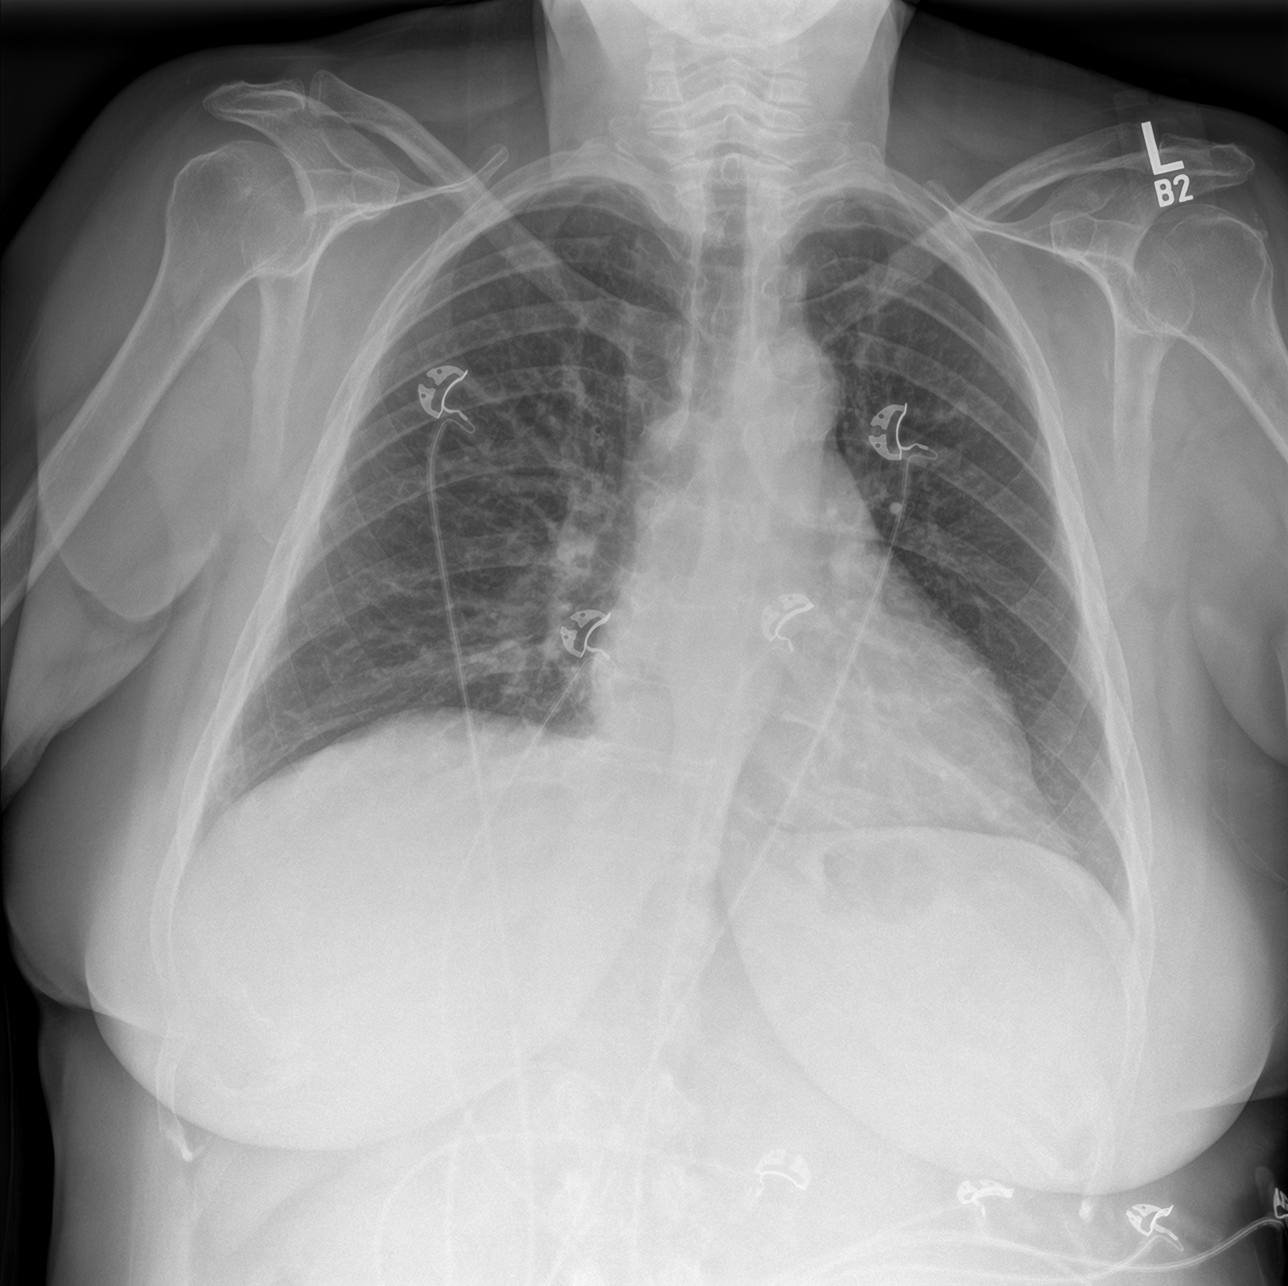

[abdomen erect]
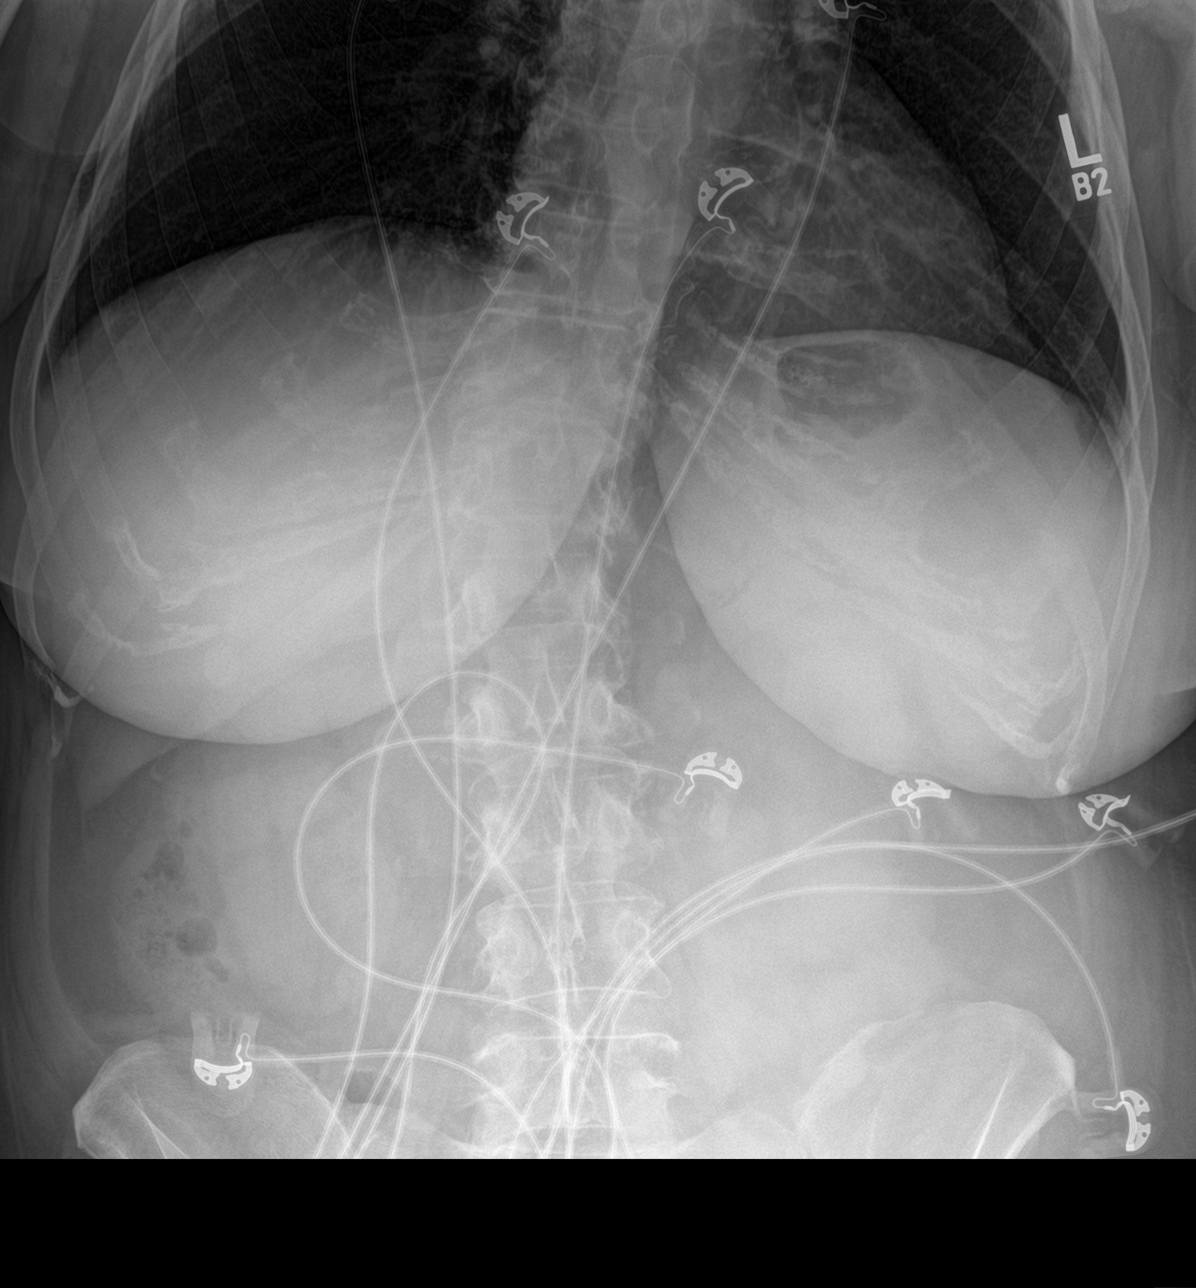

[abdomen supine]
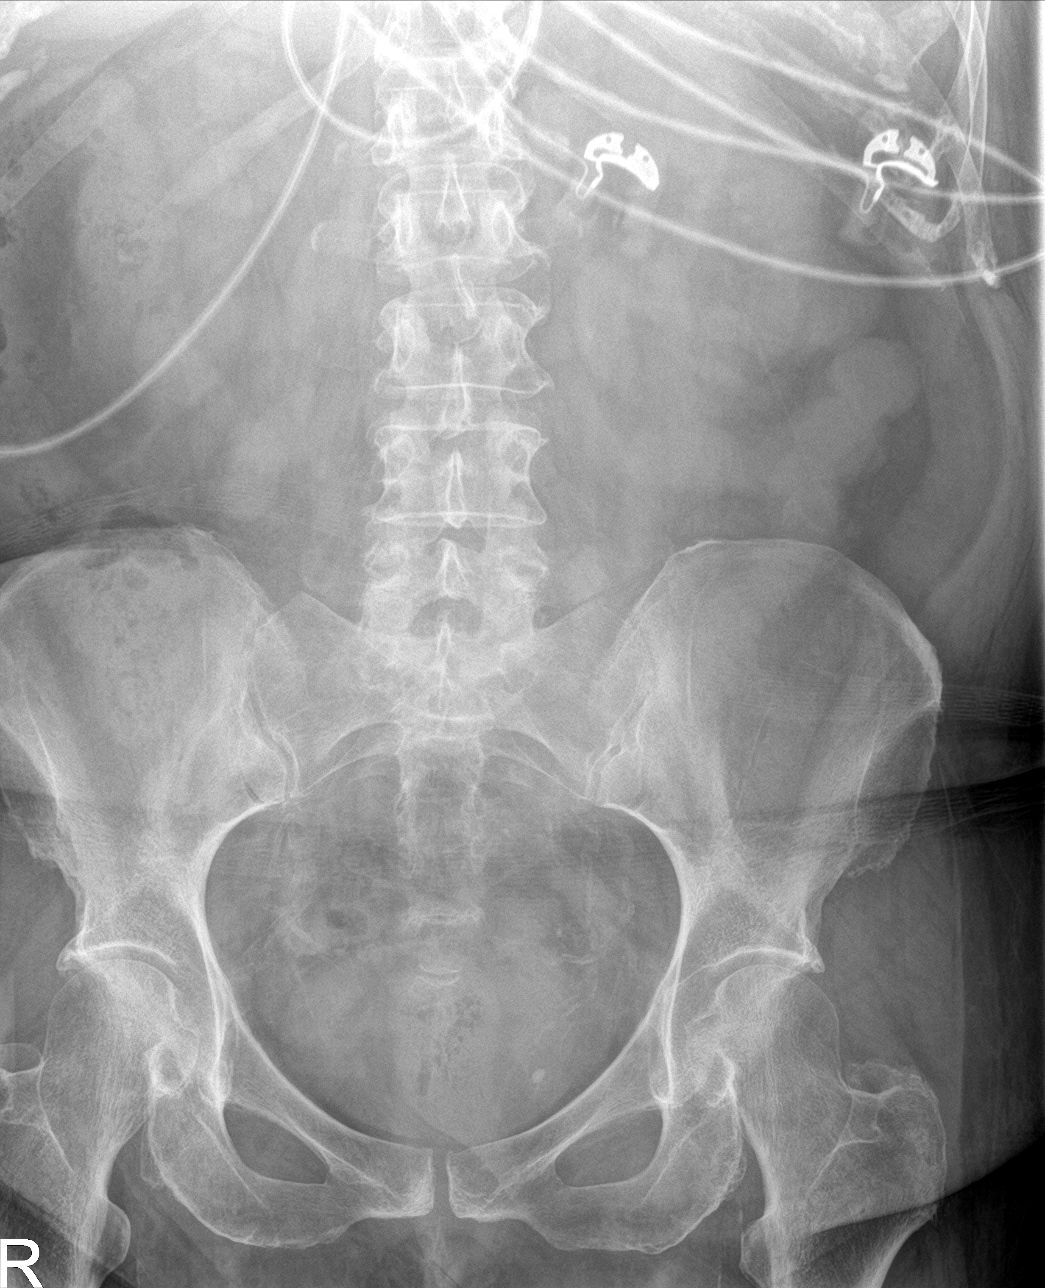

[3 of 3 positions shown; findings below may reference images not displayed]

FINDINGS: PA chest: There is no edema or consolidation. The heart size and
pulmonary vascularity are normal. No adenopathy.

Supine and upright abdomen: There is moderate stool throughout the
colon. There is relative paucity of gas. No bowel dilatation or
air-fluid level to suggest bowel obstruction. No free air. There are
vascular calcifications in the pelvis.
IMPRESSION: Relative paucity of bowel gas. This is a finding that may be seen
normally but also may be indicative enteritis or early ileus. Bowel
obstruction not felt to be likely. No free air.

No lung edema or consolidation. Iliac artery atherosclerotic change
noted.

## 2019-09-02 IMAGING — CT CT HEAD W/O CM
3 series · 15 of 47 positions shown, 18 images · non-contrast
Comparison: 02/24/2018 CT head, MR brain 02/24/2018

CLINICAL DATA: Nausea, vomiting, mild abdominal pain, recent
stroke, hypertension, type II diabetes mellitus

EXAM:
CT HEAD WITHOUT CONTRAST
TECHNIQUE: Contiguous axial images were obtained from the base of the skull
through the vertex without intravenous contrast. Sagittal and
coronal MPR images reconstructed from axial data set.

[Series 3: head wo · axial · 0.44mm/px · z∈[-22,+128]mm · 9 of 36 slices shown, 12 images]
[im 3/36  brain]
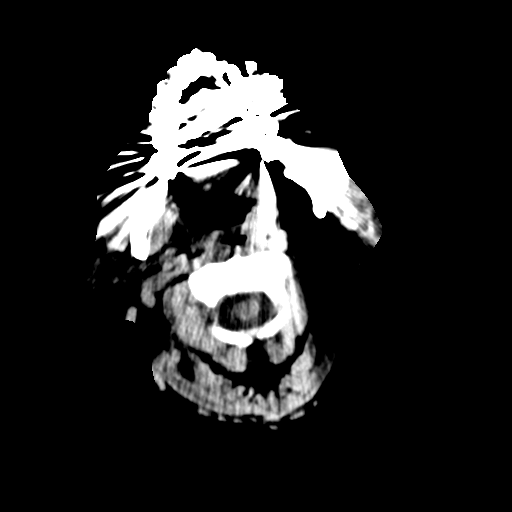
[im 3/36  bone]
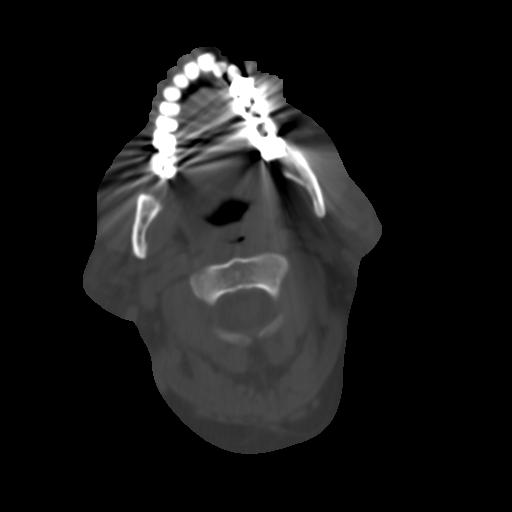
[im 7/36  brain]
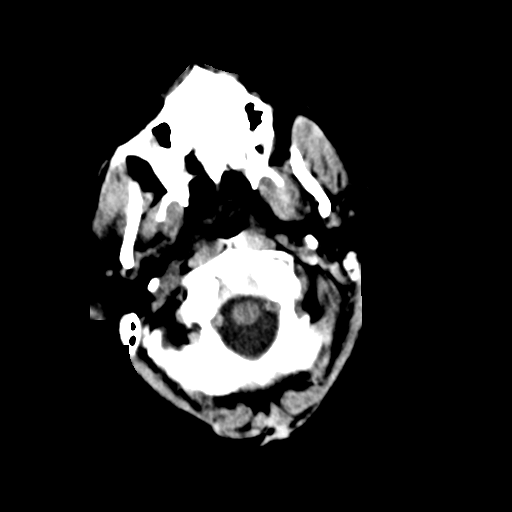
[im 10/36  brain]
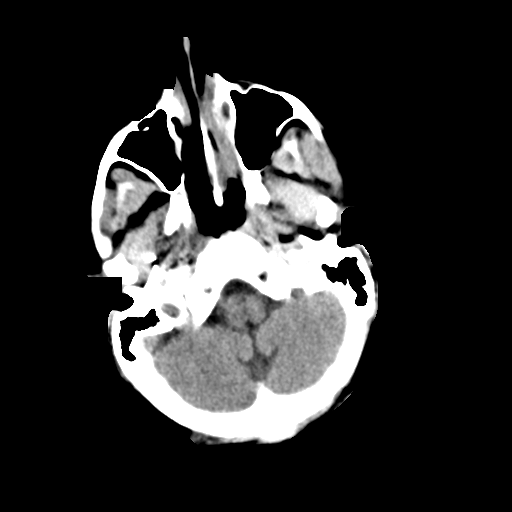
[im 14/36  brain]
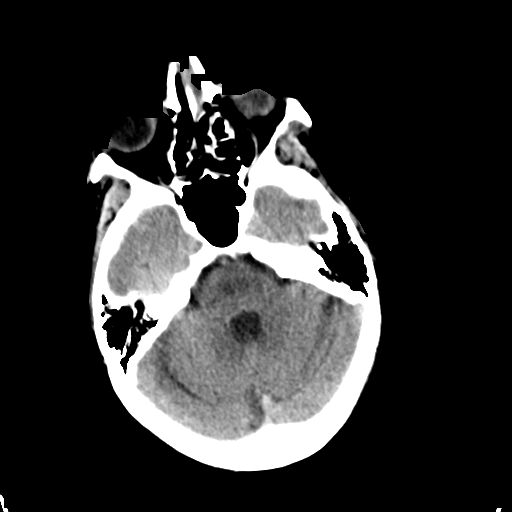
[im 19/36  brain]
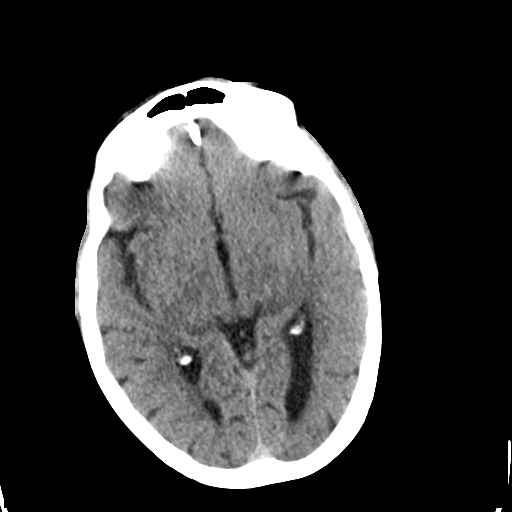
[im 19/36  bone]
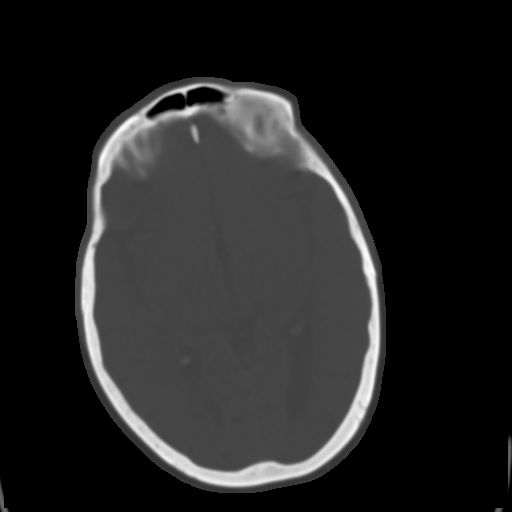
[im 22/36  brain]
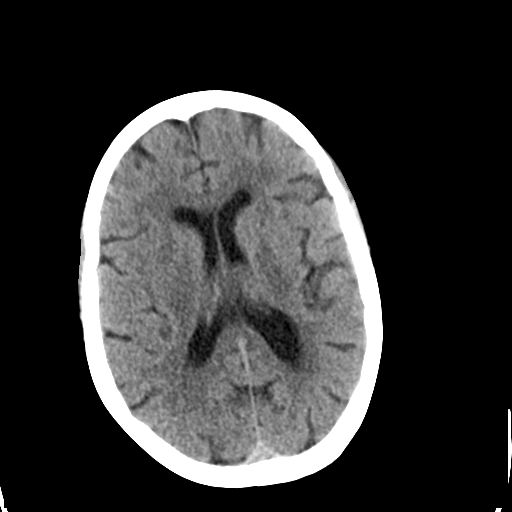
[im 26/36  brain]
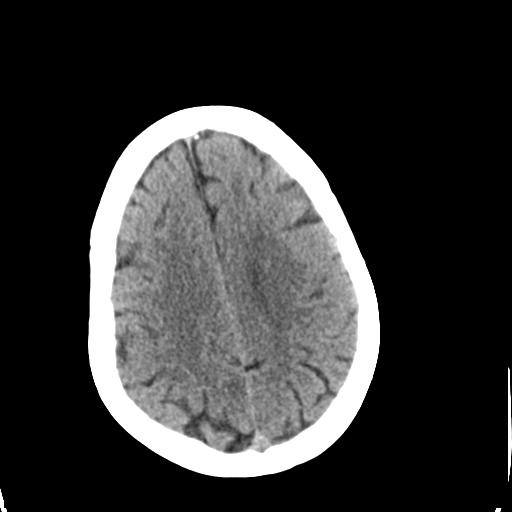
[im 29/36  brain]
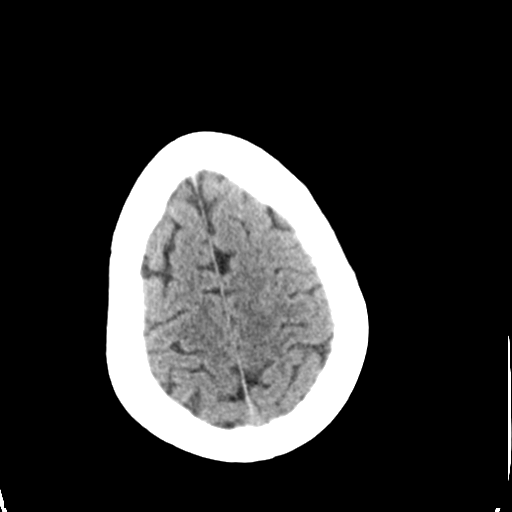
[im 33/36  brain]
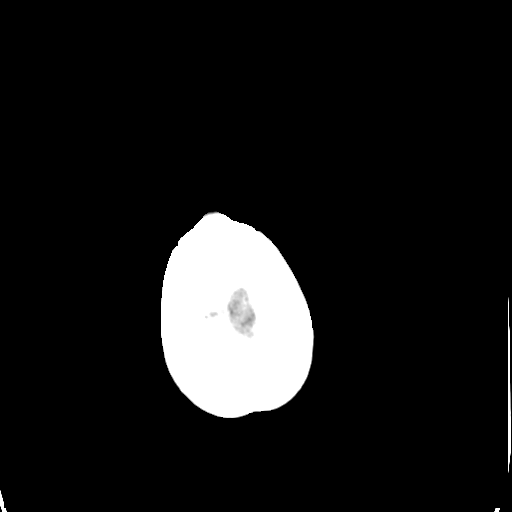
[im 33/36  bone]
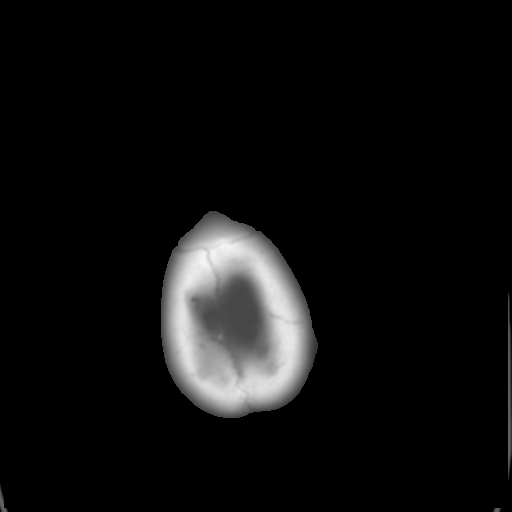

[Series 5: coronal soft tissue · coronal · 0.32mm/px · 3 of 71 slices shown]
[im 24/71  brain]
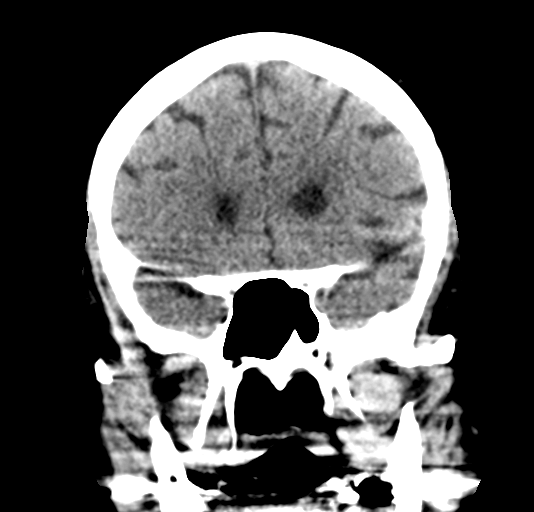
[im 32/71  brain]
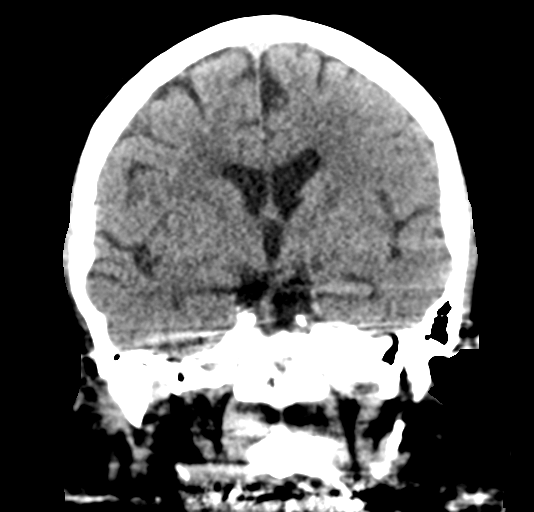
[im 39/71  brain]
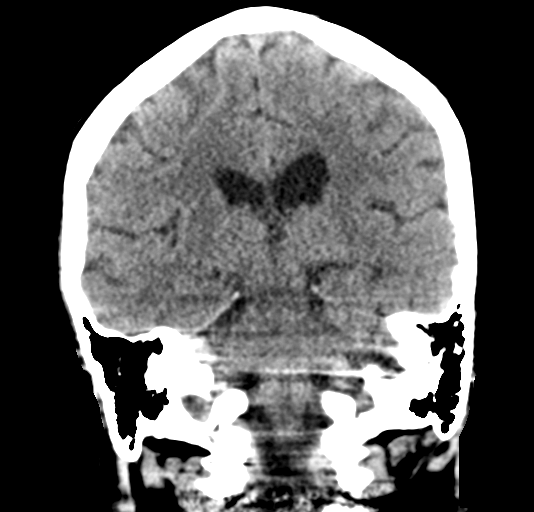

[Series 6: sagittal soft tissue · sagittal · 0.35mm/px · 3 of 67 slices shown]
[im 23/67  brain]
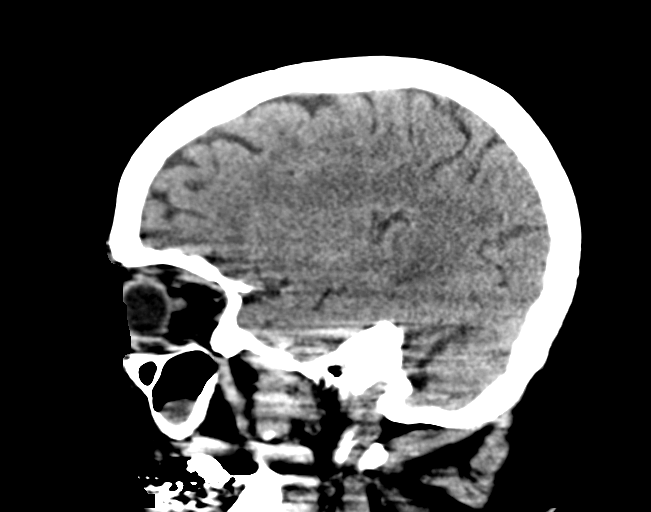
[im 34/67  brain]
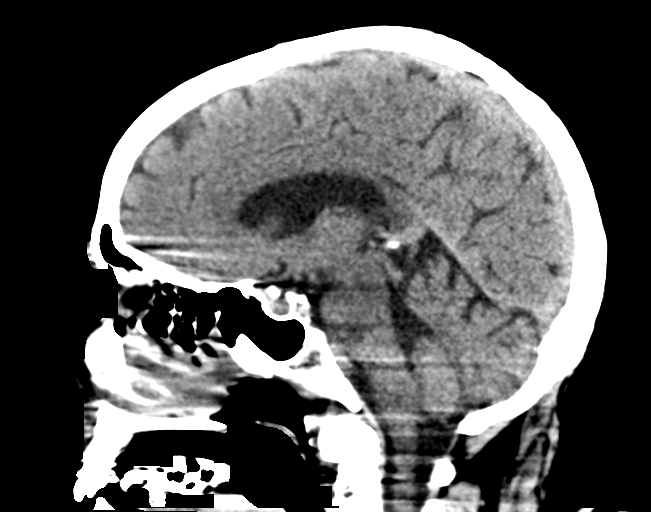
[im 45/67  brain]
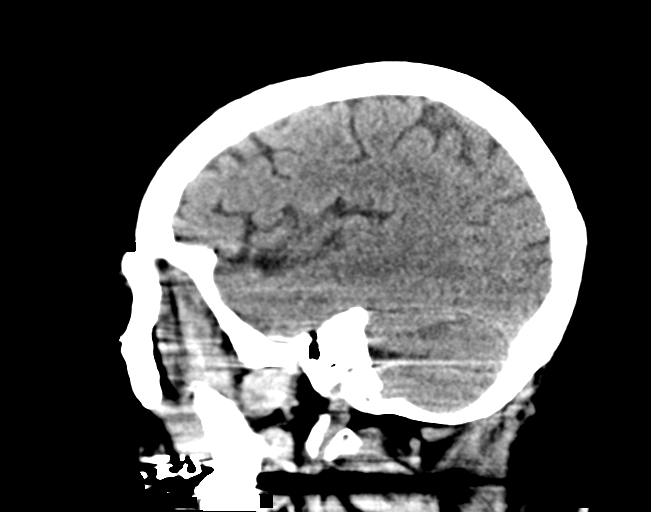

[15 of 47 positions shown; findings below may reference images not displayed]

FINDINGS: Brain: Normal ventricular morphology. No midline shift or mass
effect. Normal appearance of brain parenchyma. No intracranial
hemorrhage, mass lesion, or evidence of acute infarction. No
extra-axial fluid collections. Specifically, small pontine infarct
identified on most recent MR brain is not visualized.

Vascular: Minimal atherosclerotic calcification of internal carotid
arteries at skull base. No hyperdense vessels.

Skull: Intact

Sinuses/Orbits: Clear

Other: N/A
IMPRESSION: No acute intracranial abnormalities.

## 2019-09-17 ENCOUNTER — Encounter: Payer: Self-pay | Admitting: Family Medicine

## 2019-10-15 DIAGNOSIS — Z8489 Family history of other specified conditions: Secondary | ICD-10-CM | POA: Diagnosis not present

## 2019-10-15 DIAGNOSIS — Z794 Long term (current) use of insulin: Secondary | ICD-10-CM | POA: Diagnosis not present

## 2019-10-15 DIAGNOSIS — E1142 Type 2 diabetes mellitus with diabetic polyneuropathy: Secondary | ICD-10-CM | POA: Diagnosis not present

## 2019-10-15 DIAGNOSIS — Z8673 Personal history of transient ischemic attack (TIA), and cerebral infarction without residual deficits: Secondary | ICD-10-CM | POA: Diagnosis not present

## 2019-10-17 ENCOUNTER — Other Ambulatory Visit: Payer: Self-pay | Admitting: Family Medicine

## 2019-10-19 NOTE — Telephone Encounter (Signed)
Please schedule visit for med check and then route back to nurses to send in one refill

## 2019-10-19 NOTE — Telephone Encounter (Signed)
Scheduled 3/30

## 2019-11-05 ENCOUNTER — Telehealth: Payer: Self-pay | Admitting: Family Medicine

## 2019-11-05 NOTE — Telephone Encounter (Signed)
Patient husband Jonny Ruiz was seen 3/24 and was told to get Covid test. Today test came back positive, Patient wants to know if she should stop taking care of her Dad during this time. She states has no symptoms but not wearing a mask nor is her husband wearing a mask. But they are in separate parts of the house and she is preparing all his food for him. He was told to quarantine for 10 days and she wants to know if she should too. Please advise

## 2019-11-05 NOTE — Telephone Encounter (Signed)
Patient notified and rescheduled her appointment for after her quarantine. Patient will get tested on day 7 - sooner if symptoms.

## 2019-11-05 NOTE — Telephone Encounter (Signed)
Ps has appt 30th will need to delay, and, get test earlier if any symptoms develop

## 2019-11-05 NOTE — Telephone Encounter (Signed)
YES!!! Quarantine. Yes test around day 7 of your 10 day warantine

## 2019-11-09 ENCOUNTER — Telehealth: Payer: Self-pay | Admitting: *Deleted

## 2019-11-09 NOTE — Telephone Encounter (Signed)
Provided patient with Covid 19 appointment number and text information.

## 2019-11-10 ENCOUNTER — Ambulatory Visit: Payer: BC Managed Care – PPO | Admitting: Family Medicine

## 2019-11-11 ENCOUNTER — Ambulatory Visit: Payer: BC Managed Care – PPO | Attending: Internal Medicine

## 2019-11-11 ENCOUNTER — Other Ambulatory Visit: Payer: Self-pay

## 2019-11-11 DIAGNOSIS — Z20822 Contact with and (suspected) exposure to covid-19: Secondary | ICD-10-CM

## 2019-11-12 LAB — NOVEL CORONAVIRUS, NAA: SARS-CoV-2, NAA: NOT DETECTED

## 2019-11-13 ENCOUNTER — Telehealth: Payer: Self-pay | Admitting: Family Medicine

## 2019-11-13 NOTE — Telephone Encounter (Signed)
Negative COVID results given. Patient results "NOT Detected." Caller expressed understanding. ° °

## 2019-11-24 ENCOUNTER — Ambulatory Visit (INDEPENDENT_AMBULATORY_CARE_PROVIDER_SITE_OTHER): Payer: BC Managed Care – PPO | Admitting: Family Medicine

## 2019-11-24 ENCOUNTER — Other Ambulatory Visit: Payer: Self-pay

## 2019-11-24 DIAGNOSIS — E1169 Type 2 diabetes mellitus with other specified complication: Secondary | ICD-10-CM | POA: Diagnosis not present

## 2019-11-24 DIAGNOSIS — Z79899 Other long term (current) drug therapy: Secondary | ICD-10-CM

## 2019-11-24 DIAGNOSIS — I10 Essential (primary) hypertension: Secondary | ICD-10-CM

## 2019-11-24 DIAGNOSIS — E785 Hyperlipidemia, unspecified: Secondary | ICD-10-CM

## 2019-11-24 MED ORDER — TRIAMTERENE-HCTZ 37.5-25 MG PO CAPS: 1.0000 | ORAL_CAPSULE | Freq: Every day | ORAL | 5 refills | Status: DC

## 2019-11-24 MED ORDER — ATORVASTATIN CALCIUM 10 MG PO TABS: ORAL_TABLET | ORAL | 5 refills | Status: DC

## 2019-11-24 NOTE — Progress Notes (Signed)
   Subjective:   audio only    Patient ID: [name] [name], female    DOB: 1956/01/07, 64 y.o.   MRN: [id number]  Hypertension This is a chronic problem. There are no compliance problems.   Pt states her father passed away last 12-10-22 and she has not been taking care of herself.   Pt Endocrinologist (Dr.Kerr) changed patients Lantus to 62 units at bedtime.  Virtual Visit via Video Note  I connected with [name] [name] on 11/24/19 at 10:00 AM EDT by a video enabled telemedicine application and verified that I am speaking with the correct person using two identifiers.  Location: Patient: home Provider: office   I discussed the limitations of evaluation and management by telemedicine and the availability of in person appointments. The patient expressed understanding and agreed to proceed.  History of Present Illness:    Observations/Objective:   Assessment and Plan:   Follow Up Instructions:    I discussed the assessment and treatment plan with the patient. The patient was provided an opportunity to ask questions and all were answered. The patient agreed with the plan and demonstrated an understanding of the instructions.   The patient was advised to call back or seek an in-person evaluation if the symptoms worsen or if the condition fails to improve as anticipated.  I provided 25 minutes of non-face-to-face time during this encounter.   bp running overall good  123 ov  Patient continues to take lipid medication regularly. No obvious side effects from it. Generally does not miss a dose. Prior blood work results are reviewed with patient. Patient continues to work on fat intake in diet  Blood pressure medicine and blood pressure levels reviewed today with patient. Compliant with blood pressure medicine. States does not miss a dose. No obvious side effects. Blood pressure generally good when checked elsewhere. Watching salt intake.  Hx of stroke, no new symp   Review of  Systems See above    Objective:   Physical Exam   Virtual     Assessment & Plan:  Impression acute grief.  Discussed  2.  Hypertension.  Apparent decent control discussed to maintain same meds  3.  Diabetes followed by specialist  4.  History of stroke no recurrence of symptoms  5.  Hyperlipidemia.  Blood work good control in the past, needs to reassess  Diet exercise discussed.  Medications refilled.  Follow-up in 6 months.

## 2019-12-18 DIAGNOSIS — H2513 Age-related nuclear cataract, bilateral: Secondary | ICD-10-CM | POA: Diagnosis not present

## 2019-12-18 DIAGNOSIS — Z794 Long term (current) use of insulin: Secondary | ICD-10-CM | POA: Diagnosis not present

## 2019-12-18 DIAGNOSIS — E113393 Type 2 diabetes mellitus with moderate nonproliferative diabetic retinopathy without macular edema, bilateral: Secondary | ICD-10-CM | POA: Diagnosis not present

## 2019-12-18 DIAGNOSIS — H2511 Age-related nuclear cataract, right eye: Secondary | ICD-10-CM | POA: Diagnosis not present

## 2020-01-13 DIAGNOSIS — Z8673 Personal history of transient ischemic attack (TIA), and cerebral infarction without residual deficits: Secondary | ICD-10-CM | POA: Diagnosis not present

## 2020-01-13 DIAGNOSIS — Z8489 Family history of other specified conditions: Secondary | ICD-10-CM | POA: Diagnosis not present

## 2020-01-13 DIAGNOSIS — Z794 Long term (current) use of insulin: Secondary | ICD-10-CM | POA: Diagnosis not present

## 2020-01-13 DIAGNOSIS — E1142 Type 2 diabetes mellitus with diabetic polyneuropathy: Secondary | ICD-10-CM | POA: Diagnosis not present

## 2020-01-14 DIAGNOSIS — H2511 Age-related nuclear cataract, right eye: Secondary | ICD-10-CM | POA: Diagnosis not present

## 2020-01-15 DIAGNOSIS — H2512 Age-related nuclear cataract, left eye: Secondary | ICD-10-CM | POA: Diagnosis not present

## 2020-01-28 DIAGNOSIS — H2512 Age-related nuclear cataract, left eye: Secondary | ICD-10-CM | POA: Diagnosis not present

## 2020-02-08 DIAGNOSIS — H35432 Paving stone degeneration of retina, left eye: Secondary | ICD-10-CM | POA: Diagnosis not present

## 2020-02-08 DIAGNOSIS — H35033 Hypertensive retinopathy, bilateral: Secondary | ICD-10-CM | POA: Diagnosis not present

## 2020-02-08 DIAGNOSIS — E113393 Type 2 diabetes mellitus with moderate nonproliferative diabetic retinopathy without macular edema, bilateral: Secondary | ICD-10-CM | POA: Diagnosis not present

## 2020-03-08 DIAGNOSIS — Z1231 Encounter for screening mammogram for malignant neoplasm of breast: Secondary | ICD-10-CM | POA: Diagnosis not present

## 2020-04-27 DIAGNOSIS — L700 Acne vulgaris: Secondary | ICD-10-CM | POA: Diagnosis not present

## 2020-04-27 DIAGNOSIS — L821 Other seborrheic keratosis: Secondary | ICD-10-CM | POA: Diagnosis not present

## 2020-04-29 DIAGNOSIS — Z8673 Personal history of transient ischemic attack (TIA), and cerebral infarction without residual deficits: Secondary | ICD-10-CM | POA: Diagnosis not present

## 2020-04-29 DIAGNOSIS — E1165 Type 2 diabetes mellitus with hyperglycemia: Secondary | ICD-10-CM | POA: Diagnosis not present

## 2020-04-29 DIAGNOSIS — Z794 Long term (current) use of insulin: Secondary | ICD-10-CM | POA: Diagnosis not present

## 2020-04-29 DIAGNOSIS — E1142 Type 2 diabetes mellitus with diabetic polyneuropathy: Secondary | ICD-10-CM | POA: Diagnosis not present

## 2020-05-10 ENCOUNTER — Ambulatory Visit (INDEPENDENT_AMBULATORY_CARE_PROVIDER_SITE_OTHER): Payer: BC Managed Care – PPO | Admitting: Podiatry

## 2020-05-10 ENCOUNTER — Ambulatory Visit (INDEPENDENT_AMBULATORY_CARE_PROVIDER_SITE_OTHER): Payer: BC Managed Care – PPO

## 2020-05-10 ENCOUNTER — Other Ambulatory Visit: Payer: Self-pay | Admitting: Podiatry

## 2020-05-10 ENCOUNTER — Other Ambulatory Visit: Payer: Self-pay

## 2020-05-10 VITALS — BP 136/81 | HR 62 | Temp 99.4°F

## 2020-05-10 DIAGNOSIS — L02619 Cutaneous abscess of unspecified foot: Secondary | ICD-10-CM

## 2020-05-10 DIAGNOSIS — L03115 Cellulitis of right lower limb: Secondary | ICD-10-CM | POA: Diagnosis not present

## 2020-05-10 DIAGNOSIS — L03119 Cellulitis of unspecified part of limb: Secondary | ICD-10-CM

## 2020-05-10 DIAGNOSIS — L97512 Non-pressure chronic ulcer of other part of right foot with fat layer exposed: Secondary | ICD-10-CM

## 2020-05-10 DIAGNOSIS — M86171 Other acute osteomyelitis, right ankle and foot: Secondary | ICD-10-CM

## 2020-05-10 MED ORDER — DOXYCYCLINE HYCLATE 100 MG PO TABS: 100.0000 mg | ORAL_TABLET | Freq: Two times a day (BID) | ORAL | 0 refills | Status: DC

## 2020-05-10 MED ORDER — SILVER SULFADIAZINE 1 % EX CREA: 1.0000 "application " | TOPICAL_CREAM | Freq: Every day | CUTANEOUS | 0 refills | Status: DC

## 2020-05-10 MED ORDER — CIPROFLOXACIN HCL 500 MG PO TABS: 500.0000 mg | ORAL_TABLET | Freq: Two times a day (BID) | ORAL | 0 refills | Status: DC

## 2020-05-12 DIAGNOSIS — L02619 Cutaneous abscess of unspecified foot: Secondary | ICD-10-CM | POA: Diagnosis not present

## 2020-05-12 DIAGNOSIS — L03119 Cellulitis of unspecified part of limb: Secondary | ICD-10-CM | POA: Diagnosis not present

## 2020-05-12 NOTE — Progress Notes (Signed)
Subjective: 64 year old female presents the office initially for the appointment was for a callus but she noticed the last couple days the wound is opened up an increase swelling and redness to the toe.  She said no recent treatment for this.  She states that this is ongoing the callus for some time that she has not been taking care of it.  She has neuropathy.  Has had a chronic callus of the toe for some time now.  Denies any systemic complaints such as fevers, chills, nausea, vomiting. No acute changes since last appointment, and no other complaints at this time.   Objective: AAO x3, NAD DP/PT pulses palpable bilaterally, CRT less than 3 seconds The distal aspect of right hallux is a granular wound with clear drainage expressed.  There is edema and erythema to the hallux and there is edema of the foot with minimal warmth.  There is no ascending cellulitis.  No fluctuation or crepitation.  No pain with calf compression, swelling, warmth, erythema        Assessment: Right hallux ulceration, cellulitis  Plan: -All treatment options discussed with the patient including all alternatives, risks, complications.  -X-rays obtained reviewed.  Along the medial margin of the distal phalanx there is a chronic erosion noted.  I compared his previous x-rays from several years ago which is noted then as well.  No soft tissue emphysema is identified. -She will be debrided the wound today lasting for 312 with scalpel to debride the wound down to healthy, viable tissue to remove nonviable tissue.  Silvadene dressing changes daily. -Prescribed doxycycline, ciprofloxacin. -Offloading surgical shoe which is dispensed -She has a mild temperature of 98.4.  Discussed to check her temperature daily.  There is any worsening signs or symptoms of infection she is to report directly to the emergency department. -Blood work ordered -Wound culture obtained  -Infortunately she is at risk of amputation which we  discussed.  -Patient encouraged to call the office with any questions, concerns, change in symptoms.   Vivi Barrack DPM

## 2020-05-13 LAB — CBC WITH DIFFERENTIAL/PLATELET
Basophils Absolute: 0.1 10*3/uL (ref 0.0–0.2)
Basos: 1 %
EOS (ABSOLUTE): 0.3 10*3/uL (ref 0.0–0.4)
Eos: 4 %
Hematocrit: 38.1 % (ref 34.0–46.6)
Hemoglobin: 12.5 g/dL (ref 11.1–15.9)
Immature Grans (Abs): 0 10*3/uL (ref 0.0–0.1)
Immature Granulocytes: 0 %
Lymphocytes Absolute: 1.7 10*3/uL (ref 0.7–3.1)
Lymphs: 23 %
MCH: 29.1 pg (ref 26.6–33.0)
MCHC: 32.8 g/dL (ref 31.5–35.7)
MCV: 89 fL (ref 79–97)
Monocytes Absolute: 0.6 10*3/uL (ref 0.1–0.9)
Monocytes: 8 %
Neutrophils Absolute: 4.7 10*3/uL (ref 1.4–7.0)
Neutrophils: 64 %
Platelets: 283 10*3/uL (ref 150–450)
RBC: 4.3 x10E6/uL (ref 3.77–5.28)
RDW: 14.7 % (ref 11.7–15.4)
WBC: 7.3 10*3/uL (ref 3.4–10.8)

## 2020-05-13 LAB — BASIC METABOLIC PANEL
BUN/Creatinine Ratio: 17 (ref 12–28)
BUN: 18 mg/dL (ref 8–27)
CO2: 24 mmol/L (ref 20–29)
Calcium: 9.3 mg/dL (ref 8.7–10.3)
Chloride: 98 mmol/L (ref 96–106)
Creatinine, Ser: 1.08 mg/dL — ABNORMAL HIGH (ref 0.57–1.00)
GFR calc Af Amer: 62 mL/min/{1.73_m2} (ref >59)
GFR calc non Af Amer: 54 mL/min/{1.73_m2} — ABNORMAL LOW (ref >59)
Glucose: 269 mg/dL — ABNORMAL HIGH (ref 65–99)
Potassium: 4.6 mmol/L (ref 3.5–5.2)
Sodium: 136 mmol/L (ref 134–144)

## 2020-05-13 LAB — WOUND CULTURE
MICRO NUMBER:: 11004779
SPECIMEN QUALITY:: ADEQUATE

## 2020-05-16 ENCOUNTER — Ambulatory Visit (INDEPENDENT_AMBULATORY_CARE_PROVIDER_SITE_OTHER): Payer: BC Managed Care – PPO | Admitting: Podiatry

## 2020-05-16 ENCOUNTER — Other Ambulatory Visit: Payer: Self-pay

## 2020-05-16 ENCOUNTER — Encounter: Payer: Self-pay | Admitting: Podiatry

## 2020-05-16 DIAGNOSIS — L02619 Cutaneous abscess of unspecified foot: Secondary | ICD-10-CM | POA: Diagnosis not present

## 2020-05-16 DIAGNOSIS — L97512 Non-pressure chronic ulcer of other part of right foot with fat layer exposed: Secondary | ICD-10-CM | POA: Diagnosis not present

## 2020-05-16 DIAGNOSIS — L03119 Cellulitis of unspecified part of limb: Secondary | ICD-10-CM | POA: Diagnosis not present

## 2020-05-18 ENCOUNTER — Other Ambulatory Visit: Payer: Self-pay | Admitting: Podiatry

## 2020-05-23 ENCOUNTER — Other Ambulatory Visit: Payer: Self-pay

## 2020-05-23 ENCOUNTER — Ambulatory Visit (INDEPENDENT_AMBULATORY_CARE_PROVIDER_SITE_OTHER): Payer: BC Managed Care – PPO | Admitting: Podiatry

## 2020-05-23 ENCOUNTER — Encounter: Payer: Self-pay | Admitting: Podiatry

## 2020-05-23 DIAGNOSIS — M2031 Hallux varus (acquired), right foot: Secondary | ICD-10-CM

## 2020-05-23 DIAGNOSIS — E1142 Type 2 diabetes mellitus with diabetic polyneuropathy: Secondary | ICD-10-CM | POA: Diagnosis not present

## 2020-05-23 DIAGNOSIS — L02619 Cutaneous abscess of unspecified foot: Secondary | ICD-10-CM

## 2020-05-23 DIAGNOSIS — Z794 Long term (current) use of insulin: Secondary | ICD-10-CM

## 2020-05-23 DIAGNOSIS — L97512 Non-pressure chronic ulcer of other part of right foot with fat layer exposed: Secondary | ICD-10-CM | POA: Diagnosis not present

## 2020-05-23 DIAGNOSIS — L03119 Cellulitis of unspecified part of limb: Secondary | ICD-10-CM

## 2020-05-23 MED ORDER — DOXYCYCLINE HYCLATE 100 MG PO TABS
100.0000 mg | ORAL_TABLET | Freq: Two times a day (BID) | ORAL | 0 refills | Status: DC
Start: 2020-05-23 — End: 2020-06-27

## 2020-05-23 NOTE — Progress Notes (Signed)
  Subjective:  Patient ID: [name] [name], female    DOB: 1956-02-20,  MRN: [id number]  Chief Complaint  Patient presents with  . Wound Check    PT stated that she has had some bleeding this morning (it is the first time this has happened)     64 y.o. female presents with the above complaint. History confirmed with patient.  Returns for follow-up of right hallux ulceration that Dr. Ardelle Anton has been caring for.  She taken 1 week of doxycycline and was due to be sent a refill but the refill was not received by the pharmacy.  Objective:  Physical Exam: warm, good capillary refill and normal DP and PT pulses.  Full-thickness ulceration to subcutaneous tissue on the right hallux tip with contracted hallux malleus deformity.  Hyperkeratosis around this with periwound erythema.  No cellulitis or purulence.  No signs of infection.  Granular fibrillar wound base.  Post debridement measurements are 1.0 x 1.0 x 0.2 cm Assessment:   1. Ulcerated, foot, right, with fat layer exposed (HCC)   2. Cellulitis and abscess of foot, except toes   3. Type 2 diabetes mellitus with diabetic polyneuropathy, with long-term current use of insulin (HCC)   4. Hallux malleus of right foot      Plan:  Patient was evaluated and treated and all questions answered.  Patient educated on diabetes. Discussed proper diabetic foot care and discussed risks and complications of disease. Educated patient in depth on reasons to return to the office immediately should he/she discover anything concerning or new on the feet. All questions answered. Discussed proper shoes as well.   -Dressing applied consisting of Iodosorb -Wound cleansed and debrided Procedure: Selective Debridement of Wound Rationale: Removal of devitalized tissue from the wound to promote healing.  Pre-Debridement Wound Measurements: 1.0 cm x 1.0 cm x 0.2 cm  Post-Debridement Wound Measurements: same as pre-debridement. Type of Debridement: sharp  selective Tissue Removed: Devitalized soft-tissue Dressing: Dry, sterile, compression dressing. Disposition: Patient tolerated procedure well. Patient to return in 1 week for follow-up.   Return in about 2 weeks (around 06/06/2020) for wound re-check with Dr Ardelle Anton.

## 2020-05-23 NOTE — Telephone Encounter (Signed)
Please Advise

## 2020-05-24 ENCOUNTER — Encounter: Payer: Self-pay | Admitting: Podiatry

## 2020-05-24 NOTE — Progress Notes (Signed)
Subjective: 64 year old female presents the office today for follow-up evaluation of cellulitis, ulceration at the distal aspect of the hallux.  She still antibiotics and tolerating this well.  The swelling the redness is much improved but ulceration still remains.  She denies any drainage other than some bloody drainage at times but no purulence. Denies any systemic complaints such as fevers, chills, nausea, vomiting. No acute changes since last appointment, and no other complaints at this time.   Objective: AAO x3, NAD DP/PT pulses palpable bilaterally, CRT less than 3 seconds At the distal aspect of the right hallux is a granular wound with hyperkeratotic periwound.  After debridement today the wound measures 1.2 x 1.2 x 0.2 cm.  There is no probing to bone, undermining or tunneling.  There is improved edema erythema.  The wound is full-thickness. Hallux malleus is noted. No pain with calf compression, swelling, warmth, erythema  Assessment: Ulceration with improving cellulitis; MRSA  Plan: -All treatment options discussed with the patient including all alternatives, risks, complications.  -Sharply debrided the wound utilizing the 312 with scalpel to any complications down to healthy, bleeding, granular tissue removed nonviable tissue.  Continue with daily dressing changes.  Finish course of antibiotics.  Remain in surgical shoe, elevation and offloading. -Reviewed blood work as well as wound culture. -Monitor for any clinical signs or symptoms of infection and directed to call the office immediately should any occur or go to the ER. -Patient encouraged to call the office with any questions, concerns, change in symptoms.

## 2020-05-30 ENCOUNTER — Other Ambulatory Visit: Payer: Self-pay | Admitting: *Deleted

## 2020-05-31 MED ORDER — TRIAMTERENE-HCTZ 37.5-25 MG PO CAPS: 1.0000 | ORAL_CAPSULE | Freq: Every day | ORAL | 0 refills | Status: DC

## 2020-05-31 MED ORDER — ATORVASTATIN CALCIUM 10 MG PO TABS: ORAL_TABLET | ORAL | 0 refills | Status: DC

## 2020-05-31 NOTE — Telephone Encounter (Signed)
Patient has schedule appointment for 11/15

## 2020-06-07 ENCOUNTER — Other Ambulatory Visit: Payer: Self-pay

## 2020-06-07 ENCOUNTER — Ambulatory Visit (INDEPENDENT_AMBULATORY_CARE_PROVIDER_SITE_OTHER): Payer: BC Managed Care – PPO | Admitting: Podiatry

## 2020-06-07 ENCOUNTER — Encounter: Payer: Self-pay | Admitting: Podiatry

## 2020-06-07 DIAGNOSIS — Z794 Long term (current) use of insulin: Secondary | ICD-10-CM | POA: Diagnosis not present

## 2020-06-07 DIAGNOSIS — L97512 Non-pressure chronic ulcer of other part of right foot with fat layer exposed: Secondary | ICD-10-CM | POA: Diagnosis not present

## 2020-06-07 DIAGNOSIS — E1142 Type 2 diabetes mellitus with diabetic polyneuropathy: Secondary | ICD-10-CM

## 2020-06-08 NOTE — Progress Notes (Signed)
Subjective: 64 year old female presents the office today for follow-up evaluation of cellulitis, ulceration at the distal aspect of the hallux.  She is finished with doxycycline.  She said that she is doing well.  She states the Iodosorb did help the toe and Dr. Lilian Kapur but this on last appointment she is been using antibiotic ointment.  Denies any drainage or pus or any swelling or redness.  She thought the toe was looking better in regards to the wound.  She still out of work currently. Denies any systemic complaints such as fevers, chills, nausea, vomiting. No acute changes since last appointment, and no other complaints at this time.   Objective: AAO x3, NAD DP/PT pulses palpable bilaterally, CRT less than 3 seconds At the distal aspect of the right hallux is a granular wound with hyperkeratotic periwound.  After debridement today the wound measures 0.5 x 0.5 x 0.1 cm.  There is no probing to bone, undermining or tunneling.  No significant surrounding erythema there is no ascending cellulitis.  There is no fluctuance or crepitation.  There is no malodor. Hallux malleus is noted. No pain with calf compression, swelling, warmth, erythema  Assessment: Ulceration with improving cellulitis; MRSA  Plan: -All treatment options discussed with the patient including all alternatives, risks, complications.  -Sharply debrided the wound utilizing the 312 with scalpel to any complications down to healthy, bleeding, granular tissue removed nonviable tissue.  Continue with daily dressing changes and will switch to Iodosorb. Remain in surgical boot , elevation and offloading. -Monitor for any clinical signs or symptoms of infection and directed to call the office immediately should any occur or go to the ER. -Patient encouraged to call the office with any questions, concerns, change in symptoms.   Return in about 1 week (around 06/14/2020).  Vivi Barrack DPM

## 2020-06-14 ENCOUNTER — Encounter: Payer: Self-pay | Admitting: Podiatry

## 2020-06-14 ENCOUNTER — Other Ambulatory Visit: Payer: Self-pay

## 2020-06-14 ENCOUNTER — Ambulatory Visit (INDEPENDENT_AMBULATORY_CARE_PROVIDER_SITE_OTHER): Payer: BC Managed Care – PPO | Admitting: Podiatry

## 2020-06-14 DIAGNOSIS — Z794 Long term (current) use of insulin: Secondary | ICD-10-CM | POA: Diagnosis not present

## 2020-06-14 DIAGNOSIS — L97512 Non-pressure chronic ulcer of other part of right foot with fat layer exposed: Secondary | ICD-10-CM | POA: Diagnosis not present

## 2020-06-14 DIAGNOSIS — E1142 Type 2 diabetes mellitus with diabetic polyneuropathy: Secondary | ICD-10-CM

## 2020-06-15 NOTE — Progress Notes (Signed)
Subjective: 64 year old female presents the office today for follow-up evaluation of cellulitis, ulceration at the distal aspect of the hallux.  She states that the wound is still present.  She is not sure if is any better patient asking for surgical shoe today if the boot is worn out.  Denies any drainage or pus any swelling or redness.  She has no other concerns today Denies any systemic complaints such as fevers, chills, nausea, vomiting. No acute changes since last appointment, and no other complaints at this time.   Objective: AAO x3, NAD DP/PT pulses palpable bilaterally, CRT less than 3 seconds At the distal aspect of the right hallux is a granular wound with hyperkeratotic periwound.  After debridement today the wound measures 0.8 x 0.7 x 0.1 cm.  There was a hyperkeratotic periwound prior to debridement.  There is no probing to bone, undermining or tunneling.  No significant surrounding erythema there is no ascending cellulitis.  There is no fluctuance or crepitation.  There is no malodor. Hallux malleus is noted. No pain with calf compression, swelling, warmth, erythema  Assessment: Ulceration with improving cellulitis; MRSA  Plan: -All treatment options discussed with the patient including all alternatives, risks, complications.  -Sharply debrided the wound utilizing the 312 with scalpel to any complications down to healthy, bleeding, granular tissue removed nonviable tissue and deeper laceration order for the wound to progress.  Wound was slightly larger today after debridement.  Discussed using Prisma dressing changes daily which I gave her today.  I switch her to a surgical shoe with offloading.  Encouraged elevation.  No follow-ups on file.  Vivi Barrack DPM .

## 2020-06-23 ENCOUNTER — Other Ambulatory Visit: Payer: Self-pay

## 2020-06-23 ENCOUNTER — Ambulatory Visit (INDEPENDENT_AMBULATORY_CARE_PROVIDER_SITE_OTHER): Payer: BC Managed Care – PPO | Admitting: Podiatry

## 2020-06-23 ENCOUNTER — Encounter: Payer: Self-pay | Admitting: Podiatry

## 2020-06-23 DIAGNOSIS — E1142 Type 2 diabetes mellitus with diabetic polyneuropathy: Secondary | ICD-10-CM

## 2020-06-23 DIAGNOSIS — L97512 Non-pressure chronic ulcer of other part of right foot with fat layer exposed: Secondary | ICD-10-CM | POA: Diagnosis not present

## 2020-06-23 DIAGNOSIS — Z794 Long term (current) use of insulin: Secondary | ICD-10-CM

## 2020-06-23 DIAGNOSIS — M722 Plantar fascial fibromatosis: Secondary | ICD-10-CM | POA: Diagnosis not present

## 2020-06-27 ENCOUNTER — Ambulatory Visit (INDEPENDENT_AMBULATORY_CARE_PROVIDER_SITE_OTHER): Payer: BC Managed Care – PPO | Admitting: Family Medicine

## 2020-06-27 ENCOUNTER — Encounter: Payer: Self-pay | Admitting: Family Medicine

## 2020-06-27 ENCOUNTER — Other Ambulatory Visit: Payer: Self-pay

## 2020-06-27 VITALS — BP 130/80 | HR 96 | Temp 95.8°F | Ht 62.0 in | Wt 169.6 lb

## 2020-06-27 DIAGNOSIS — E1165 Type 2 diabetes mellitus with hyperglycemia: Secondary | ICD-10-CM | POA: Diagnosis not present

## 2020-06-27 DIAGNOSIS — Z8739 Personal history of other diseases of the musculoskeletal system and connective tissue: Secondary | ICD-10-CM | POA: Diagnosis not present

## 2020-06-27 DIAGNOSIS — Z794 Long term (current) use of insulin: Secondary | ICD-10-CM | POA: Diagnosis not present

## 2020-06-27 DIAGNOSIS — E1142 Type 2 diabetes mellitus with diabetic polyneuropathy: Secondary | ICD-10-CM

## 2020-06-27 DIAGNOSIS — R350 Frequency of micturition: Secondary | ICD-10-CM

## 2020-06-27 DIAGNOSIS — Z23 Encounter for immunization: Secondary | ICD-10-CM | POA: Diagnosis not present

## 2020-06-27 DIAGNOSIS — I10 Essential (primary) hypertension: Secondary | ICD-10-CM

## 2020-06-27 DIAGNOSIS — E7801 Familial hypercholesterolemia: Secondary | ICD-10-CM

## 2020-06-27 DIAGNOSIS — R011 Cardiac murmur, unspecified: Secondary | ICD-10-CM

## 2020-06-27 LAB — POCT URINALYSIS DIPSTICK
Glucose, UA: POSITIVE — AB
Spec Grav, UA: 1.015 (ref 1.010–1.025)
pH, UA: 6 (ref 5.0–8.0)

## 2020-06-27 LAB — POCT GLUCOSE (DEVICE FOR HOME USE): Glucose Fasting, POC: 380 mg/dL — AB (ref 70–99)

## 2020-06-27 NOTE — Progress Notes (Signed)
Patient ID: [name] [name], female    DOB: 1956/06/29, 64 y.o.   MRN: [id number]   Chief Complaint  Patient presents with  . Hypertension  . Hyperlipidemia  . Diabetes  . Urinary Frequency   Subjective:    HPI  Pt here for follow up htn/DM2.   Pt is seeing Dr.Wagoner [facility]. Pt wold like to speak with provider about urine issues. Pt states she has to wear a pad constantly. When pt gets up at night (2-3 times per night/ drinking water all through the night) pt is unable to hold urine. Has been going on for a while.  Pt had blood work done through work a couple weeks ago.   DM2- taking lantus 52 units qhs. At times you'll go up to 62 units at night. Had glucose 269, not sure if fasting. Seeing Dr. Sharl Ma, endo. Seeing every 3 months. Hasn't started it yet, trulicity. Was prescribed this in 9/21.  Staying with her mother and having renovations at [employer]. Eating late at night.  Pt ate today- ate something high carb, nabs/diet drink. Ate that at 7:30am. No other drinks after. Drinks diet drinks and drinks coffee and unsweetened creamer, no sugar.  Has rt foot brace- has an ulcer on rt great toe. Pt stating broke it 2-3 times. Seeing podiatry seeing Dr. Ardelle Anton. Was on antibiotics in past for it.  Has heart murmur, has been told about it. Had u/s in past on heart. Had echo 2019.  Aortic valve with mild stenosis.  Pt didn't pick up the new bp meds and didn't take in 2-3 wks then had a stroke.  Pt denies dysuria, back pain, hematuria.  Has noticed more frequency at night. No fever or lower abd pain. Pt stating drinking about 6 oz water at night before bed.  Was told by her endo to drink more water.    Medical History Tayona has a past medical history of Hypertension, Microproteinuria, Neuropathy, Noncompliance, Osteopenia, Stroke (HCC), Type 2 diabetes mellitus (HCC), and Vertigo.   Outpatient Encounter Medications as of 06/27/2020  Medication Sig    . atorvastatin (LIPITOR) 10 MG tablet TAKE 1 TABLET BY MOUTH ONCE DAILY AT 6PM  . Clindamycin-Benzoyl Per, Refr, gel Apply topically at bedtime.  . Cyanocobalamin (B-12 PO) Take 2 tablets by mouth daily.   . DUREZOL 0.05 % EMUL Place 1 drop into the right eye 3 (three) times daily.  . Insulin Glargine (LANTUS SOLOSTAR) 100 UNIT/ML Solostar Pen Inject 52 Units into the skin at bedtime.   . Multiple Vitamins-Minerals (ONE-A-DAY WOMENS VITACRAVES) CHEW Chew 1 tablet by mouth daily.  Marland Kitchen PROLENSA 0.07 % SOLN SMARTSIG:1 Drop(s) Left Eye Every Evening  . silver sulfADIAZINE (SILVADENE) 1 % cream Apply 1 application topically daily.  Marland Kitchen triamterene-hydrochlorothiazide (DYAZIDE) 37.5-25 MG capsule Take 1 each (1 capsule total) by mouth daily.  . TRULICITY 0.75 MG/0.5ML SOPN Inject 0.75 mg into the skin once a week. (Patient not taking: Reported on 06/27/2020)  . [DISCONTINUED] ciprofloxacin (CIPRO) 500 MG tablet Take 1 tablet (500 mg total) by mouth 2 (two) times daily.  . [DISCONTINUED] doxycycline (VIBRA-TABS) 100 MG tablet Take 1 tablet (100 mg total) by mouth 2 (two) times daily.   No facility-administered encounter medications on file as of 06/27/2020.     Review of Systems  Constitutional: Negative for chills and fever.  HENT: Negative for congestion, rhinorrhea and sore throat.   Respiratory: Negative for cough, shortness of breath and wheezing.  Cardiovascular: Negative for chest pain and leg swelling.  Gastrointestinal: Negative for abdominal pain, diarrhea, nausea and vomiting.  Endocrine: Positive for polyuria. Negative for polydipsia and polyphagia.  Genitourinary: Positive for frequency. Negative for decreased urine volume, difficulty urinating, dysuria, flank pain, hematuria and urgency.  Musculoskeletal: Negative for arthralgias and back pain.  Skin: Negative for rash.  Neurological: Negative for dizziness, weakness and headaches.     Vitals BP 130/80   Pulse 96   Temp (!)  95.8 F (35.4 C)   Ht 5\' 2"  (1.575 m)   Wt 169 lb 9.6 oz (76.9 kg)   SpO2 98%   BMI 31.02 kg/m   Objective:   Physical Exam Vitals and nursing note reviewed.  Constitutional:      Appearance: Normal appearance.  HENT:     Head: Normocephalic and atraumatic.     Nose: Nose normal.     Mouth/Throat:     Mouth: Mucous membranes are moist.     Pharynx: Oropharynx is clear.  Eyes:     Extraocular Movements: Extraocular movements intact.     Conjunctiva/sclera: Conjunctivae normal.     Pupils: Pupils are equal, round, and reactive to light.  Cardiovascular:     Rate and Rhythm: Normal rate and regular rhythm.     Pulses: Normal pulses.     Heart sounds: Murmur (systolic best heart at Lsb 4/6) heard.   Pulmonary:     Effort: Pulmonary effort is normal.     Breath sounds: Normal breath sounds. No wheezing, rhonchi or rales.  Musculoskeletal:        General: No tenderness. Normal range of motion.     Right lower leg: No edema.     Left lower leg: No edema.     Comments: + post op shoe on rt foot, due to non healing ulcer on rt great toe.  Skin:    General: Skin is warm and dry.     Findings: No lesion or rash.  Neurological:     General: No focal deficit present.     Mental Status: She is alert and oriented to person, place, and time.     Cranial Nerves: No cranial nerve deficit.  Psychiatric:        Mood and Affect: Mood normal.        Behavior: Behavior normal.        Thought Content: Thought content normal.        Judgment: Judgment normal.      Assessment and Plan   1. Type 2 diabetes mellitus with diabetic polyneuropathy, with long-term current use of insulin (HCC) - POCT Glucose (Device for Home Use) - POCT Urinalysis Dipstick  2. Urine frequency - POCT Glucose (Device for Home Use) - POCT Urinalysis Dipstick  3. Need for vaccination - Flu Vaccine QUAD High Dose(Fluad)  4. Type 2 diabetes mellitus with hyperglycemia, with long-term current use of insulin  (HCC)  5. History of osteomyelitis  6. Essential hypertension  7. Familial hypercholesterolemia  8. Heart murmur, systolic   HM- Pt wanting flu vaccine. Pt needing refill on triamterene/hctz, after seeing labs.  DM2- uncontrolled, pt has glucose this am of 380. Pt stating she will start the trulicity today.  Advised her to increase her lantus over next few days and cont to check bg 2x per day and call if seeing high numbers over 300s still.   start taking lantus 62 units at night if seeing bg over 200 at bedtime. Pt to call 6/6  or endo if seeing numbers over 300s still.  Cont f/u with endo.  Cont f/u podiatry for non healing ulcer rt great toe vs. Osteomyelitis of the rt foot. Reviewed the need to get glucose under control to help with healing and to avoid having to go to hospital for [facility] or DKA. Pt voiced understanding. Call if feeling lethargic, high glucose numbers, or any other infections.  Urinary frequency - likely related to hyperglycemia.  Advising to control blood glucose to help with frequency.  ua -neg for infection.  Has high glucose and neg ketones.  htn- stable cont meds. hld- recheck labs on next visit, cont meds.   Results for orders placed or performed in visit on 06/27/20  POCT Glucose (Device for Home Use)  Result Value Ref Range   Glucose Fasting, POC 380 (A) 70 - 99 mg/dL   POC Glucose    POCT Urinalysis Dipstick  Result Value Ref Range   Color, UA     Clarity, UA     Glucose, UA Positive (A) Negative   Bilirubin, UA     Ketones, UA     Spec Grav, UA 1.015 1.010 - 1.025   Blood, UA     pH, UA 6.0 5.0 - 8.0   Protein, UA     Urobilinogen, UA     Nitrite, UA     Leukocytes, UA     Appearance     Odor     F/u 19mo or prn.  Pt to see endo in 1/22 as scheduled.   Counseling time- spent over with reviewing chart, labs, documentation, discussion and coordination of care and reviewing diabetes and treatment plan.

## 2020-06-27 NOTE — Patient Instructions (Addendum)
Drink water today to work on decreasing glucose.   START TODAY- Take trulicity or call endo for new medication.  Check blood glucose 2 x per day, call if seeing numbers over 300.  Continue lantus nightly. Take 62 units lantus tonight, if seeing over 200 blood glucose.  Call endo or Dr. Ladona Ridgel if having high blood glucose over 300s or if having any other concerns.

## 2020-06-28 NOTE — Progress Notes (Signed)
Subjective: 64 year old female presents the office today for follow-up evaluation of cellulitis, ulceration at the distal aspect of the hallux.  She states the wound is still present.  She states that she cleans it daily and applies a dressing.  No increase in swelling or redness and denies any drainage or pus.  She has no pain in the right big toe.  She states that she really started getting some discomfort in the bottom of her left heel but denies any recent injury or trauma or any factors that started this.  She said no recent treatment.  No swelling.  She has no other concerns today.  No other open lesions that she reports. Denies any systemic complaints such as fevers, chills, nausea, vomiting. No acute changes since last appointment, and no other complaints at this time.   Objective: AAO x3, NAD DP/PT pulses palpable bilaterally, CRT less than 3 seconds At the distal aspect of the right hallux is a granular wound with hyperkeratotic periwound.  After debridement today the wound measures 0.6 x 0.5 x 0.1 cm.  There is no surrounding erythema, ascending cellulitis peer there is no probing to bone, undermining or tunneling.  There is no fluctuation or crepitation.  There is no malodor. Minimal discomfort at the plantar aspect left heel.  This on the insertion of plantar fashion of the calcaneus but there is no pain with lateral compression of calcaneus.  No pain with Achilles tendon.  No edema, erythema. Hallux malleus is noted. No pain with calf compression, swelling, warmth, erythema  Assessment: Ulceration with improving cellulitis; MRSA  Plan: -All treatment options discussed with the patient including all alternatives, risks, complications.  -Sharply debrided the wound utilizing the 312 with scalpel to any complications down to healthy, bleeding, granular tissue removed nonviable tissue and devitalized tissue in order for the wound to progress.  Continue with using Prisma dressing changes  daily which I gave her today.  I switch her to a surgical shoe with offloading.  Encouraged elevation. -On the dorsal left foot he does complain of compensation given the right side.  Discussed stretching, icing daily.  Heel pad.Vivi Barrack DPM .

## 2020-07-05 ENCOUNTER — Other Ambulatory Visit: Payer: Self-pay

## 2020-07-05 ENCOUNTER — Ambulatory Visit (INDEPENDENT_AMBULATORY_CARE_PROVIDER_SITE_OTHER): Payer: BC Managed Care – PPO | Admitting: Podiatry

## 2020-07-05 ENCOUNTER — Encounter: Payer: Self-pay | Admitting: Podiatry

## 2020-07-05 DIAGNOSIS — Z794 Long term (current) use of insulin: Secondary | ICD-10-CM | POA: Diagnosis not present

## 2020-07-05 DIAGNOSIS — E1142 Type 2 diabetes mellitus with diabetic polyneuropathy: Secondary | ICD-10-CM | POA: Diagnosis not present

## 2020-07-05 DIAGNOSIS — L97512 Non-pressure chronic ulcer of other part of right foot with fat layer exposed: Secondary | ICD-10-CM

## 2020-07-06 ENCOUNTER — Other Ambulatory Visit: Payer: Self-pay | Admitting: Family Medicine

## 2020-07-11 NOTE — Progress Notes (Signed)
Subjective: 64 year old female presents the office today for follow-up evaluation of ulceration of the distal aspect of the right hallux.  She states that since she ran out of the Prisma she has not been putting anything other than a bandage on the wound.  Denies any drainage or pus or any swelling or redness.  No pain.  She is on the offloading shoe. Denies any systemic complaints such as fevers, chills, nausea, vomiting. No acute changes since last appointment, and no other complaints at this time.   Objective: AAO x3, NAD DP/PT pulses palpable bilaterally, CRT less than 3 seconds At the distal aspect of the right hallux is a granular wound with hyperkeratotic periwound.  After debridement today the wound measures 0.5 x 0.5 x 0.1 cm.  There is no surrounding erythema, ascending cellulitis and there is no probing to bone, undermining or tunneling.  There is no fluctuation or crepitation.  No malodor. Hallux malleus is noted. No pain with calf compression, swelling, warmth, erythema  Assessment: Ulceration with improving cellulitis; MRSA  Plan: -All treatment options discussed with the patient including all alternatives, risks, complications.  -Sharply debridement the ulcer utilizing a #312 with scalpel down to healthy, bleeding, viable tissue to remove nonviable devitalized tissue to promote wound healing.  We will switch back to using Betadine wound daily which I also dispensed for her today.  Continue offloading.  Elevation. -Monitor for any clinical signs or symptoms of infection and directed to call the office immediately should any occur or go to the ER.  Vivi Barrack DPM

## 2020-07-12 ENCOUNTER — Other Ambulatory Visit: Payer: Self-pay

## 2020-07-12 ENCOUNTER — Ambulatory Visit (INDEPENDENT_AMBULATORY_CARE_PROVIDER_SITE_OTHER): Payer: BC Managed Care – PPO | Admitting: Podiatry

## 2020-07-12 ENCOUNTER — Encounter: Payer: Self-pay | Admitting: Podiatry

## 2020-07-12 DIAGNOSIS — L97512 Non-pressure chronic ulcer of other part of right foot with fat layer exposed: Secondary | ICD-10-CM | POA: Diagnosis not present

## 2020-07-12 DIAGNOSIS — Z794 Long term (current) use of insulin: Secondary | ICD-10-CM | POA: Diagnosis not present

## 2020-07-12 DIAGNOSIS — E1142 Type 2 diabetes mellitus with diabetic polyneuropathy: Secondary | ICD-10-CM

## 2020-07-12 NOTE — Progress Notes (Signed)
Subjective: 64 year old female presents the office today for follow-up evaluation of ulceration of the distal aspect of the right hallux. She is been continue Betadine on the wound daily. Denies any drainage or pus or any swelling. The wound is healing although very slowly. She does admit that she has been on her feet quite a bit as she takes care of her mom. Denies any systemic complaints such as fevers, chills, nausea, vomiting. No acute changes since last appointment, and no other complaints at this time.   Objective: AAO x3, NAD DP/PT pulses palpable bilaterally, CRT less than 3 seconds At the distal aspect of the right hallux is a granular wound with hyperkeratotic periwound.  After debridement today the wound measures 0.3 x 0.3 x 0.1 cm.  There is no surrounding erythema, ascending cellulitis and there is no probing to bone, undermining or tunneling.  There is no fluctuation or crepitation.  No malodor. Hallux malleus is noted. No pain with calf compression, swelling, warmth, erythema  Assessment: Ulceration with improving cellulitis; MRSA  Plan: -All treatment options discussed with the patient including all alternatives, risks, complications.  -Sharply debridement the ulcer utilizing a #312 with scalpel down to healthy, bleeding, viable tissue to remove nonviable devitalized tissue to promote wound healing. We will continue to using Betadine wound daily.  Continue offloading.  Elevation. -Monitor for any clinical signs or symptoms of infection and directed to call the office immediately should any occur or go to the ER.  Vivi Barrack DPM

## 2020-07-19 ENCOUNTER — Other Ambulatory Visit: Payer: Self-pay

## 2020-07-19 ENCOUNTER — Ambulatory Visit (INDEPENDENT_AMBULATORY_CARE_PROVIDER_SITE_OTHER): Payer: BC Managed Care – PPO | Admitting: Podiatry

## 2020-07-19 ENCOUNTER — Encounter: Payer: Self-pay | Admitting: Podiatry

## 2020-07-19 DIAGNOSIS — E1142 Type 2 diabetes mellitus with diabetic polyneuropathy: Secondary | ICD-10-CM

## 2020-07-19 DIAGNOSIS — L97512 Non-pressure chronic ulcer of other part of right foot with fat layer exposed: Secondary | ICD-10-CM

## 2020-07-19 DIAGNOSIS — M2031 Hallux varus (acquired), right foot: Secondary | ICD-10-CM

## 2020-07-19 DIAGNOSIS — Z794 Long term (current) use of insulin: Secondary | ICD-10-CM | POA: Diagnosis not present

## 2020-07-25 NOTE — Progress Notes (Signed)
Subjective: 65 year old female presents the office today for follow-up evaluation of ulceration of the distal aspect of the right hallux.  She states that the toe is slowly getting better but she is ready for the wound to be healed.  She currently denies any drainage or pus or any swelling or redness.  She denies any fevers, chills, nausea, vomiting.  No calf pain, chest pain, shortness of breath.   Objective: AAO x3, NAD DP/PT pulses palpable bilaterally, CRT less than 3 seconds At the distal aspect of the right hallux is a granular wound with hyperkeratotic periwound.  After debridement today the wound measures 0.3 x 0.2 x 0.1 cm.  There is no surrounding erythema, ascending cellulitis and there is no probing to bone, undermining or tunneling.  There is no fluctuation or crepitation.  No malodor. Hallux malleus is noted. No pain with calf compression, swelling, warmth, erythema  Assessment: Ulceration with improving cellulitis; MRSA  Plan: -All treatment options discussed with the patient including all alternatives, risks, complications.  -Sharply debridement the ulcer utilizing a #312 with scalpel down to healthy, bleeding, viable tissue to remove nonviable devitalized tissue to promote wound healing.  Continue daily dressing changes. -Unfortunately the wound is slow to heal given the neuropathy, diabetes of the hallux malleus putting pressure on the distal aspect.  Can consider surgical intervention for the hallux malleus if needed. -Monitor for any clinical signs or symptoms of infection and directed to call the office immediately should any occur or go to the ER.  Vivi Barrack DPM

## 2020-07-26 ENCOUNTER — Other Ambulatory Visit: Payer: Self-pay

## 2020-07-26 ENCOUNTER — Ambulatory Visit (INDEPENDENT_AMBULATORY_CARE_PROVIDER_SITE_OTHER): Payer: BC Managed Care – PPO | Admitting: Podiatry

## 2020-07-26 ENCOUNTER — Encounter: Payer: Self-pay | Admitting: Podiatry

## 2020-07-26 DIAGNOSIS — Z794 Long term (current) use of insulin: Secondary | ICD-10-CM | POA: Diagnosis not present

## 2020-07-26 DIAGNOSIS — L97512 Non-pressure chronic ulcer of other part of right foot with fat layer exposed: Secondary | ICD-10-CM

## 2020-07-26 DIAGNOSIS — E1142 Type 2 diabetes mellitus with diabetic polyneuropathy: Secondary | ICD-10-CM

## 2020-07-28 NOTE — Progress Notes (Signed)
Subjective: 64 year old female presents the office today for follow-up evaluation of ulceration of the distal aspect of the right hallux.  She states the toe is doing much better look at the wound is healed however she was walking barefoot and the bandage got caught on her floor and she hit her toe causing some bleeding and the wound opened back up some.  Denies any increase in swelling or redness.  No active drainage or purulence.  Denies any fevers, chills, nausea or vomiting.  No calf pain, chest pain, shortness of breath.   Objective: AAO x3, NAD DP/PT pulses palpable bilaterally, CRT less than 3 seconds At the distal aspect of the right hallux is a granular wound with hyperkeratotic periwound.  The wound is almost completely healed almost a pinhole type lesion.  There is no active drainage or pus and there is no edema, erythema and signs of infection.  There is no probing, undermining or tunneling. No pain with calf compression, swelling, warmth, erythema  Assessment: Ulceration with improving cellulitis; MRSA  Plan: -All treatment options discussed with the patient including all alternatives, risks, complications.  -Sharply debridement the ulcer utilizing a #312 with scalpel down to healthy, bleeding, viable tissue to remove nonviable devitalized tissue to promote wound healing.  Continue daily dressing changes. -Continue offloading.  We will continue to stay to work hopefully return with regular holidays.  Will further evaluate next appointment. -Monitor for any clinical signs or symptoms of infection and directed to call the office immediately should any occur or go to the ER.  Vivi Barrack DPM

## 2020-08-04 ENCOUNTER — Ambulatory Visit (INDEPENDENT_AMBULATORY_CARE_PROVIDER_SITE_OTHER): Payer: BC Managed Care – PPO

## 2020-08-04 ENCOUNTER — Encounter: Payer: Self-pay | Admitting: Podiatry

## 2020-08-04 ENCOUNTER — Other Ambulatory Visit: Payer: Self-pay

## 2020-08-04 ENCOUNTER — Ambulatory Visit (INDEPENDENT_AMBULATORY_CARE_PROVIDER_SITE_OTHER): Payer: BC Managed Care – PPO | Admitting: Podiatry

## 2020-08-04 DIAGNOSIS — E1142 Type 2 diabetes mellitus with diabetic polyneuropathy: Secondary | ICD-10-CM | POA: Diagnosis not present

## 2020-08-04 DIAGNOSIS — L97512 Non-pressure chronic ulcer of other part of right foot with fat layer exposed: Secondary | ICD-10-CM

## 2020-08-04 DIAGNOSIS — Z794 Long term (current) use of insulin: Secondary | ICD-10-CM

## 2020-08-04 DIAGNOSIS — L02611 Cutaneous abscess of right foot: Secondary | ICD-10-CM

## 2020-08-04 MED ORDER — AMOXICILLIN-POT CLAVULANATE 875-125 MG PO TABS: 1.0000 | ORAL_TABLET | Freq: Two times a day (BID) | ORAL | 0 refills | Status: DC

## 2020-08-07 LAB — WOUND CULTURE
MICRO NUMBER:: 11353458
SPECIMEN QUALITY:: ADEQUATE

## 2020-08-10 NOTE — Progress Notes (Signed)
Subjective: 64 year old female presents the office today for follow-up evaluation of ulceration of the distal aspect of the right hallux.  She has a new issue today she has developed an area on the bottom of her right foot pointing submetatarsal 5.  1 thing she can think of is that there was a broken glass that she may have stepped on a piece and she noticed a bruise, not to the area.  This has been all over the last couple of days.  Currently denies any fevers, chills, nausea, vomiting.  No calf pain, chest pain, shortness of breath.    Objective: AAO x3, NAD DP/PT pulses palpable bilaterally, CRT less than 3 seconds At the distal aspect of the right hallux is a granular wound with hyperkeratotic periwound.  The wound is superficial and starting to scab.  There is no edema, erythema or signs of infection.  However submetatarsal 5 is a hyperkeratotic tissue with evidence of blister, hemorrhagic blister present.  Upon debridement purulence was identified.  There is mild surrounding erythema without any ascending cellulitis.  After debridement there is no further fluctuation.  There is no crepitation.  Not able to appreciate any foreign body or puncture wound. No pain with calf compression, swelling, warmth, erythema  Assessment: Ulceration with improving cellulitis; new ulceration submetatarsal 5  Plan: -All treatment options discussed with the patient including all alternatives, risks, complications.  -X-rays obtained reviewed.  No evidence of foreign body.  No evidence of osteolysis of the fifth metatarsal.  Chronic changes present the hallux. -Sharply debridement the ulcer on the hallux utilizing a #312 with scalpel down to healthy, bleeding, viable tissue to remove nonviable devitalized tissue to promote wound healing.  Continue daily dressing changes. -On the wound to the right submetatarsal 5 shallow debrided the wound to reveal underlying abscess which was drained today and the wound culture  was obtained.  Area was irrigated and cleaned.  The wound did not go deep and appeared superficial,a superficial abscess.  Augmentin.  Offloading. -Monitor for any clinical signs or symptoms of infection and directed to call the office immediately should any occur or go to the ER.  Return in about 1 week (around 08/11/2020).  Vivi Barrack DPM

## 2020-08-11 ENCOUNTER — Other Ambulatory Visit: Payer: Self-pay | Admitting: Family Medicine

## 2020-08-11 ENCOUNTER — Encounter: Payer: Self-pay | Admitting: Podiatry

## 2020-08-11 ENCOUNTER — Other Ambulatory Visit: Payer: Self-pay

## 2020-08-11 ENCOUNTER — Ambulatory Visit (INDEPENDENT_AMBULATORY_CARE_PROVIDER_SITE_OTHER): Payer: BC Managed Care – PPO | Admitting: Podiatry

## 2020-08-11 VITALS — BP 147/83 | HR 66 | Temp 98.3°F

## 2020-08-11 DIAGNOSIS — L02611 Cutaneous abscess of right foot: Secondary | ICD-10-CM

## 2020-08-11 DIAGNOSIS — E1142 Type 2 diabetes mellitus with diabetic polyneuropathy: Secondary | ICD-10-CM | POA: Diagnosis not present

## 2020-08-11 DIAGNOSIS — L97512 Non-pressure chronic ulcer of other part of right foot with fat layer exposed: Secondary | ICD-10-CM | POA: Diagnosis not present

## 2020-08-11 DIAGNOSIS — Z794 Long term (current) use of insulin: Secondary | ICD-10-CM | POA: Diagnosis not present

## 2020-08-16 NOTE — Progress Notes (Signed)
Subjective: 65 year old female presents the office today for follow-up evaluation of ulceration of the right hallux as well as ulcer, abscess to the right submetatarsal 5.  She states that she has been on antibiotics since she has been doing better.  She does the wounds are healing although slowly.  Denies any increase in swelling or redness or any drainage at this time.  She does admit that she is on her feet quite a bit recently still. Currently denies any fevers, chills, nausea, vomiting.  No calf pain, chest pain, shortness of breath.    Objective: AAO x3, NAD DP/PT pulses palpable bilaterally, CRT less than 3 seconds At the distal aspect of the right hallux is a granular wound with hyperkeratotic periwound.  Wound is superficial and dry.  Somewhat smaller today and there is no probing to bone, undermining or tunneling.  No edema, erythema, ascending cellulitis.  No signs of infection.  Along the area of the submetatarsal 5 the wound there is hyperkeratotic tissue and appears at the wound has resolved and healed.  There is callus formation but no skin breakdown at this time.  No edema, erythema.  No ascending cellulitis. No pain with calf compression, swelling, warmth, erythema  Assessment: Ulceration hallux with resolved cellulitis; resolving ulceration submetatarsal 5  Plan: -All treatment options discussed with the patient including all alternatives, risks, complications.  -Debrided the wound of the hallux today without any complications utilizing #312 with scalpel to debride nonviable hyperkeratotic tissue down to healthy, granular tissue to promote wound healing.  She tolerated well.  Continue with daily dressing changes and offloading. -Wound submetatarsal 5 is healed.  Watch for any reoccurrence.  Continue offloading.  Reviewed the wound culture today.  Infection appears resolved we will hold off any further antibiotics. -Monitor for any clinical signs or symptoms of infection and directed  to call the office immediately should any occur or go to the ER.  Return in about 1 week (around 08/18/2020).  Vivi Barrack DPM

## 2020-08-18 ENCOUNTER — Other Ambulatory Visit: Payer: Self-pay

## 2020-08-18 ENCOUNTER — Encounter: Payer: Self-pay | Admitting: Podiatry

## 2020-08-18 ENCOUNTER — Ambulatory Visit (INDEPENDENT_AMBULATORY_CARE_PROVIDER_SITE_OTHER): Payer: BC Managed Care – PPO | Admitting: Podiatry

## 2020-08-18 DIAGNOSIS — L97512 Non-pressure chronic ulcer of other part of right foot with fat layer exposed: Secondary | ICD-10-CM

## 2020-08-18 DIAGNOSIS — M2031 Hallux varus (acquired), right foot: Secondary | ICD-10-CM | POA: Diagnosis not present

## 2020-08-18 DIAGNOSIS — E1142 Type 2 diabetes mellitus with diabetic polyneuropathy: Secondary | ICD-10-CM

## 2020-08-18 DIAGNOSIS — Z794 Long term (current) use of insulin: Secondary | ICD-10-CM

## 2020-08-25 NOTE — Progress Notes (Signed)
Subjective: 65 year old female presents the office today for follow-up evaluation of ulceration of the right hallux as well as ulcer, abscess to the right submetatarsal 5.  She states that overall her foot is doing better but still present on the big toe.  Submetatarsal 5 area is healed.  She denies any drainage or pus and denies any swelling or redness.  Her last A1c was 9.3.  She has not checked her blood sugar recently.  Currently denies any fevers, chills, nausea, vomiting.  No calf pain, chest pain, shortness of breath.    Objective: AAO x3, NAD DP/PT pulses palpable bilaterally, CRT less than 3 seconds Right foot submetatarsal 5 hyperkeratotic lesion.  Upon debridement there is no underlying ulceration, drainage or any signs of infection today.  At the distal aspect of the right hallux hyperkeratotic lesion with central granular wound.  Today measures 0.3 x 0.3 x 0.1 cm without any probing to bone, and minor tunneling.  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 No pain with calf compression, swelling, warmth, erythema  Assessment: Ulceration hallux with resolved cellulitis; resolving ulceration submetatarsal 5  Plan: -All treatment options discussed with the patient including all alternatives, risks, complications.  -Sharply debrided the wound of the hallux today without any complications utilizing #312 with scalpel to debride nonviable hyperkeratotic tissue down to healthy, granular tissue to promote wound healing.  She tolerated well.  She is to continue daily dressing changes and offloading.  Unfortunate this is likely due to increased blood sugar as well as the hallux malleus putting pressure and neuropathy.  Consider arthroplasty possible fusion IPJ to help facilitate healing.  However with increased A1c and blood sugar hesitant to do surgery. -Wound submetatarsal 5 is healed.  Moisturizer daily.  Continue offloading.   -Monitor for any clinical signs or symptoms of infection and directed to call the  office immediately should any occur or go to the ER.  Return in about 1 week   Vivi Barrack DPM

## 2020-08-26 ENCOUNTER — Other Ambulatory Visit: Payer: Self-pay

## 2020-08-26 ENCOUNTER — Encounter: Payer: Self-pay | Admitting: Podiatry

## 2020-08-26 ENCOUNTER — Ambulatory Visit (INDEPENDENT_AMBULATORY_CARE_PROVIDER_SITE_OTHER): Payer: BC Managed Care – PPO | Admitting: Podiatry

## 2020-08-26 DIAGNOSIS — Z794 Long term (current) use of insulin: Secondary | ICD-10-CM

## 2020-08-26 DIAGNOSIS — E1142 Type 2 diabetes mellitus with diabetic polyneuropathy: Secondary | ICD-10-CM

## 2020-08-26 DIAGNOSIS — L97512 Non-pressure chronic ulcer of other part of right foot with fat layer exposed: Secondary | ICD-10-CM

## 2020-08-30 NOTE — Progress Notes (Signed)
Subjective: 65 year old female presents the office today for follow-up evaluation of ulceration of the right hallux as well as ulcer, abscess to the right submetatarsal 5.  States that the wound the big toe is doing about the same.  She continues changing the bandage daily and she denies any increase in swelling or redness or any drainage or pus or any red streaks.  Denies any fevers, chills, nausea, vomiting.  No calf pain, chest pain, shortness of breath.   Objective: AAO x3, NAD DP/PT pulses palpable bilaterally, CRT less than 3 seconds Right foot submetatarsal 5 hyperkeratotic lesion.  Upon debridement there is no underlying ulceration, drainage or any signs of infection today.  At the distal aspect of the right hallux hyperkeratotic lesion with central granular wound.  Today measures 0.4 x 0.4 x 0.1 cm without any probing to bone, undermining or tunneling. 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 No pain with calf compression, swelling, warmth, erythema  Assessment: Ulceration hallux with resolved cellulitis; resolving ulceration submetatarsal 5  Plan: -All treatment options discussed with the patient including all alternatives, risks, complications.  -Sharply debrided the wound of the hallux today without any complications utilizing #312 with scalpel to debride nonviable hyperkeratotic tissue down to healthy, granular tissue to promote wound healing.  She tolerated well.  She is to continue daily dressing changes and offloading.  Unfortunate this is likely due to increased blood sugar as well as the hallux malleus putting pressure and neuropathy.  At this point has been referred to the wound care center for another opinion on the wound.  -Monitor for any clinical signs or symptoms of infection and directed to call the office immediately should any occur or go to the ER.  Return in about 1 week   Vivi Barrack DPM

## 2020-09-01 ENCOUNTER — Encounter: Payer: Self-pay | Admitting: Podiatry

## 2020-09-01 ENCOUNTER — Other Ambulatory Visit: Payer: Self-pay

## 2020-09-01 ENCOUNTER — Ambulatory Visit (INDEPENDENT_AMBULATORY_CARE_PROVIDER_SITE_OTHER): Payer: BC Managed Care – PPO | Admitting: Podiatry

## 2020-09-01 DIAGNOSIS — L97512 Non-pressure chronic ulcer of other part of right foot with fat layer exposed: Secondary | ICD-10-CM

## 2020-09-01 DIAGNOSIS — E1142 Type 2 diabetes mellitus with diabetic polyneuropathy: Secondary | ICD-10-CM | POA: Diagnosis not present

## 2020-09-01 DIAGNOSIS — Z794 Long term (current) use of insulin: Secondary | ICD-10-CM | POA: Diagnosis not present

## 2020-09-06 ENCOUNTER — Encounter: Payer: Self-pay | Admitting: Podiatry

## 2020-09-06 NOTE — Progress Notes (Signed)
Subjective: 65 year old female presents the office today for follow-up evaluation of ulceration of the right hallux as well as ulcer, abscess to the right submetatarsal 5.  The wound and submetatarsal 5 continues to heal well not causing any issues.  Still has the wound on the tip of the big toe.  She states that she has not been great with her diet her sugars likely been high but she does not check it.  Denies any increase in swelling or redness or any drainage or pus.  No systemic concerns today including fevers, chills, nausea, vomiting.   Objective: AAO x3, NAD DP/PT pulses palpable bilaterally, CRT less than 3 seconds Right foot submetatarsal 5 hyperkeratotic lesion.  Upon debridement there is no underlying ulceration, drainage or any signs of infection today.  At the distal aspect of the right hallux hyperkeratotic lesion with central granular wound.  Today measures 0.4 x 0.4 x 0.1 cm without any probing to bone, undermining or tunneling. 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  Overall appears that the wound is about the same in size. No pain with calf compression, swelling, warmth, erythema  Assessment: Ulceration hallux with resolved cellulitis; resolved ulceration submetatarsal 5  Plan: -All treatment options discussed with the patient including all alternatives, risks, complications.  -Sharply debrided the wound of the hallux today without any complications utilizing #312 with scalpel to debride nonviable hyperkeratotic tissue down to healthy, granular tissue to promote wound healing.  She tolerated well.  She is to continue daily dressing changes and offloading.  Unfortunate this is likely due to increased blood sugar as well as the hallux malleus putting pressure and neuropathy.  Wound care evaluation as well. -Submetatarsal 5 area is healed.  Monitor for any reoccurrence. -Monitor for any clinical signs or symptoms of infection and directed to call the office immediately should any occur or go to  the ER.  Return in about 1 week   Vivi Barrack DPM

## 2020-09-08 ENCOUNTER — Ambulatory Visit: Payer: Medicare Other | Admitting: Podiatry

## 2020-09-12 DIAGNOSIS — M79676 Pain in unspecified toe(s): Secondary | ICD-10-CM

## 2020-09-14 ENCOUNTER — Other Ambulatory Visit: Payer: Self-pay

## 2020-09-14 ENCOUNTER — Encounter (HOSPITAL_BASED_OUTPATIENT_CLINIC_OR_DEPARTMENT_OTHER): Payer: BC Managed Care – PPO | Attending: Physician Assistant | Admitting: Physician Assistant

## 2020-09-14 DIAGNOSIS — E1142 Type 2 diabetes mellitus with diabetic polyneuropathy: Secondary | ICD-10-CM | POA: Diagnosis not present

## 2020-09-14 DIAGNOSIS — L97512 Non-pressure chronic ulcer of other part of right foot with fat layer exposed: Secondary | ICD-10-CM | POA: Insufficient documentation

## 2020-09-14 DIAGNOSIS — E11319 Type 2 diabetes mellitus with unspecified diabetic retinopathy without macular edema: Secondary | ICD-10-CM | POA: Insufficient documentation

## 2020-09-14 DIAGNOSIS — E11621 Type 2 diabetes mellitus with foot ulcer: Secondary | ICD-10-CM | POA: Diagnosis not present

## 2020-09-14 DIAGNOSIS — I10 Essential (primary) hypertension: Secondary | ICD-10-CM | POA: Diagnosis not present

## 2020-09-14 NOTE — Progress Notes (Signed)
[name] [name], [name] [name] ([id number]) Visit Report for 09/14/2020 Abuse/Suicide Risk Screen Details Patient Name: Date of Service: Start [name] 09/14/2020 1:15 PM Medical Record Number: [id number] Patient Account Number: [id number] Date of Birth/Sex: Treating RN: 07/06/1956 (65 y.o. [name] Primary Care Gannon Heinzman: [name] Other Clinician: Referring Aaniya Sterba: Treating Renda Pohlman/Extender: Guy Franco, Malena Weeks in Treatment: 0 Abuse/Suicide Risk Screen Items Answer ABUSE RISK SCREEN: Has anyone close to you tried to hurt or harm you recentlyo No Do you feel uncomfortable with anyone in your familyo No Has anyone forced you do things that you didnt want to doo No Electronic Signature(s) Signed: 09/14/2020 5:46:35 PM By: Zandra Abts RN, BSN Entered By: Zandra Abts on 09/14/2020 14:17:14 -------------------------------------------------------------------------------- Activities of Daily Living Details Patient Name: Date of Service: [name] [name] [name] 09/14/2020 1:15 PM Medical Record Number: [id number] Patient Account Number: [id number] Date of Birth/Sex: Treating RN: 1956/05/31 (65 y.o. [name] Primary Care Shiheem Corporan: [name] Other Clinician: Referring Fusaye Wachtel: Treating Hridaan Bouse/Extender: Guy Franco, Malena Weeks in Treatment: 0 Activities of Daily Living Items Answer Activities of Daily Living (Please select one for each item) Drive Automobile Completely Able T Medications ake Completely Able Use T elephone Completely Able Care for Appearance Completely Able Use T oilet Completely Able Bath / Shower Completely Able Dress Self Completely Able Feed Self Completely Able Walk Completely Able Get In / Out Bed Completely Able Housework Completely Able Prepare Meals Completely Able Handle Money Completely Able Shop for Self Completely Able Electronic Signature(s) Signed: 09/14/2020 5:46:35 PM By: Zandra Abts RN, BSN Entered  By: Zandra Abts on 09/14/2020 14:18:29 -------------------------------------------------------------------------------- Education Screening Details Patient Name: Date of Service: [name] [name], [name] [name] [name] 09/14/2020 1:15 PM Medical Record Number: [id number] Patient Account Number: [id number] Date of Birth/Sex: Treating RN: 1956-08-08 (65 y.o. [name] Primary Care Jonet Mathies: [name] Other Clinician: Referring Cashmere Dingley: Treating Calianne Larue/Extender: Guy Franco, Malena Weeks in Treatment: 0 Primary Learner Assessed: Patient Learning Preferences/Education Level/Primary Language Learning Preference: Explanation, Demonstration, Printed Material Highest Education Level: High School Preferred Language: English Cognitive Barrier Language Barrier: No Translator Needed: No Memory Deficit: No Emotional Barrier: No Cultural/Religious Beliefs Affecting Medical Care: No Physical Barrier Impaired Vision: No Impaired Hearing: No Decreased Hand dexterity: No Knowledge/Comprehension Knowledge Level: High Comprehension Level: High Ability to understand written instructions: High Ability to understand verbal instructions: High Motivation Anxiety Level: Calm Cooperation: Cooperative Education Importance: Acknowledges Need Interest in Health Problems: Asks Questions Perception: Coherent Willingness to Engage in Self-Management High Activities: Readiness to Engage in Self-Management High Activities: Electronic Signature(s) Signed: 09/14/2020 5:46:35 PM By: Zandra Abts RN, BSN Entered By: Zandra Abts on 09/14/2020 14:19:41 -------------------------------------------------------------------------------- Fall Risk Assessment Details Patient Name: Date of Service: [name] [name], [name] [name] [name] 09/14/2020 1:15 PM Medical Record Number: [id number] Patient Account Number: [id number] Date of Birth/Sex: Treating RN: 26-Jun-1956 (64 y.o. [name] Primary Care Avian Konigsberg: [name] Other Clinician: Referring Argusta Mcgann: Treating Halo Shevlin/Extender: Guy Franco, Malena Weeks in Treatment: 0 Fall Risk Assessment Items Have you had 2 or more falls in the last 12 monthso 0 No Have you had any fall that resulted in injury in the last 12 monthso 0 No FALLS RISK SCREEN History of falling - immediate or within 3 months 25 Yes Secondary diagnosis (Do you have 2 or more medical diagnoseso) 0 No Ambulatory aid None/bed rest/wheelchair/nurse 0 Yes Crutches/cane/walker 0 No Furniture 0 No Intravenous therapy Access/Saline/Heparin Lock 0 No Gait/Transferring Normal/ bed  rest/ wheelchair 0 Yes Weak (short steps with or without shuffle, stooped but able to lift head while walking, may seek 0 No support from furniture) Impaired (short steps with shuffle, may have difficulty arising from chair, head down, impaired 0 No balance) Mental Status Oriented to own ability 0 Yes Electronic Signature(s) Signed: 09/14/2020 5:46:35 PM By: Zandra Abts RN, BSN Entered By: Zandra Abts on 09/14/2020 14:19:56 -------------------------------------------------------------------------------- Foot Assessment Details Patient Name: Date of Service: [name] [name], [name] [name] [name] 09/14/2020 1:15 PM Medical Record Number: [id number] Patient Account Number: [id number] Date of Birth/Sex: Treating RN: [birth year] (65 y.o. [name] Primary Care Rakim Moone: [name] Other Clinician: Referring Levorn Oleski: Treating Lessly Stigler/Extender: Guy Franco, Malena Weeks in Treatment: 0 Foot Assessment Items Site Locations + = Sensation present, - = Sensation absent, C = Callus, U = Ulcer R = Redness, W = Warmth, M = Maceration, PU = Pre-ulcerative lesion F = Fissure, S = Swelling, D = Dryness Assessment Right: Left: Other Deformity: No No Prior Foot Ulcer: No No Prior Amputation: No No Charcot Joint: No No Ambulatory Status: Ambulatory Without Help Gait: Steady Electronic  Signature(s) Signed: 09/14/2020 5:46:35 PM By: Zandra Abts RN, BSN Entered By: Zandra Abts on 09/14/2020 14:23:48 -------------------------------------------------------------------------------- Nutrition Risk Screening Details Patient Name: Date of Service: [name] [name], [name] [name] [name] 09/14/2020 1:15 PM Medical Record Number: [id number] Patient Account Number: [id number] Date of Birth/Sex: Treating RN: Jan 05, 1956 (65 y.o. [name] Primary Care Ninetta Adelstein: [name] Other Clinician: Referring Prarthana Parlin: Treating Natoria Archibald/Extender: Guy Franco, Malena Weeks in Treatment: 0 Height (in): 62 Weight (lbs): 169 Body Mass Index (BMI): 30.9 Nutrition Risk Screening Items Score Screening NUTRITION RISK SCREEN: I have an illness or condition that made me change the kind and/or amount of food I eat 2 Yes I eat fewer than two meals per day 0 No I eat few fruits and vegetables, or milk products 0 No I have three or more drinks of beer, liquor or wine almost every day 0 No I have tooth or mouth problems that make it hard for me to eat 0 No I don't always have enough money to buy the food I need 0 No I eat alone most of the time 0 No I take three or more different prescribed or over-the-counter drugs a day 1 Yes Without wanting to, I have lost or gained 10 pounds in the last six months 0 No I am not always physically able to shop, cook and/or feed myself 0 No Nutrition Protocols Good Risk Protocol Moderate Risk Protocol 0 Provide education on nutrition High Risk Proctocol Risk Level: Moderate Risk Score: 3 Electronic Signature(s) Signed: 09/14/2020 5:46:35 PM By: Zandra Abts RN, BSN Entered By: Zandra Abts on 09/14/2020 14:20:26

## 2020-09-14 NOTE — Progress Notes (Signed)
[name] [name], [name] [name] ([id number]) Visit Report for 09/14/2020 Chief Complaint Document Details Patient Name: Date of Service: [place] [name] 09/14/2020 1:15 PM Medical Record Number: [id number] Patient Account Number: [id number] Date of Birth/Sex: Treating RN: November 03, 1955 (66 y.o. Elam Dutch Primary Care Provider: Elvia Collum Other Clinician: Referring Provider: Treating Provider/Extender: Marjory Lies, Malena Weeks in Treatment: 0 Information Obtained from: Patient Chief Complaint Right 1st toe ulcer Electronic Signature(s) Signed: 09/14/2020 2:57:08 PM By: Worthy Keeler PA-C Entered By: Worthy Keeler on 09/14/2020 14:57:07 -------------------------------------------------------------------------------- Debridement Details Patient Name: Date of Service: [facility], [name] 09/14/2020 1:15 PM Medical Record Number: [id number] Patient Account Number: [id number] Date of Birth/Sex: Treating RN: 02/08/1956 (65 y.o. Martyn Malay, Linda Primary Care Provider: Elvia Collum Other Clinician: Referring Provider: Treating Provider/Extender: Marjory Lies, Malena Weeks in Treatment: 0 Debridement Performed for Assessment: Wound #2 Right T Great oe Performed By: Physician Worthy Keeler, PA Debridement Type: Debridement Severity of Tissue Pre Debridement: Fat layer exposed Level of Consciousness (Pre-procedure): Awake and Alert Pre-procedure Verification/Time Out Yes - 14:55 Taken: Start Time: 14:56 T Area Debrided (L x W): otal 0.4 (cm) x 0.5 (cm) = 0.2 (cm) Tissue and other material debrided: Viable, Non-Viable, Callus, Slough, Subcutaneous, Skin: Epidermis, Slough Level: Skin/Subcutaneous Tissue Debridement Description: Excisional Instrument: Curette Bleeding: Minimum Hemostasis Achieved: Pressure End Time: 15:05 Procedural Pain: 0 Post Procedural Pain: 0 Response to Treatment: Procedure was tolerated well Level of Consciousness (Post- Awake and  Alert procedure): Post Debridement Measurements of Total Wound Length: (cm) 1 Width: (cm) 1 Depth: (cm) 0.1 Volume: (cm) 0.079 Character of Wound/Ulcer Post Debridement: Improved Severity of Tissue Post Debridement: Fat layer exposed Post Procedure Diagnosis Same as Pre-procedure Electronic Signature(s) Signed: 09/14/2020 5:21:21 PM By: Worthy Keeler PA-C Signed: 09/14/2020 5:50:42 PM By: Baruch Gouty RN, BSN Entered By: Baruch Gouty on 09/14/2020 15:04:07 -------------------------------------------------------------------------------- HPI Details Patient Name: Date of Service: [name] [name], [name] 09/14/2020 1:15 PM Medical Record Number: [id number] Patient Account Number: [id number] Date of Birth/Sex: Treating RN: 12-15-1955 (65 y.o. Elam Dutch Primary Care Provider: Elvia Collum Other Clinician: Referring Provider: Treating Provider/Extender: Marjory Lies, Malena Weeks in Treatment: 0 History of Present Illness HPI Description: 65 year old female referred to [place] by her PCP Dr. Delrae Rend, with a past medical history of type 2 diabetes mellitus with diabetic polyneuropathy, diabetic retinopathy, hyperglycemia, fracture of right great toe, diabetic foot ulcer. she has never been a smoker. The patient has had a lower extremity arterial study done in April of this year and the right ABI was 1.27 and the left ABI was 1.14. The right toe brachial index was 0.60 and the left was 0.59. The impression was no evidence of hemodynamically significant lower extremity arterial occlusive disease through the ankles bilaterally. Decreased pulse volume recordings of the left great toe suggests distal small vessel disease. note is made of the fact that the patient was seen in early April of this year in the ED with what was thought to be a cellulitis and was put on doxycycline and was asked to follow-up with her PCP. x-ray done at that stage revealed -- IMPRESSION:There is a  corner fracture at medial base of distal phalanx great toe. Clinical correlation is necessary to exclude recurrent fracture or nonunion of previous fracture. There is diffuse soft tissue swelling great toe. The patient said she had some back surgery in 2015 which led to what sounds like a foot drop and  she has been having problems raising her foot and walking well and the right big toe was injured and fractured a while ago. She also mentioned she does not take very good care of her diabetes diet. 01/24/2016 -- right foot x-ray -- IMPRESSION:There is healing corner fracture at the medial base of distal phalanx great toe with partial union. There is soft tissue swelling right great toe. On oblique view there is subtle cortical irregularity distal medial aspect of proximal phalanx great toe. Osteomyelitis cannot be excluded. Clinical correlation is necessary.Further correlation with MRI is recommended as clinically warranted. In view of the above findings we will order a MRI, which has not been done yet and see her back next week with this result. 01/31/2016 - MRI of the right foot -- IMPRESSION:1. Diffuse edema and enhancement of the entire distal phalanx of the great toe within the adjacent soft tissue ulceration and soft tissue devitalization. I suspect this represents early diffuse osteomyelitis. 2. No evidence of osteomyelitis of the proximal phalanx of the great toe. 3. Probable cellulitis along the lateral aspect of the first metatarsal. 02/07/2016 -- she is still awaiting insurance clearance to start her hyperbaric oxygen therapy and the appointment to see Dr. Mayo Ao. 03/24/2016 -- the patient has been continuing with hyperbaric oxygen therapy and has finished 17 treatments. She has a infectious disease consult with Dr. Linus Salmons tomorrow She was seen by Dr. Mayo Ao on 03/09/2016 and his plan was to start the patient on clindamycin and treat her for osteomyelitis. So far the patient  has been on oral antibiotics The pathology was negative for osteomyelitis however the bone culture was positive. We will get this report from Dr. Pasty Arch office. Addendum : we have received the reports from Dr. Pasty Arch office and the pathology showed benign bone with new bone formation and no evidence of significant inflammation. Culture report was a Beta Hemolytic Streptococcus which was sensitive penicillins and other beta lactams and rare Staphylococcus aureus, MSSA, which was sensitive to tetracycline and clindamycin and Bactrim ciprofloxacin and oxacillin. 03/28/2016 -- was seen by Dr. Scharlene Gloss who reviewed her history and physical and x-rays and for the culture positive bone biopsy with negative pathology for osteomyelitis he did not recommend long-term IV antibiotics. T treat her orally for 21 days and because it was pansensitive recommended o Keflex 500 mg 4 times a day for 21 days. He would see her back in 4 weeks to recheck an ESR and CRP and if worse would consider IV antibiotics at that time. 04/04/2016 -- she dropped a sharp object on her left dorsum of the foot a couple of days ago but there was a superficial abrasion and no deep open wound. 04/18/2016 -- she was seen by Dr. Berton Bon of infectious disease who plan to stop antibiotics and observe and check her CRP and ESR and she would follow when necessary for concerns. the sedimentation rate was 4 mm per hour, and the CRP was less than 0.5 mg per DL she was also seen by Dr. Mayo Ao who did an x-ray of the right foot but his notes and report is not available yet. 04/25/2016 -- I have reviewed the notes from Dr. Mayo Ao who saw her on 04/13/2016. no x-rays were taken in follow-up. The patient was asked to continue with care at the wound center. 05/09/2016 -- she was reviewed by Dr. Mayo Ao on Monday and he has discussed getting her diabetic shoes made. Other than that no changes were recommended.  05/15/16 patient  arrives today with the wound on the tip of her great toe measuring at 0.6 cm. This probes down to bony surface. From what I understand this is a deterioration from last visit. She is using Prisma and changing this daily at home she has an offloading boot. 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 She cannot wear an offloading boot 05/24/16; using Prisma. We are making attempts to more aggressively offload the right great toe which is flexed at the interphalangeal joint by supporting this and keeping it forward in her footwear. 06/13/2016 -- the patient has had no discharge tenderness or cellulitis of the right great toe. Was seen by Dr. Mayo Ao on 06/08/2016 and an x-ray was done by him which showed there was some cortical changes at the distal aspect of the hallux but there was no extension or worsening of osteomyelitis and there was no soft tissue emphysema. He had made arrangements for her diabetic shoes but was awaiting insurance clearance. Readmission: 09/14/2020 upon evaluation today patient appears to be doing somewhat poorly in regard to her right great toe ulcer. She tells me currently that she has been having issues with this since September 2021. She has been seen by Dr. Earleen Newport. With that being said I did review the notes. It does appear that the patient has some issue with her great toe some contraction causes this to plantar flex which unfortunately makes the end of the toe actually rub on any shoe she wears. She has a PEG assist offloading shoe but unfortunately she is worn a hole while her big toe is which means that when she wears this her toe does fix right down until it and then continues to wrap around. Nonetheless this is not optimal and in fact is not even going to be  beneficial for her. She did have an x- ray on December 23 done by Dr. Earleen Newport there was no evidence of bony destruction to indicate osteomyelitis according to his note. I agree it does not appear to be quite that significant visually. Patient's last hemoglobin A1c stated to be 8.0 in December 2021. She has been on Augmentin as prescribed by Dr. Earleen Newport. She does have a PEG assist offloading shoe but as mentioned above is really not functioning appropriately for her at this point. Again all this started in September and really she tells me has been a frustration for her she still out of work and that is given her some concern and trouble as well. There is not appear to be any evidence of local or systemic infection. No fevers, chills, nausea, vomiting, or diarrhea. The patient does have hypertension. Electronic Signature(s) Signed: 09/14/2020 3:16:42 PM By: Worthy Keeler PA-C Entered By: Worthy Keeler on 09/14/2020 15:16:41 -------------------------------------------------------------------------------- Physical Exam Details Patient Name: Date of Service: [place], [name] 09/14/2020 1:15 PM Medical Record Number: [id number] Patient Account Number: [id number] Date of Birth/Sex: Treating RN: [birth year] (65 y.o. Elam Dutch Primary Care Provider: Elvia Collum Other Clinician: Referring Provider: Treating Provider/Extender: Marjory Lies, Malena Weeks in Treatment: 0 Constitutional patient is hypertensive.. pulse regular and within target range for patient.Marland Kitchen respirations regular, non-labored and within target range for patient.Marland Kitchen temperature within target range for patient.. Well-nourished  and well-hydrated in no acute distress. Eyes conjunctiva clear no eyelid edema noted. pupils equal round and reactive to light and accommodation. Ears, Nose, Mouth, and Throat no gross abnormality of ear auricles or external auditory canals. normal hearing noted during conversation. mucus  membranes moist. Respiratory normal breathing without difficulty. Cardiovascular 2+ dorsalis pedis/posterior tibialis pulses. no clubbing, cyanosis, significant edema, <3 sec cap refill. Musculoskeletal normal gait and posture. no significant deformity or arthritic changes, no loss or range of motion, no clubbing. Psychiatric this patient is able to make decisions and demonstrates good insight into disease process. Alert and Oriented x 3. pleasant and cooperative. Notes Upon inspection patient's wound bed actually showed signs of fairly good granulation at this time there was some slough noted on the surface of the wound that did require sharp debridement. There was a significant amount of callus that I had removed today which once removed the wound bed did not appear to be doing quite as severely in my opinion. Overall I think that one of the biggest issues here is the friction causing callus buildup. If we can manage that and optimize her offloading I think that we can probably get this to heal fairly quickly. She is seeing appears to have excellent blood flow which is great news. Electronic Signature(s) Signed: 09/14/2020 3:17:24 PM By: Worthy Keeler PA-C Entered By: Worthy Keeler on 09/14/2020 15:17:24 -------------------------------------------------------------------------------- Physician Orders Details Patient Name: Date of Service: [facility], [name] 09/14/2020 1:15 PM Medical Record Number: [id number] Patient Account Number: [id number] Date of Birth/Sex: Treating RN: 08/31/1955 (65 y.o. Elam Dutch Primary Care Provider: Elvia Collum Other Clinician: Referring Provider: Treating Provider/Extender: Marjory Lies, Malena Weeks in Treatment: 0 Verbal / Phone Orders: No Diagnosis Coding ICD-10 Coding Code Description E11.621 Type 2 diabetes mellitus with foot ulcer L97.512 Non-pressure chronic ulcer of other part of right foot with fat layer exposed E11.42  Type 2 diabetes mellitus with diabetic polyneuropathy I10 Essential (primary) hypertension Follow-up Appointments Return Appointment in 1 week. Bathing/ Shower/ Hygiene May shower and wash wound with soap and water. - with dressing changes Off-Loading Open toe surgical shoe to: - right foot with felt pad to offload great toe Wound Treatment Wound #2 - T Great oe Wound Laterality: Right Prim Dressing: Promogran Prisma Matrix, 4.34 (sq in) (silver collagen) (DME) (Dispense As Written) Every Other Day/30 Days ary Discharge Instructions: Moisten collagen with saline or hydrogel Secondary Dressing: Woven Gauze Sponge, Non-Sterile 4x4 in (DME) (Generic) Every Other Day/30 Days Discharge Instructions: roll up and plce under base of great toe to lift tip of toe Secondary Dressing: Woven Gauze Sponges 2x2 in (DME) (Generic) Every Other Day/30 Days Discharge Instructions: Apply over primary dressing as directed. Secured With: Child psychotherapist, Sterile 2x75 (in/in) (DME) (Generic) Every Other Day/30 Days Discharge Instructions: Secure with stretch gauze as directed. Secured With: Ferguson Adherent Cohesive Bandage, Non-Sterile,1x5 (in/yd) (DME) (Generic) Every Other Day/30 Days Electronic Signature(s) Signed: 09/14/2020 5:21:21 PM By: Worthy Keeler PA-C Signed: 09/14/2020 5:50:42 PM By: Baruch Gouty RN, BSN Entered By: Baruch Gouty on 09/14/2020 15:14:19 -------------------------------------------------------------------------------- Problem List Details Patient Name: Date of Service: [name] [name], [name] 09/14/2020 1:15 PM Medical Record Number: [id number] Patient Account Number: [id number] Date of Birth/Sex: Treating RN: [birth year] (65 y.o. Elam Dutch Primary Care Provider: Elvia Collum Other Clinician: Referring Provider: Treating Provider/Extender: Marjory Lies, Malena Weeks in Treatment: 0 Active Problems ICD-10 Encounter Code  Description Active Date  MDM Diagnosis E11.621 Type 2 diabetes mellitus with foot ulcer 09/14/2020 No Yes L97.512 Non-pressure chronic ulcer of other part of right foot with fat layer exposed 09/14/2020 No Yes E11.42 Type 2 diabetes mellitus with diabetic polyneuropathy 09/14/2020 No Yes I10 Essential (primary) hypertension 09/14/2020 No Yes Inactive Problems Resolved Problems Electronic Signature(s) Signed: 09/14/2020 2:56:53 PM By: Worthy Keeler PA-C Previous Signature: 09/14/2020 2:56:38 PM Version By: Worthy Keeler PA-C Entered By: Worthy Keeler on 09/14/2020 14:56:53 -------------------------------------------------------------------------------- Progress Note/History and Physical Details Patient Name: Date of Service: [facility], [name] 09/14/2020 1:15 PM Medical Record Number: [id number] Patient Account Number: [id number] Date of Birth/Sex: Treating RN: [birth year] (65 y.o. Elam Dutch Primary Care Provider: Elvia Collum Other Clinician: Referring Provider: Treating Provider/Extender: Marjory Lies, Malena Weeks in Treatment: 0 Subjective Chief Complaint Information obtained from Patient Right 1st toe ulcer History of Present Illness (HPI) 65 year old female referred to [place] by her PCP Dr. Delrae Rend, with a past medical history of type 2 diabetes mellitus with diabetic polyneuropathy, diabetic retinopathy, hyperglycemia, fracture of right great toe, diabetic foot ulcer. she has never been a smoker. The patient has had a lower extremity arterial study done in April of this year and the right ABI was 1.27 and the left ABI was 1.14. The right toe brachial index was 0.60 and the left was 0.59. The impression was no evidence of hemodynamically significant lower extremity arterial occlusive disease through the ankles bilaterally. Decreased pulse volume recordings of the left great toe suggests distal small vessel disease. note is made of the fact that the patient was seen in  early April of this year in the ED with what was thought to be a cellulitis and was put on doxycycline and was asked to follow-up with her PCP. x-ray done at that stage revealed -- IMPRESSION:There is a corner fracture at medial base of distal phalanx great toe. Clinical correlation is necessary to exclude recurrent fracture or nonunion of previous fracture. There is diffuse soft tissue swelling great toe. The patient said she had some back surgery in 2015 which led to what sounds like a foot drop and she has been having problems raising her foot and walking well and the right big toe was injured and fractured a while ago. She also mentioned she does not take very good care of her diabetes diet. 01/24/2016 -- right foot x-ray -- IMPRESSION:There is healing corner fracture at the medial base of distal phalanx great toe with partial union. There is soft tissue swelling right great toe. On oblique view there is subtle cortical irregularity distal medial aspect of proximal phalanx great toe. Osteomyelitis cannot be excluded. Clinical correlation is necessary.Further correlation with MRI is recommended as clinically warranted. In view of the above findings we will order a MRI, which has not been done yet and see her back next week with this result. 01/31/2016 - MRI of the right foot -- IMPRESSION:1. Diffuse edema and enhancement of the entire distal phalanx of the great toe within the adjacent soft tissue ulceration and soft tissue devitalization. I suspect this represents early diffuse osteomyelitis. 2. No evidence of osteomyelitis of the proximal phalanx of the great toe. 3. Probable cellulitis along the lateral aspect of the first metatarsal. 02/07/2016 -- she is still awaiting insurance clearance to start her hyperbaric oxygen therapy and the appointment to see Dr. Mayo Ao. 03/24/2016 -- the patient has been continuing with hyperbaric oxygen therapy and has finished 17 treatments. She has a  infectious disease consult with Dr. Linus Salmons tomorrow She was seen by Dr. Mayo Ao on 03/09/2016 and his plan was to start the patient on clindamycin and treat her for osteomyelitis. So far the patient has been on oral antibiotics The pathology was negative for osteomyelitis however the bone culture was positive. We will get this report from Dr. Pasty Arch office. Addendum : we have received the reports from Dr. Pasty Arch office and the pathology showed benign bone with new bone formation and no evidence of significant inflammation. Culture report was a Beta Hemolytic Streptococcus which was sensitive penicillins and other beta lactams and rare Staphylococcus aureus, MSSA, which was sensitive to tetracycline and clindamycin and Bactrim ciprofloxacin and oxacillin. 03/28/2016 -- was seen by Dr. Scharlene Gloss who reviewed her history and physical and x-rays and for the culture positive bone biopsy with negative pathology for osteomyelitis he did not recommend long-term IV antibiotics. T treat her orally for 21 days and because it was pansensitive recommended o Keflex 500 mg 4 times a day for 21 days. He would see her back in 4 weeks to recheck an ESR and CRP and if worse would consider IV antibiotics at that time. 04/04/2016 -- she dropped a sharp object on her left dorsum of the foot a couple of days ago but there was a superficial abrasion and no deep open wound. 04/18/2016 -- she was seen by Dr. Berton Bon of infectious disease who plan to stop antibiotics and observe and check her CRP and ESR and she would follow when necessary for concerns. the sedimentation rate was 4 mm per hour, and the CRP was less than 0.5 mg per DL she was also seen by Dr. Mayo Ao who did an x-ray of the right foot but his notes and report is not available yet. 04/25/2016 -- I have reviewed the notes from Dr. Mayo Ao who saw her on 04/13/2016. no x-rays were taken in follow-up. The patient was asked to continue with  care at the wound center. 05/09/2016 -- she was reviewed by Dr. Mayo Ao on Monday and he has discussed getting her diabetic shoes made. Other than that no changes were recommended. 05/15/16 patient arrives today with the wound on the tip of her great toe measuring at 0.6 cm. This probes down to bony surface. From what I understand this is a deterioration from last visit. She is using Prisma and changing this daily at home she has an offloading boot. 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 She cannot wear an offloading boot 05/24/16; using Prisma. We are making attempts to more aggressively offload the right great toe which is flexed at the interphalangeal joint by supporting this and keeping it forward in her footwear. 06/13/2016 -- the patient has had no discharge tenderness or cellulitis of the right great toe. Was seen by Dr. Mayo Ao on 06/08/2016 and an x-ray was done by him which showed there was some cortical changes at the distal aspect of the hallux but there was no extension or worsening of osteomyelitis and there was no soft tissue emphysema. He had made arrangements for her diabetic shoes but was awaiting insurance clearance. Readmission: 09/14/2020 upon evaluation today patient appears to be doing somewhat poorly in regard to her right great  toe ulcer. She tells me currently that she has been having issues with this since September 2021. She has been seen by Dr. Earleen Newport. With that being said I did review the notes. It does appear that the patient has some issue with her great toe some contraction causes this to plantar flex which unfortunately makes the end of the toe actually rub on any shoe she wears. She has a PEG assist offloading shoe but unfortunately she is worn a  hole while her big toe is which means that when she wears this her toe does fix right down until it and then continues to wrap around. Nonetheless this is not optimal and in fact is not even going to be beneficial for her. She did have an x- ray on December 23 done by Dr. Earleen Newport there was no evidence of bony destruction to indicate osteomyelitis according to his note. I agree it does not appear to be quite that significant visually. Patient's last hemoglobin A1c stated to be 8.0 in December 2021. She has been on Augmentin as prescribed by Dr. Earleen Newport. She does have a PEG assist offloading shoe but as mentioned above is really not functioning appropriately for her at this point. Again all this started in September and really she tells me has been a frustration for her she still out of work and that is given her some concern and trouble as well. There is not appear to be any evidence of local or systemic infection. No fevers, chills, nausea, vomiting, or diarrhea. The patient does have hypertension. Patient History Information obtained from Patient. Allergies glipizide (Severity: Moderate), ACE Inhibitors, meclizine Family History Cancer - Mother, Diabetes - Mother,Father, No family history of Heart Disease, Hereditary Spherocytosis, Hypertension, Kidney Disease, Lung Disease, Seizures, Stroke, Thyroid Problems, Tuberculosis. Social History Never smoker, Marital Status - Married, Alcohol Use - Never, Drug Use - No History, Caffeine Use - Rarely. Medical History Eyes Denies history of Cataracts, Glaucoma, Optic Neuritis Ear/Nose/Mouth/Throat Denies history of Chronic sinus problems/congestion, Middle ear problems Hematologic/Lymphatic Patient has history of Anemia Denies history of Hemophilia, Human Immunodeficiency Virus, Lymphedema, Sickle Cell Disease Respiratory Denies history of Aspiration, Asthma, Chronic Obstructive Pulmonary Disease (COPD), Pneumothorax, Sleep Apnea,  Tuberculosis Cardiovascular Patient has history of Hypertension Denies history of Angina, Arrhythmia, Congestive Heart Failure, Coronary Artery Disease, Deep Vein Thrombosis, Hypotension, Myocardial Infarction, Peripheral Arterial Disease, Peripheral Venous Disease, Phlebitis, Vasculitis Gastrointestinal Denies history of Cirrhosis , Colitis, Crohnoos, Hepatitis A, Hepatitis B, Hepatitis C Endocrine Patient has history of Type II Diabetes Denies history of Type I Diabetes Genitourinary Denies history of End Stage Renal Disease Immunological Denies history of Lupus Erythematosus, Raynaudoos, Scleroderma Integumentary (Skin) Denies history of History of Burn Musculoskeletal Denies history of Gout, Rheumatoid Arthritis, Osteoarthritis, Osteomyelitis Neurologic Patient has history of Neuropathy Denies history of Dementia, Quadriplegia, Paraplegia, Seizure Disorder Oncologic Denies history of Received Chemotherapy, Received Radiation Psychiatric Denies history of Anorexia/bulimia, Confinement Anxiety Patient is treated with Insulin, Oral Agents. Blood sugar is tested. Blood sugar results noted at the following times: Breakfast - 120-130. Hospitalization/Surgery History - Complete hysterectomy. Medical A Surgical History Notes nd Cardiovascular CVA 2018, Hyperlipidemia Review of Systems (ROS) Constitutional Symptoms (General Health) Denies complaints or symptoms of Fatigue, Fever, Chills, Marked Weight Change. Eyes Denies complaints or symptoms of Dry Eyes, Vision Changes, Glasses / Contacts. Ear/Nose/Mouth/Throat Denies complaints or symptoms of Chronic sinus problems or rhinitis. Respiratory Denies complaints or symptoms of Chronic or frequent coughs, Shortness of Breath. Gastrointestinal Denies complaints or symptoms of Frequent  diarrhea, Nausea, Vomiting. Genitourinary Denies complaints or symptoms of Frequent urination. Integumentary (Skin) Complains or has symptoms of  Wounds - wound on right great toe. Objective Constitutional patient is hypertensive.. pulse regular and within target range for patient.Marland Kitchen respirations regular, non-labored and within target range for patient.Marland Kitchen temperature within target range for patient.. Well-nourished and well-hydrated in no acute distress. Vitals Time Taken: 2:00 PM, Height: 62 in, Source: Stated, Weight: 169 lbs, Source: Stated, BMI: 30.9, Temperature: 97.9 F, Pulse: 72 bpm, Respiratory Rate: 16 breaths/min, Blood Pressure: 160/71 mmHg, Capillary Blood Glucose: 124 mg/dl. General Notes: glucose per pt report Eyes conjunctiva clear no eyelid edema noted. pupils equal round and reactive to light and accommodation. Ears, Nose, Mouth, and Throat no gross abnormality of ear auricles or external auditory canals. normal hearing noted during conversation. mucus membranes moist. Respiratory normal breathing without difficulty. Cardiovascular 2+ dorsalis pedis/posterior tibialis pulses. no clubbing, cyanosis, significant edema, Musculoskeletal normal gait and posture. no significant deformity or arthritic changes, no loss or range of motion, no clubbing. Psychiatric this patient is able to make decisions and demonstrates good insight into disease process. Alert and Oriented x 3. pleasant and cooperative. General Notes: Upon inspection patient's wound bed actually showed signs of fairly good granulation at this time there was some slough noted on the surface of the wound that did require sharp debridement. There was a significant amount of callus that I had removed today which once removed the wound bed did not appear to be doing quite as severely in my opinion. Overall I think that one of the biggest issues here is the friction causing callus buildup. If we can manage that and optimize her offloading I think that we can probably get this to heal fairly quickly. She is seeing appears to have excellent blood flow which is  great news. Integumentary (Hair, Skin) Wound #2 status is Open. Original cause of wound was Gradually Appeared. The wound is located on the Right T Great. The wound measures 0.4cm length x oe 0.5cm width x 0.2cm depth; 0.157cm^2 area and 0.031cm^3 volume. There is Fat Layer (Subcutaneous Tissue) exposed. There is no tunneling or undermining noted. There is a medium amount of serosanguineous drainage noted. The wound margin is flat and intact. There is large (67-100%) pink, pale granulation within the wound bed. There is a small (1-33%) amount of necrotic tissue within the wound bed including Adherent Slough. 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 There was a Excisional Skin/Subcutaneous Tissue Debridement with a total area of 0.2 sq cm performed by Worthy Keeler, PA. With the following instrument(s): Curette to remove Viable and Non-Viable tissue/material. Material removed includes Callus, Subcutaneous Tissue, Slough, and Skin: Epidermis. No specimens were taken. A time out was conducted at 14:55, prior to the start of the procedure. A Minimum amount of bleeding was controlled with Pressure. The procedure was tolerated well with a pain level of 0 throughout and a pain level of 0 following the procedure. Post Debridement Measurements: 1cm length x 1cm width x 0.1cm depth; 0.079cm^3 volume. Character of Wound/Ulcer Post Debridement is improved. Severity of Tissue Post Debridement is: Fat layer exposed. Post procedure Diagnosis  Wound #2: Same as Pre-Procedure Plan Follow-up Appointments: Return  Appointment in 1 week. Bathing/ Shower/ Hygiene: May shower and wash wound with soap and water. - with dressing changes Off-Loading: Open toe surgical shoe to: - right foot with felt pad to offload great toe WOUND #2: - T Great Wound Laterality: Right oe Prim Dressing: Promogran Prisma Matrix, 4.34 (sq in) (silver collagen) (DME) (Dispense As Written) Every Other Day/30 Days ary Discharge Instructions: Moisten collagen with saline or hydrogel Secondary Dressing: Woven Gauze Sponge, Non-Sterile 4x4 in (DME) (Generic) Every Other Day/30 Days Discharge Instructions: roll up and plce under base of great toe to lift tip of toe Secondary Dressing: Woven Gauze Sponges 2x2 in (DME) (Generic) Every Other Day/30 Days Discharge Instructions: Apply over primary dressing as directed. Secured With: Child psychotherapist, Sterile 2x75 (in/in) (DME) (Generic) Every Other Day/30 Days Discharge Instructions: Secure with stretch gauze as directed. Secured With: [place] Self Adherent Cohesive Bandage, Non-Sterile,1x5 (in/yd) (DME) (Generic) Every Other Day/30 Days 1. Would recommend currently that we go ahead and initiate treatment with a silver collagen dressing. I think this really would be the best way to go based on what I am seeing currently. 2. I am also can recommend that we actually use a rolled up 4 x 4 gauze underneath the toe in order to keep it from contracting and plantar flexing. I think this will prevent it from rubbing as well and subsequently I think this may be the absolute best thing to see this heal more effectively and quickly. 3. I am also can recommend a new shoe for the patient as her PEG assist shoe is completely worn out and is doing nothing for her at this point. We will see patient back for reevaluation in 1 week here in the clinic. If anything worsens or changes patient will contact our office for additional recommendations. Electronic Signature(s) Signed:  09/14/2020 3:18:28 PM By: Worthy Keeler PA-C Entered By: Worthy Keeler on 09/14/2020 15:18:27 -------------------------------------------------------------------------------- HxROS Details Patient Name: Date of Service: [name], [name] 09/14/2020 1:15 PM Medical Record Number: [id number] Patient Account Number: [id number] Date of Birth/Sex: Treating RN: [birth year] (65 y.o. Nancy Fetter Primary Care Provider: Elvia Collum Other Clinician: Referring Provider: Treating Provider/Extender: Marjory Lies, Malena Weeks in Treatment: 0 Label Progress Note Print Version as History and Physical for this encounter Information Obtained From Patient Constitutional Symptoms (General Health) Complaints and Symptoms: Negative for: Fatigue; Fever; Chills; Marked Weight Change Eyes Complaints and Symptoms: Negative for: Dry Eyes; Vision Changes; Glasses / Contacts Medical History: Negative for: Cataracts; Glaucoma; Optic Neuritis Ear/Nose/Mouth/Throat Complaints and Symptoms: Negative for: Chronic sinus problems or rhinitis Medical History: Negative for: Chronic sinus problems/congestion; Middle ear problems Respiratory Complaints and Symptoms: Negative for: Chronic or frequent coughs; Shortness of Breath Medical History: Negative for: Aspiration; Asthma; Chronic Obstructive Pulmonary Disease (COPD); Pneumothorax; Sleep Apnea; Tuberculosis Gastrointestinal Complaints and Symptoms: Negative for: Frequent diarrhea; Nausea; Vomiting Medical History: Negative for: Cirrhosis ; Colitis; Crohns; Hepatitis A; Hepatitis B; Hepatitis C Genitourinary Complaints and Symptoms: Negative for: Frequent urination Medical History: Negative for: End Stage Renal Disease Integumentary (Skin) Complaints and Symptoms: Positive for: Wounds - wound on right great toe Medical History: Negative for: History of Burn Hematologic/Lymphatic Medical History: Positive for: Anemia Negative for:  Hemophilia; Human Immunodeficiency Virus; Lymphedema; Sickle Cell Disease Cardiovascular Medical History: Positive for: Hypertension Negative for: Angina; Arrhythmia; Congestive Heart Failure; Coronary Artery Disease; Deep Vein Thrombosis; Hypotension; Myocardial Infarction; Peripheral Arterial  Disease; Peripheral Venous Disease; Phlebitis; Vasculitis Past Medical History Notes: CVA 2018, Hyperlipidemia Endocrine Medical History: Positive for: Type II Diabetes Negative for: Type I Diabetes Time with diabetes: 20 years Treated with: Insulin, Oral agents Blood sugar tested every day: Yes Tested : twice a day Blood sugar testing results: Breakfast: 120-130 Immunological Medical History: Negative for: Lupus Erythematosus; Raynauds; Scleroderma Musculoskeletal Medical History: Negative for: Gout; Rheumatoid Arthritis; Osteoarthritis; Osteomyelitis Neurologic Medical History: Positive for: Neuropathy Negative for: Dementia; Quadriplegia; Paraplegia; Seizure Disorder Oncologic Medical History: Negative for: Received Chemotherapy; Received Radiation Psychiatric Medical History: Negative for: Anorexia/bulimia; Confinement Anxiety Immunizations Pneumococcal Vaccine: Received Pneumococcal Vaccination: Yes Immunization Notes: Pt. unsure Implantable Devices None Hospitalization / Surgery History Type of Hospitalization/Surgery Complete hysterectomy Family and Social History Cancer: Yes - Mother; Diabetes: Yes - Mother,Father; Heart Disease: No; Hereditary Spherocytosis: No; Hypertension: No; Kidney Disease: No; Lung Disease: No; Seizures: No; Stroke: No; Thyroid Problems: No; Tuberculosis: No; Never smoker; Marital Status - Married; Alcohol Use: Never; Drug Use: No History; Caffeine Use: Rarely; Financial Concerns: No; Food, Clothing or Shelter Needs: No; Support System Lacking: No; Transportation Concerns: No Electronic Signature(s) Signed: 09/14/2020 5:21:21 PM By: Worthy Keeler  PA-C Signed: 09/14/2020 5:46:35 PM By: Levan Hurst RN, BSN Entered By: Levan Hurst on 09/14/2020 14:17:07 -------------------------------------------------------------------------------- [facility] Details Patient Name: Date of Service: [name] [name], [name] 09/14/2020 Medical Record Number: [id number] Patient Account Number: [id number] Date of Birth/Sex: Treating RN: 10-29-1955 (65 y.o. Martyn Malay, Linda Primary Care Provider: Elvia Collum Other Clinician: Referring Provider: Treating Provider/Extender: Marjory Lies, Malena Weeks in Treatment: 0 Diagnosis Coding ICD-10 Codes Code Description E11.621 Type 2 diabetes mellitus with foot ulcer L97.512 Non-pressure chronic ulcer of other part of right foot with fat layer exposed E11.42 Type 2 diabetes mellitus with diabetic polyneuropathy I10 Essential (primary) hypertension Facility Procedures CPT4 Code: [id number] Description: 99213 - WOUND CARE VISIT-LEV 3 EST PT Modifier: 25 Quantity: 1 Physician Procedures : CPT4 Code Description Modifier [id number] WC PHYS LEVEL 3 NEW PT 25 ICD-10 Diagnosis Description E11.621 Type 2 diabetes mellitus with foot ulcer L97.512 Non-pressure chronic ulcer of other part of right foot with fat layer exposed E11.42 Type 2 diabetes  mellitus with diabetic polyneuropathy I10 Essential (primary) hypertension Quantity: 1 : [id number] - WC PHYS SUBQ TISS 20 SQ CM ICD-10 Diagnosis Description L97.512 Non-pressure chronic ulcer of other part of right foot with fat layer exposed Quantity: 1 Electronic Signature(s) Signed: 09/14/2020 3:18:46 PM By: Worthy Keeler PA-C Entered By: Worthy Keeler on 09/14/2020 15:18:46

## 2020-09-15 ENCOUNTER — Ambulatory Visit: Payer: Medicare Other | Admitting: Podiatry

## 2020-09-19 DIAGNOSIS — S91101A Unspecified open wound of right great toe without damage to nail, initial encounter: Secondary | ICD-10-CM | POA: Diagnosis not present

## 2020-09-19 NOTE — Progress Notes (Signed)
[name] [name] ([id number]) Visit Report for 09/14/2020 Allergy List Details Patient Name: Date of Service: [name] [name] [name] 09/14/2020 1:15 PM Medical Record Number: [id number] Patient Account Number: [id number] Date of Birth/Sex: Treating RN: 1956-01-09 (65 y.o. [name] Primary Care Pebble Botkin: [name] Other Clinician: Referring Aunya Lemler: Treating Chasya Zenz/Extender: Guy Franco, Malena Weeks in Treatment: 0 Allergies Active Allergies glipizide Severity: Moderate Type: Food ACE Inhibitors Type: Allergen meclizine Type: Food Allergy Notes Electronic Signature(s) Signed: 09/14/2020 5:46:35 PM By: Zandra Abts RN, BSN Entered By: Zandra Abts on 09/14/2020 14:09:34 -------------------------------------------------------------------------------- Arrival Information Details Patient Name: Date of Service: [name] [name], [name] [name] [name] 09/14/2020 1:15 PM Medical Record Number: [id number] Patient Account Number: [id number] Date of Birth/Sex: Treating RN: [birth year] (65 y.o. [name] Primary Care Alzina Golda: [name] Other Clinician: Referring Brayn Eckstein: Treating Orella Cushman/Extender: Guy Franco, Malena Weeks in Treatment: 0 Visit Information Patient Arrived: Ambulatory Arrival Time: 13:57 Accompanied By: husband Transfer Assistance: None Patient Identification Verified: Yes Secondary Verification Process Completed: Yes Patient Requires Transmission-Based Precautions: No Patient Has Alerts: No History Since Last Visit Added or deleted any medications: No Any new allergies or adverse reactions: No Had a fall or experienced change in activities of daily living that may affect risk of falls: No Signs or symptoms of abuse/neglect since last visito No Hospitalized since last visit: No Implantable device outside of the clinic excluding cellular tissue based products placed in the center since last visit: No Has Dressing in Place as Prescribed:  Yes Pain Present Now: No Electronic Signature(s) Signed: 09/14/2020 5:46:35 PM By: Zandra Abts RN, BSN Entered By: Zandra Abts on 09/14/2020 14:00:22 -------------------------------------------------------------------------------- Clinic Level of Care Assessment Details Patient Name: Date of Service: [name] [name], [place] [name] 09/14/2020 1:15 PM Medical Record Number: [id number] Patient Account Number: [id number] Date of Birth/Sex: Treating RN: 1955-09-25 (65 y.o. [name] Primary Care Linna Thebeau: [name] Other Clinician: Referring [place] Vandergriff: Treating Ashraf Mesta/Extender: Guy Franco, Malena Weeks in Treatment: 0 Clinic Level of Care Assessment Items TOOL 1 Quantity Score []  - 0 Use when EandM and Procedure is performed on INITIAL visit ASSESSMENTS - Nursing Assessment / Reassessment X- 1 20 General Physical Exam (combine w/ comprehensive assessment (listed just below) when performed on new pt. evals) X- 1 25 Comprehensive Assessment (HX, ROS, Risk Assessments, Wounds Hx, etc.) ASSESSMENTS - Wound and Skin Assessment / Reassessment []  - 0 Dermatologic / Skin Assessment (not related to wound area) ASSESSMENTS - Ostomy and/or Continence Assessment and Care []  - 0 Incontinence Assessment and Management []  - 0 Ostomy Care Assessment and Management (repouching, etc.) PROCESS - Coordination of Care X - Simple Patient / Family Education for ongoing care 1 15 []  - 0 Complex (extensive) Patient / Family Education for ongoing care X- 1 10 Staff obtains Chiropractor, Records, T Results / Process Orders est []  - 0 Staff telephones HHA, Nursing Homes / Clarify orders / etc []  - 0 Routine Transfer to another Facility (non-emergent condition) []  - 0 Routine Hospital Admission (non-emergent condition) X- 1 15 New Admissions / Manufacturing engineer / Ordering NPWT Apligraf, etc. , []  - 0 Emergency Hospital Admission (emergent condition) PROCESS - Special Needs []  -  0 Pediatric / Minor Patient Management []  - 0 Isolation Patient Management []  - 0 Hearing / Language / Visual special needs []  - 0 Assessment of Community assistance (transportation, D/C planning, etc.) []  - 0 Additional assistance / Altered mentation []  - 0 Support Surface(s) Assessment (bed, cushion, seat,  etc.) INTERVENTIONS - Miscellaneous []  - 0 External ear exam []  - 0 Patient Transfer (multiple staff / / Similar devices) []  - 0 Simple Staple / Suture removal (25 or less) []  - 0 Complex Staple / Suture removal (26 or more) []  - 0 Hypo/Hyperglycemic Management (do not check if billed separately) X- 1 15 Ankle / Brachial Index (ABI) - do not check if billed separately Has the patient been seen at the hospital within the last three years: Yes Total Score: 100 Level Of Care: New/Established - Level 3 Electronic Signature(s) Signed: 09/14/2020 5:50:42 PM By: Nurse, adult RN, BSN Entered By: on 09/14/2020 15:01:37 -------------------------------------------------------------------------------- Encounter Discharge Information Details Patient Name: Date of Service: [name] [name], [name] [name] [name] 09/14/2020 1:15 PM Medical Record Number: Zenaida Deed Patient Account Number: Zenaida Deed Date of Birth/Sex: Treating RN: [birth year] (65 y.o. 11/12/2020, [name] Primary Care Nassim Cosma: [id number] Other Clinician: Referring Emersen Carroll: Treating Juliani Laduke/Extender: [id number], Malena Weeks in Treatment: 0 Encounter Discharge Information Items Post Procedure Vitals Discharge Condition: Stable Temperature (F): 97.4 Ambulatory Status: Ambulatory Pulse (bpm): 74 Discharge Destination: Home Respiratory Rate (breaths/min): 17 Transportation: Private Auto Blood Pressure (mmHg): 174/74 Accompanied By: self Schedule Follow-up Appointment: Yes Clinical Summary of Care: Patient Declined Electronic Signature(s) Signed: 09/19/2020 9:09:54 AM By: 71  RN Entered By: [name] on 09/14/2020 17:07:38 -------------------------------------------------------------------------------- Lower Extremity Assessment Details Patient Name: Date of Service: [name], [name] [name] [name] 09/14/2020 1:15 PM Medical Record Number: 11/17/2020 Patient Account Number: Fonnie Mu Date of Birth/Sex: Treating RN: 07/19/1956 (65 y.o. 11/12/2020 Primary Care Rogelio Winbush: [id number] Other Clinician: Referring Samad Thon: Treating Demeisha Geraghty/Extender: [id number], Malena Weeks in Treatment: 0 Edema Assessment Assessed: [Left: No] [Right: No] Edema: [Left: N] [Right: o] Calf Left: Right: Point of Measurement: 28 cm From Medial Instep 37.5 cm Ankle Left: Right: Point of Measurement: 10 cm From Medial Instep 20.5 cm Vascular Assessment Pulses: Dorsalis Pedis Palpable: [Right:Yes] Blood Pressure: Brachial: [Right:160] Ankle: [Right:Dorsalis Pedis: 200 1.25] Electronic Signature(s) Signed: 09/14/2020 5:46:35 PM By: 71 RN, BSN Entered By: [name] on 09/14/2020 14:19:09 -------------------------------------------------------------------------------- Multi-Disciplinary Care Plan Details Patient Name: Date of Service: Guy Franco, [name] [name] [name] 09/14/2020 1:15 PM Medical Record Number: Zandra Abts Patient Account Number: Zandra Abts Date of Birth/Sex: Treating RN: January 17, 1956 (65 y.o. 11/12/2020 Primary Care Arly Salminen: [id number] Other Clinician: Referring Yohannes Waibel: Treating Nori Winegar/Extender: [id number], Malena Weeks in Treatment: 0 Active Inactive Nutrition Nursing Diagnoses: Impaired glucose control: actual or potential Potential for alteratiion in Nutrition/Potential for imbalanced nutrition Goals: Patient/caregiver will maintain therapeutic glucose control Date Initiated: 09/14/2020 Target Resolution Date: 10/12/2020 Goal Status: Active Interventions: Assess HgA1c results as ordered upon admission and as  needed Assess patient nutrition upon admission and as needed per policy Treatment Activities: Patient referred to Primary Care Physician for further nutritional evaluation : 09/14/2020 Notes: Wound/Skin Impairment Nursing Diagnoses: Impaired tissue integrity Knowledge deficit related to ulceration/compromised skin integrity Goals: Patient/caregiver will verbalize understanding of skin care regimen Date Initiated: 09/14/2020 Target Resolution Date: 10/12/2020 Goal Status: Active Ulcer/skin breakdown will have a volume reduction of 30% by week 4 Date Initiated: 09/14/2020 Target Resolution Date: 10/12/2020 Goal Status: Active Interventions: Assess patient/caregiver ability to obtain necessary supplies Assess patient/caregiver ability to perform ulcer/skin care regimen upon admission and as needed Assess ulceration(s) every visit Provide education on ulcer and skin care Treatment Activities: Skin care regimen initiated : 09/14/2020 Topical wound management initiated : 09/14/2020 Notes: Electronic Signature(s) Signed: 09/14/2020 5:50:42  PM By: Zenaida Deed RN, BSN Entered By: Zenaida Deed on 09/14/2020 15:00:16 -------------------------------------------------------------------------------- Pain Assessment Details Patient Name: Date of Service: [place], [place] [name] 09/14/2020 1:15 PM Medical Record Number: [id number] Patient Account Number: [id number] Date of Birth/Sex: Treating RN: 06/24/1956 (65 y.o. [name] Primary Care Timothy Townsel: [name] Other Clinician: Referring Lataunya Ruud: Treating Savvy Peeters/Extender: Guy Franco, Malena Weeks in Treatment: 0 Active Problems Location of Pain Severity and Description of Pain Patient Has Paino No Site Locations Pain Management and Medication Current Pain Management: Electronic Signature(s) Signed: 09/14/2020 5:46:35 PM By: Zandra Abts RN, BSN Entered By: Zandra Abts on 09/14/2020  14:22:05 -------------------------------------------------------------------------------- Patient/Caregiver Education Details Patient Name: Date of Service: [name] [name] [name] 2/2/2022andnbsp1:15 PM Medical Record Number: [id number] Patient Account Number: [id number] Date of Birth/Gender: Treating RN: Apr 02, 1956 (65 y.o. [name] Primary Care Physician: [name] Other Clinician: Referring Physician: Treating Physician/Extender: Guy Franco, Malena Weeks in Treatment: 0 Education Assessment Education Provided To: Patient Education Topics Provided Welcome [facility]: o Handouts: Welcome [facility] o Methods: Explain/Verbal Responses: Reinforcements needed, State content correctly Wound/Skin Impairment: Handouts: Caring for Your Ulcer, Skin Care Do's and Dont's Methods: Explain/Verbal Responses: Reinforcements needed, State content correctly Electronic Signature(s) Signed: 09/14/2020 5:50:42 PM By: Zenaida Deed RN, BSN Entered By: Zenaida Deed on 09/14/2020 15:00:57 -------------------------------------------------------------------------------- Wound Assessment Details Patient Name: Date of Service: [name], [name] [name] [name] 09/14/2020 1:15 PM Medical Record Number: [id number] Patient Account Number: [id number] Date of Birth/Sex: Treating RN: 06-18-1956 (65 y.o. [name] Primary Care Ardyn Forge: [name] Other Clinician: Referring Durante Violett: Treating Tige Meas/Extender: Guy Franco, Malena Weeks in Treatment: 0 Wound Status Wound Number: 2 Primary Etiology: Diabetic Wound/Ulcer of the Lower Extremity Wound Location: Right T Great oe Wound Status: Open Wounding Event: Gradually Appeared Comorbid History: Anemia, Hypertension, Type II Diabetes, Neuropathy Date Acquired: 05/08/2020 Weeks Of Treatment: 0 Clustered Wound: No Wound Measurements Length: (cm) 0.4 Width: (cm) 0.5 Depth: (cm) 0.2 Area:  (cm) 0.157 Volume: (cm) 0.031 % Reduction in Area: % Reduction in Volume: Epithelialization: None Tunneling: No Undermining: No Wound Description Classification: Grade 2 Wound Margin: Flat and Intact Exudate Amount: Medium Exudate Type: Serosanguineous Exudate Color: red, brown Foul Odor After Cleansing: No Slough/Fibrino Yes Wound Bed Granulation Amount: Large (67-100%) Exposed Structure Granulation Quality: Pink, Pale Fascia Exposed: No Necrotic Amount: Small (1-33%) Fat Layer (Subcutaneous Tissue) Exposed: Yes Necrotic Quality: Adherent Slough Tendon Exposed: No Muscle Exposed: No Joint Exposed: No Bone Exposed: No Treatment Notes Wound #2 (Toe Great) Wound Laterality: Right Cleanser Peri-Wound Care Topical Primary Dressing Promogran Prisma Matrix, 4.34 (sq in) (silver collagen) Discharge Instruction: Moisten collagen with saline or hydrogel Secondary Dressing Woven Gauze Sponge, Non-Sterile 4x4 in Discharge Instruction: roll up and plce under base of great toe to lift tip of toe Woven Gauze Sponges 2x2 in Discharge Instruction: Apply over primary dressing as directed. Secured With Conforming Stretch Gauze Bandage, Sterile 2x75 (in/in) Discharge Instruction: Secure with stretch gauze as directed. [employer] Self Adherent Cohesive Bandage, Non-Sterile,1x5 (in/yd) Compression Wrap Compression Stockings Add-Ons Electronic Signature(s) Signed: 09/14/2020 5:46:35 PM By: Zandra Abts RN, BSN Entered By: Zandra Abts on 09/14/2020 14:21:52 -------------------------------------------------------------------------------- Vitals Details Patient Name: Date of Service: [name], [name] [name] [name] 09/14/2020 1:15 PM Medical Record Number: [id number] Patient Account Number: [id number] Date of Birth/Sex: Treating RN: [birth year] (65 y.o. [name] Primary Care Hykeem Ojeda: [name] Other Clinician: Referring Gail Vendetti: Treating Araya Roel/Extender: Linwood Dibbles,  Lollie Marrow, Malena Weeks in Treatment: 0 Vital Signs Time Taken: 14:00 Temperature (F): 97.9 Height (in): 62 Pulse (bpm): 72 Source: Stated Respiratory Rate (breaths/min): 16 Weight (lbs): 169 Blood Pressure (mmHg): 160/71 Source: Stated Capillary Blood Glucose (mg/dl): 144 Body Mass Index (BMI): 30.9 Reference Range: 80 - 120 mg / dl Notes glucose per pt report Electronic Signature(s) Signed: 09/14/2020 5:46:35 PM By: Zandra Abts RN, BSN Entered By: Zandra Abts on 09/14/2020 14:01:13

## 2020-09-20 ENCOUNTER — Encounter (HOSPITAL_BASED_OUTPATIENT_CLINIC_OR_DEPARTMENT_OTHER): Payer: BC Managed Care – PPO | Admitting: Internal Medicine

## 2020-09-20 ENCOUNTER — Ambulatory Visit (HOSPITAL_COMMUNITY)
Admission: RE | Admit: 2020-09-20 | Discharge: 2020-09-20 | Disposition: A | Discharge status: Home / Self Care | Payer: BC Managed Care – PPO | Source: Ambulatory Visit | Attending: Internal Medicine | Admitting: Internal Medicine

## 2020-09-20 ENCOUNTER — Other Ambulatory Visit (HOSPITAL_COMMUNITY): Payer: Self-pay | Admitting: Internal Medicine

## 2020-09-20 ENCOUNTER — Other Ambulatory Visit: Payer: Self-pay

## 2020-09-20 DIAGNOSIS — E11621 Type 2 diabetes mellitus with foot ulcer: Secondary | ICD-10-CM | POA: Diagnosis not present

## 2020-09-20 DIAGNOSIS — E1142 Type 2 diabetes mellitus with diabetic polyneuropathy: Secondary | ICD-10-CM | POA: Diagnosis not present

## 2020-09-20 DIAGNOSIS — I10 Essential (primary) hypertension: Secondary | ICD-10-CM | POA: Diagnosis not present

## 2020-09-20 DIAGNOSIS — L97519 Non-pressure chronic ulcer of other part of right foot with unspecified severity: Secondary | ICD-10-CM | POA: Diagnosis not present

## 2020-09-20 DIAGNOSIS — E11319 Type 2 diabetes mellitus with unspecified diabetic retinopathy without macular edema: Secondary | ICD-10-CM | POA: Diagnosis not present

## 2020-09-20 DIAGNOSIS — L97512 Non-pressure chronic ulcer of other part of right foot with fat layer exposed: Secondary | ICD-10-CM | POA: Diagnosis not present

## 2020-09-20 NOTE — Progress Notes (Signed)
[name] [name], [name] [name] ([id number]) Visit Report for 09/20/2020 Arrival Information Details Patient Name: Date of Service: [place], [place] [name] 09/20/2020 7:30 A M Medical Record Number: [id number] Patient Account Number: [id number] Date of Birth/Sex: Treating RN: [birth year] (65 y.o. [name], [name] Primary Care Demetress Tift: [name] Other Clinician: Referring Neave Lenger: Treating Breniya Goertzen/Extender: Kristopher Glee, Malena Weeks in Treatment: 0 Visit Information History Since Last Visit Added or deleted any medications: No Patient Arrived: Ambulatory Any new allergies or adverse reactions: No Arrival Time: 07:43 Had a fall or experienced change in No Accompanied By: husband activities of daily living that may affect Transfer Assistance: None risk of falls: Patient Identification Verified: Yes Signs or symptoms of abuse/neglect since last visito No Secondary Verification Process Completed: Yes Hospitalized since last visit: No Patient Requires Transmission-Based Precautions: No Implantable device outside of the clinic excluding No Patient Has Alerts: No cellular tissue based products placed in the center since last visit: Has Dressing in Place as Prescribed: Yes Pain Present Now: No Electronic Signature(s) Signed: 09/20/2020 10:38:58 AM By: Karl Ito Entered By: Karl Ito on 09/20/2020 07:45:46 -------------------------------------------------------------------------------- Encounter Discharge Information Details Patient Name: Date of Service: [name], [name] 09/20/2020 7:30 A M Medical Record Number: [id number] Patient Account Number: [id number] Date of Birth/Sex: Treating RN: [birth year] (65 y.o. [name] Primary Care Shayden Bobier: [name] Other Clinician: Referring Khyron Garno: Treating Shawan Corella/Extender: Kristopher Glee, Malena Weeks in Treatment: 0 Encounter Discharge Information Items Post Procedure Vitals Discharge Condition:  Stable Temperature (F): 98.5 Ambulatory Status: Ambulatory Pulse (bpm): 74 Discharge Destination: Home Respiratory Rate (breaths/min): 16 Transportation: Private Auto Blood Pressure (mmHg): 153/67 Accompanied By: husband Schedule Follow-up Appointment: Yes Clinical Summary of Care: Patient Declined Electronic Signature(s) Signed: 09/20/2020 5:04:56 PM By: Zandra Abts RN, BSN Entered By: Zandra Abts on 09/20/2020 08:38:56 -------------------------------------------------------------------------------- Lower Extremity Assessment Details Patient Name: Date of Service: [name], [name] 09/20/2020 7:30 A M Medical Record Number: [id number] Patient Account Number: [id number] Date of Birth/Sex: Treating RN: October 08, 1955 (65 y.o. [name] Primary Care Ahmadou Bolz: [name] Other Clinician: Referring Novelle Addair: Treating Juquan Reznick/Extender: Kristopher Glee, Malena Weeks in Treatment: 0 Edema Assessment Assessed: [Left: No] [Right: No] Edema: [Left: N] [Right: o] Calf Left: Right: Point of Measurement: 28 cm From Medial Instep 37.5 cm Ankle Left: Right: Point of Measurement: 10 cm From Medial Instep 20.5 cm Vascular Assessment Pulses: Dorsalis Pedis Palpable: [Right:Yes] Electronic Signature(s) Signed: 09/20/2020 5:13:50 PM By: Zenaida Deed RN, BSN Entered By: Zenaida Deed on 09/20/2020 07:56:13 -------------------------------------------------------------------------------- Multi Wound Chart Details Patient Name: Date of Service: [name], [name] 09/20/2020 7:30 A M Medical Record Number: [id number] Patient Account Number: [id number] Date of Birth/Sex: Treating RN: 1956-07-03 (65 y.o. [name], [name] Primary Care Nassir Neidert: [name] Other Clinician: Referring Rosenda Geffrard: Treating Kynedi Profitt/Extender: Kristopher Glee, Malena Weeks in Treatment: 0 Vital Signs Height(in): 62 Pulse(bpm): 74 Weight(lbs): 169 Blood Pressure(mmHg):  153/67 Body Mass Index(BMI): 31 Temperature(F): 98.5 Respiratory Rate(breaths/min): 16 Photos: [2:No Photos Right T Great oe] [N/A:N/A N/A] Wound Location: [2:Gradually Appeared] [N/A:N/A] Wounding Event: [2:Diabetic Wound/Ulcer of the Lower] [N/A:N/A] Primary Etiology: [2:Extremity Anemia, Hypertension, Type II] [N/A:N/A] Comorbid History: [2:Diabetes, Neuropathy 05/08/2020] [N/A:N/A] Date Acquired: [2:0] [N/A:N/A] Weeks of Treatment: [2:Open] [N/A:N/A] Wound Status: [2:0.5x0.5x0.3] [N/A:N/A] Measurements L x W x D (cm) [2:0.196] [N/A:N/A] A (cm) : rea [2:0.059] [N/A:N/A] Volume (cm) : [2:-24.80%] [N/A:N/A] % Reduction in A rea: [2:-90.30%] [N/A:N/A] % Reduction in Volume: [2:2] Position 1 (o'clock): [2:1.1] Maximum Distance 1 (cm): [2:Yes] [  N/A:N/A] Tunneling: [2:Grade 2] [N/A:N/A] Classification: [2:Small] [N/A:N/A] Exudate A mount: [2:Purulent] [N/A:N/A] Exudate Type: [2:yellow, brown, green] [N/A:N/A] Exudate Color: [2:Flat and Intact] [N/A:N/A] Wound Margin: [2:None Present (0%)] [N/A:N/A] Granulation A mount: [2:Large (67-100%)] [N/A:N/A] Necrotic A mount: [2:Fat Layer (Subcutaneous Tissue): Yes N/A] Exposed Structures: [2:Fascia: No Tendon: No Muscle: No Joint: No Bone: No None] [N/A:N/A] Epithelialization: [2:Debridement - Excisional] [N/A:N/A] Debridement: Pre-procedure Verification/Time Out 08:06 [N/A:N/A] Taken: [2:Lidocaine] [N/A:N/A] Pain Control: [2:Callus, Subcutaneous] [N/A:N/A] Tissue Debrided: [2:Skin/Subcutaneous Tissue] [N/A:N/A] Level: [2:0.25] [N/A:N/A] Debridement A (sq cm): [2:rea Blade, Forceps] [N/A:N/A] Instrument: [2:Minimum] [N/A:N/A] Bleeding: [2:Pressure] [N/A:N/A] Hemostasis A chieved: [2:0] [N/A:N/A] Procedural Pain: [2:0] [N/A:N/A] Post Procedural Pain: [2:Procedure was tolerated well] [N/A:N/A] Debridement Treatment Response: [2:0.5x0.5x0.3] [N/A:N/A] Post Debridement Measurements L x W x D (cm) [2:0.059] [N/A:N/A] Post Debridement  Volume: (cm) [2:Debridement] [N/A:N/A] Treatment Notes Electronic Signature(s) Signed: 09/20/2020 4:37:05 PM By: Baltazar Najjar MD Signed: 09/20/2020 5:05:22 PM By: Fonnie Mu RN Entered By: Baltazar Najjar on 09/20/2020 08:19:44 -------------------------------------------------------------------------------- Multi-Disciplinary Care Plan Details Patient Name: Date of Service: [facility], [name] 09/20/2020 7:30 A M Medical Record Number: [id number] Patient Account Number: [id number] Date of Birth/Sex: Treating RN: 28-Dec-1955 (65 y.o. [name], [name] Primary Care Dequante Tremaine: [name] Other Clinician: Referring Markez Dowland: Treating Rateel Beldin/Extender: Kristopher Glee, Malena Weeks in Treatment: 0 Active Inactive Nutrition Nursing Diagnoses: Impaired glucose control: actual or potential Potential for alteratiion in Nutrition/Potential for imbalanced nutrition Goals: Patient/caregiver will maintain therapeutic glucose control Date Initiated: 09/14/2020 Target Resolution Date: 10/12/2020 Goal Status: Active Interventions: Assess HgA1c results as ordered upon admission and as needed Assess patient nutrition upon admission and as needed per policy Treatment Activities: Patient referred to Primary Care Physician for further nutritional evaluation : 09/14/2020 Notes: Wound/Skin Impairment Nursing Diagnoses: Impaired tissue integrity Knowledge deficit related to ulceration/compromised skin integrity Goals: Patient/caregiver will verbalize understanding of skin care regimen Date Initiated: 09/14/2020 Target Resolution Date: 10/12/2020 Goal Status: Active Ulcer/skin breakdown will have a volume reduction of 30% by week 4 Date Initiated: 09/14/2020 Target Resolution Date: 10/12/2020 Goal Status: Active Interventions: Assess patient/caregiver ability to obtain necessary supplies Assess patient/caregiver ability to perform ulcer/skin care regimen upon admission and as  needed Assess ulceration(s) every visit Provide education on ulcer and skin care Treatment Activities: Skin care regimen initiated : 09/14/2020 Topical wound management initiated : 09/14/2020 Notes: Electronic Signature(s) Signed: 09/20/2020 5:05:22 PM By: Fonnie Mu RN Entered By: Fonnie Mu on 09/20/2020 07:42:02 -------------------------------------------------------------------------------- Pain Assessment Details Patient Name: Date of Service: [name], [name] 09/20/2020 7:30 A M Medical Record Number: [id number] Patient Account Number: [id number] Date of Birth/Sex: Treating RN: 05/17/1956 (65 y.o. [name], [name] Primary Care Lavern Crimi: [name] Other Clinician: Referring Azizi Bally: Treating Vickey Ewbank/Extender: Kristopher Glee, Malena Weeks in Treatment: 0 Active Problems Location of Pain Severity and Description of Pain Patient Has Paino No Site Locations Pain Management and Medication Current Pain Management: Electronic Signature(s) Signed: 09/20/2020 10:38:58 AM By: Karl Ito Signed: 09/20/2020 5:05:22 PM By: Fonnie Mu RN Entered By: Karl Ito on 09/20/2020 07:46:14 -------------------------------------------------------------------------------- Patient/Caregiver Education Details Patient Name: Date of Service: Emmie Niemann [name] 2/8/2022andnbsp7:30 A M Medical Record Number: [id number] Patient Account Number: [id number] Date of Birth/Gender: Treating RN: 06-Aug-1956 (65 y.o. Toniann Fail Primary Care Physician: [name] Other Clinician: Referring Physician: Treating Physician/Extender: Kristopher Glee, Brooks Sailors in Treatment: 0 Education Assessment Education Provided To: Patient Education Topics Provided Basic Hygiene: Methods: Explain/Verbal Responses: State content correctly Wound/Skin Impairment: Methods: Explain/Verbal Responses: State content correctly  Electronic Signature(s) Signed:  09/20/2020 5:05:22 PM By: Fonnie Mu RN Entered By: Fonnie Mu on 09/20/2020 07:42:24 -------------------------------------------------------------------------------- Wound Assessment Details Patient Name: Date of Service: [name], [name] 09/20/2020 7:30 A M Medical Record Number: [id number] Patient Account Number: [id number] Date of Birth/Sex: Treating RN: 12-Aug-1956 (65 y.o. Billy Coast, Linda Primary Care Caydance Kuehnle: [name] Other Clinician: Referring Taylor Spilde: Treating Vittoria Noreen/Extender: Kristopher Glee, Malena Weeks in Treatment: 0 Wound Status Wound Number: 2 Primary Etiology: Diabetic Wound/Ulcer of the Lower Extremity Wound Location: Right T Great oe Wound Status: Open Wounding Event: Gradually Appeared Comorbid History: Anemia, Hypertension, Type II Diabetes, Neuropathy Date Acquired: 05/08/2020 Weeks Of Treatment: 0 Clustered Wound: No Wound Measurements Length: (cm) 0.5 Width: (cm) 0.5 Depth: (cm) 0.3 Area: (cm) 0.196 Volume: (cm) 0.059 % Reduction in Area: -24.8% % Reduction in Volume: -90.3% Epithelialization: None Tunneling: Yes Position (o'clock): 2 Maximum Distance: (cm) 1.1 Undermining: No Wound Description Classification: Grade 2 Wound Margin: Flat and Intact Exudate Amount: Small Exudate Type: Purulent Exudate Color: yellow, brown, green Foul Odor After Cleansing: No Slough/Fibrino Yes Wound Bed Granulation Amount: None Present (0%) Exposed Structure Necrotic Amount: Large (67-100%) Fascia Exposed: No Necrotic Quality: Adherent Slough Fat Layer (Subcutaneous Tissue) Exposed: Yes Tendon Exposed: No Muscle Exposed: No Joint Exposed: No Bone Exposed: No Treatment Notes Wound #2 (Toe Great) Wound Laterality: Right Cleanser Soap and Water Discharge Instruction: May shower and wash wound with dial antibacterial soap and water prior to dressing change. Wound Cleanser Discharge Instruction: Cleanse the wound with wound  cleanser prior to applying a clean dressing using gauze sponges, not tissue or cotton balls. Peri-Wound Care Topical Primary Dressing Promogran Prisma Matrix, 4.34 (sq in) (silver collagen) Discharge Instruction: Moisten collagen with saline or hydrogel Secondary Dressing Woven Gauze Sponge, Non-Sterile 4x4 in Discharge Instruction: roll up and plce under base of great toe to lift tip of toe Woven Gauze Sponges 2x2 in Discharge Instruction: Apply over primary dressing as directed. Secured With Conforming Stretch Gauze Bandage, Sterile 2x75 (in/in) Discharge Instruction: Secure with stretch gauze as directed. [employer] Self Adherent Cohesive Bandage, Non-Sterile,1x5 (in/yd) Compression Wrap Compression Stockings Add-Ons Electronic Signature(s) Signed: 09/20/2020 5:13:50 PM By: Zenaida Deed RN, BSN Entered By: Zenaida Deed on 09/20/2020 07:57:39 -------------------------------------------------------------------------------- Vitals Details Patient Name: Date of Service: [name], [name] 09/20/2020 7:30 A M Medical Record Number: [id number] Patient Account Number: [id number] Date of Birth/Sex: Treating RN: Jul 07, 1956 (65 y.o. [name], [name] Primary Care Davy Westmoreland: [name] Other Clinician: Referring Dawan Farney: Treating Khristine Verno/Extender: Kristopher Glee, Malena Weeks in Treatment: 0 Vital Signs Time Taken: 07:45 Temperature (F): 98.5 Height (in): 62 Pulse (bpm): 74 Weight (lbs): 169 Respiratory Rate (breaths/min): 16 Body Mass Index (BMI): 30.9 Blood Pressure (mmHg): 153/67 Reference Range: 80 - 120 mg / dl Electronic Signature(s) Signed: 09/20/2020 10:38:58 AM By: Karl Ito Entered By: Karl Ito on 09/20/2020 07:46:08

## 2020-09-20 NOTE — Progress Notes (Signed)
[name], [name] ([id number]) Visit Report for 09/20/2020 Debridement Details Patient Name: Date of Service: [name], [name] 09/20/2020 7:30 A M Medical Record Number: [id number] Patient Account Number: [id number] Date of Birth/Sex: Treating RN: 08-14-1955 (65 y.o. [name] [name], [name] [name] Primary Care Provider: Elvia Collum Other Clinician: Referring Provider: Treating Provider/Extender: Melody Haver, Malena Weeks in Treatment: 0 Debridement Performed for Assessment: Wound #2 Right T Great oe Performed By: Physician Ricard Dillon., MD Debridement Type: Debridement Severity of Tissue Pre Debridement: Fat layer exposed Level of Consciousness (Pre-procedure): Awake and Alert Pre-procedure Verification/Time Out Yes - 08:06 Taken: Start Time: 08:06 Pain Control: Lidocaine T Area Debrided (L x W): otal 0.5 (cm) x 0.5 (cm) = 0.25 (cm) Tissue and other material debrided: Viable, Non-Viable, Callus, Subcutaneous, Skin: Dermis , Skin: Epidermis Level: Skin/Subcutaneous Tissue Debridement Description: Excisional Instrument: Blade, Forceps Specimen: Tissue Culture Number of Specimens T aken: 1 Bleeding: Minimum Hemostasis Achieved: Pressure End Time: 08:07 Procedural Pain: 0 Post Procedural Pain: 0 Response to Treatment: Procedure was tolerated well Level of Consciousness (Post- Awake and Alert procedure): Post Debridement Measurements of Total Wound Length: (cm) 0.5 Width: (cm) 0.5 Depth: (cm) 0.3 Volume: (cm) 0.059 Character of Wound/Ulcer Post Debridement: Improved Severity of Tissue Post Debridement: Fat layer exposed Post Procedure Diagnosis Same as Pre-procedure Electronic Signature(s) Signed: 09/20/2020 4:37:05 PM By: Linton Ham MD Signed: 09/20/2020 5:05:22 PM By: Rhae Hammock RN Entered By: Linton Ham on 09/20/2020 08:19:56 -------------------------------------------------------------------------------- HPI Details Patient Name: Date of  Service: [name] [name], [name] [name] 09/20/2020 7:30 A M Medical Record Number: [id number] Patient Account Number: [id number] Date of Birth/Sex: Treating RN: 1955/09/21 (65 y.o. [name] [name], [name] [name] Primary Care Provider: Elvia Collum Other Clinician: Referring Provider: Treating Provider/Extender: Melody Haver, Malena Weeks in Treatment: 0 History of Present Illness HPI Description: 65 year old female referred to [place] by her PCP Dr. Delrae Rend, with a past medical history of type 2 diabetes mellitus with diabetic polyneuropathy, diabetic retinopathy, hyperglycemia, fracture of right great toe, diabetic foot ulcer. she has never been a smoker. The patient has had a lower extremity arterial study done in April of this year and the right ABI was 1.27 and the left ABI was 1.14. The right toe brachial index was 0.60 and the left was 0.59. The impression was no evidence of hemodynamically significant lower extremity arterial occlusive disease through the ankles bilaterally. Decreased pulse volume recordings of the left great toe suggests distal small vessel disease. note is made of the fact that the patient was seen in early April of this year in the ED with what was thought to be a cellulitis and was put on doxycycline and was asked to follow-up with her PCP. x-ray done at that stage revealed -- IMPRESSION:There is a corner fracture at medial base of distal phalanx great toe. Clinical correlation is necessary to exclude recurrent fracture or nonunion of previous fracture. There is diffuse soft tissue swelling great toe. The patient said she had some back surgery in 2015 which led to what sounds like a foot drop and she has been having problems raising her foot and walking well and the right big toe was injured and fractured a while ago. She also mentioned she does not take very good care of her diabetes diet. 01/24/2016 -- right foot x-ray -- IMPRESSION:There is healing corner fracture at the medial  base of distal phalanx great toe with partial union. There is soft tissue swelling right great toe. On oblique view there is subtle  cortical irregularity distal medial aspect of proximal phalanx great toe. Osteomyelitis cannot be excluded. Clinical correlation is necessary.Further correlation with MRI is recommended as clinically warranted. In view of the above findings we will order a MRI, which has not been done yet and see her back next week with this result. 01/31/2016 - MRI of the right foot -- IMPRESSION:1. Diffuse edema and enhancement of the entire distal phalanx of the great toe within the adjacent soft tissue ulceration and soft tissue devitalization. I suspect this represents early diffuse osteomyelitis. 2. No evidence of osteomyelitis of the proximal phalanx of the great toe. 3. Probable cellulitis along the lateral aspect of the first metatarsal. 02/07/2016 -- she is still awaiting insurance clearance to start her hyperbaric oxygen therapy and the appointment to see Dr. Mayo Ao. 03/24/2016 -- the patient has been continuing with hyperbaric oxygen therapy and has finished 17 treatments. She has a infectious disease consult with Dr. Linus Salmons tomorrow She was seen by Dr. Mayo Ao on 03/09/2016 and his plan was to start the patient on clindamycin and treat her for osteomyelitis. So far the patient has been on oral antibiotics The pathology was negative for osteomyelitis however the bone culture was positive. We will get this report from Dr. Pasty Arch office. Addendum : we have received the reports from Dr. Pasty Arch office and the pathology showed benign bone with new bone formation and no evidence of significant inflammation. Culture report was a Beta Hemolytic Streptococcus which was sensitive penicillins and other beta lactams and rare Staphylococcus aureus, MSSA, which was sensitive to tetracycline and clindamycin and Bactrim ciprofloxacin and oxacillin. 03/28/2016 -- was seen  by Dr. Scharlene Gloss who reviewed her history and physical and x-rays and for the culture positive bone biopsy with negative pathology for osteomyelitis he did not recommend long-term IV antibiotics. T treat her orally for 21 days and because it was pansensitive recommended o Keflex 500 mg 4 times a day for 21 days. He would see her back in 4 weeks to recheck an ESR and CRP and if worse would consider IV antibiotics at that time. 04/04/2016 -- she dropped a sharp object on her left dorsum of the foot a couple of days ago but there was a superficial abrasion and no deep open wound. 04/18/2016 -- she was seen by Dr. Berton Bon of infectious disease who plan to stop antibiotics and observe and check her CRP and ESR and she would follow when necessary for concerns. the sedimentation rate was 4 mm per hour, and the CRP was less than 0.5 mg per DL she was also seen by Dr. Mayo Ao who did an x-ray of the right foot but his notes and report is not available yet. 04/25/2016 -- I have reviewed the notes from Dr. Mayo Ao who saw her on 04/13/2016. no x-rays were taken in follow-up. The patient was asked to continue with care at the wound center. 05/09/2016 -- she was reviewed by Dr. Mayo Ao on Monday and he has discussed getting her diabetic shoes made. Other than that no changes were recommended. 05/15/16 patient arrives today with the wound on the tip of her great toe measuring at 0.6 cm. This probes down to bony surface. From what I understand this is a deterioration from last visit. She is using Prisma and changing this daily at home she has an offloading boot. 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 She cannot wear an offloading boot 05/24/16; using Prisma. We are making attempts to  more aggressively offload the right great toe which is flexed at the interphalangeal joint by supporting this and keeping it forward in her footwear. 06/13/2016 -- the patient has had no discharge tenderness or cellulitis of the right great toe. Was seen by Dr. Mayo Ao on 06/08/2016 and an x-ray was done by him which showed there was some cortical changes at the distal aspect of the hallux but there was no extension or worsening of osteomyelitis and there was no soft tissue emphysema. He had made arrangements for her diabetic shoes but was awaiting insurance clearance. Readmission: 09/14/2020 upon evaluation today patient appears to be doing somewhat poorly in regard to her right great toe ulcer. She tells me currently that she has been having issues with this since September 2021. She has been seen by Dr. Earleen Newport. With that being said I did review the notes. It does appear that the patient has some issue with her great toe some contraction causes this to plantar flex which unfortunately makes the end of the toe actually rub on any shoe she wears. She has a PEG assist offloading shoe but unfortunately she is worn a hole while her big toe is which means that when she wears this her toe does fix right down until it and then continues to wrap around. Nonetheless this is not optimal and in fact is not even going to be beneficial for her. She did have an x- ray on December 23 done by Dr. Earleen Newport there was no evidence of bony destruction to indicate osteomyelitis according to his note. I agree it does not appear to be quite that significant visually. Patient's last hemoglobin A1c stated to be 8.0 in December 2021. She has been on Augmentin as prescribed by Dr. Earleen Newport. She does have a PEG assist offloading shoe but as mentioned above is really not functioning appropriately for her at this point. Again all this started in September and really she tells me has been a frustration for her she still out of  work and that is given her some concern and trouble as well. There is not appear to be any evidence of local or systemic infection. No fevers, chills, nausea, vomiting, or diarrhea. The patient does have hypertension. 2/8; patient admitted to the clinic last week. She is a type II diabetic with peripheral neuropathy. She does not appear to have any vascular issues based on a normal ABI and clinical exam. She has been dealing with a wound on the tip of her right great toe for as long as 5 months. Followed with Dr. Earleen Newport at [facility]. She was using silver collagen although her supplies were delayed and arriving. She arrives today with a probing tunnel going medially subcutaneously from the original wound. She is offloading this in a surgical shoe. She works at [employer] but is off work on short-term disability. [Cannot work with an open toed shoe]. Electronic Signature(s) Signed: 09/20/2020 4:37:05 PM By: Linton Ham MD Entered By: Linton Ham on 09/20/2020 08:22:26 -------------------------------------------------------------------------------- Physical Exam Details Patient Name: Date of Service: [name] [name], [name] [name] 09/20/2020 7:30 A M Medical Record Number: [id number] Patient Account Number: [id number] Date of Birth/Sex: Treating RN: 1956-08-11 (65 y.o. [name] [name], [name] [name] Primary Care Provider: Elvia Collum Other Clinician:  Referring Provider: Treating Provider/Extender: Melody Haver, Malena Weeks in Treatment: 0 Constitutional Patient is hypertensive.. Pulse regular and within target range for patient.Marland Kitchen Respirations regular, non-labored and within target range.. Temperature is normal and within the target range for the patient.Marland Kitchen Appears in no distress. Cardiovascular Pedal pulses are palpable. Notes Wound exam; the patient had a small wound on the tip of her right great toe. The surface of this is not particularly viable probes easily down perhaps the bone. Also  concerning there was a tunnel going medially with a raised area of skin medially on the tip of the toe. I used pickups and a scalpel to remove this also some subcutaneous tissue. Unfortunately there was another probing area in the middle of this second open area this does not go to bone nevertheless I cultured this area because of the concern about the appearance of it. She has diabetic neuropathy and is insensate Electronic Signature(s) Signed: 09/20/2020 4:37:05 PM By: Linton Ham MD Entered By: Linton Ham on 09/20/2020 08:24:11 -------------------------------------------------------------------------------- Physician Orders Details Patient Name: Date of Service: [name] [name], [name] [name] 09/20/2020 7:30 A M Medical Record Number: [id number] Patient Account Number: [id number] Date of Birth/Sex: Treating RN: 05/19/1956 (65 y.o. [name] [name], [name] [name] Primary Care Provider: Elvia Collum Other Clinician: Referring Provider: Treating Provider/Extender: Melody Haver, Malena Weeks in Treatment: 0 Verbal / Phone Orders: No Diagnosis Coding Follow-up Appointments Return Appointment in 1 week. Bathing/ Shower/ Hygiene May shower and wash wound with soap and water. - with dressing changes Off-Loading Open toe surgical shoe to: - right foot with felt pad to offload great toe Wound Treatment Wound #2 - T Great oe Wound Laterality: Right Cleanser: Soap and Water Every Other Day/30 Days Discharge Instructions: May shower and wash wound with dial antibacterial soap and water prior to dressing change. Cleanser: Wound Cleanser Every Other Day/30 Days Discharge Instructions: Cleanse the wound with wound cleanser prior to applying a clean dressing using gauze sponges, not tissue or cotton balls. Prim Dressing: Promogran Prisma Matrix, 4.34 (sq in) (silver collagen) (Dispense As Written) Every Other Day/30 Days ary Discharge Instructions: Moisten collagen with saline or hydrogel Secondary  Dressing: Woven Gauze Sponge, Non-Sterile 4x4 in (Generic) Every Other Day/30 Days Discharge Instructions: roll up and plce under base of great toe to lift tip of toe Secondary Dressing: Woven Gauze Sponges 2x2 in (Generic) Every Other Day/30 Days Discharge Instructions: Apply over primary dressing as directed. Secured With: Child psychotherapist, Sterile 2x75 (in/in) (Generic) Every Other Day/30 Days Discharge Instructions: Secure with stretch gauze as directed. Secured With: [place] Self Adherent Cohesive Bandage, Non-Sterile,1x5 (in/yd) (Generic) Every Other Day/30 Days Laboratory naerobe culture (MICRO) Bacteria identified in Unspecified specimen by A LOINC Code: 226-3 Convenience Name: Anerobic culture Radiology X-ray, toes - right great toe Patient Medications llergies: glipizide, ACE Inhibitors, meclizine A Notifications Medication Indication Start End wound infection 09/20/2020 Augmentin DOSE oral 875 mg-125 mg tablet - 1 tablet oral bid for 7 days Electronic Signature(s) Signed: 09/20/2020 8:27:58 AM By: Linton Ham MD Entered By: Linton Ham on 09/20/2020 08:27:58 Prescription 09/20/2020 -------------------------------------------------------------------------------- Clarene Reamer MD Patient Name: Provider: Mar 25, 1956 [id number] Date of Birth: NPI#Jesse Sans Sex: DEA #: [phone] [id number] Phone #: License #: [place] Patient Address: [address] [place], [place] [address] [place], [place] [address] [phone] Allergies glipizide; ACE Inhibitors; meclizine Provider's Orders X-ray, toes - right great toe Hand Signature:  Date(s): Electronic Signature(s) Signed: 09/20/2020 4:37:05 PM By: Linton Ham MD Entered By: Linton Ham on 09/20/2020 08:27:59 -------------------------------------------------------------------------------- Problem List  Details Patient Name: Date of Service: [name] [name], [name] [name] 09/20/2020 7:30 A M Medical Record Number: [id number] Patient Account Number: [id number] Date of Birth/Sex: Treating RN: 1956/02/19 (65 y.o. [name] [name], [name] [name] Primary Care Provider: Elvia Collum Other Clinician: Referring Provider: Treating Provider/Extender: Melody Haver, Malena Weeks in Treatment: 0 Active Problems ICD-10 Encounter Code Description Active Date MDM Diagnosis E11.621 Type 2 diabetes mellitus with foot ulcer 09/14/2020 No Yes L97.512 Non-pressure chronic ulcer of other part of right foot with fat layer exposed 09/14/2020 No Yes E11.42 Type 2 diabetes mellitus with diabetic polyneuropathy 09/14/2020 No Yes I10 Essential (primary) hypertension 09/14/2020 No Yes Inactive Problems Resolved Problems Electronic Signature(s) Signed: 09/20/2020 4:37:05 PM By: Linton Ham MD Entered By: Linton Ham on 09/20/2020 08:19:37 -------------------------------------------------------------------------------- Progress Note Details Patient Name: Date of Service: [name] [name], [name] [name] 09/20/2020 7:30 A M Medical Record Number: [id number] Patient Account Number: [id number] Date of Birth/Sex: Treating RN: May 23, 1956 (65 y.o. [name] [name], [name] [name] Primary Care Provider: Elvia Collum Other Clinician: Referring Provider: Treating Provider/Extender: Melody Haver, Malena Weeks in Treatment: 0 Subjective History of Present Illness (HPI) 65 year old female referred to [place] by her PCP Dr. Delrae Rend, with a past medical history of type 2 diabetes mellitus with diabetic polyneuropathy, diabetic retinopathy, hyperglycemia, fracture of right great toe, diabetic foot ulcer. she has never been a smoker. The patient has had a lower extremity arterial study done in April of this year and the right ABI was 1.27 and the left ABI was 1.14. The right toe brachial index was 0.60 and the left was 0.59. The impression was no evidence  of hemodynamically significant lower extremity arterial occlusive disease through the ankles bilaterally. Decreased pulse volume recordings of the left great toe suggests distal small vessel disease. note is made of the fact that the patient was seen in early April of this year in the ED with what was thought to be a cellulitis and was put on doxycycline and was asked to follow-up with her PCP. x-ray done at that stage revealed -- IMPRESSION:There is a corner fracture at medial base of distal phalanx great toe. Clinical correlation is necessary to exclude recurrent fracture or nonunion of previous fracture. There is diffuse soft tissue swelling great toe. The patient said she had some back surgery in 2015 which led to what sounds like a foot drop and she has been having problems raising her foot and walking well and the right big toe was injured and fractured a while ago. She also mentioned she does not take very good care of her diabetes diet. 01/24/2016 -- right foot x-ray -- IMPRESSION:There is healing corner fracture at the medial base of distal phalanx great toe with partial union. There is soft tissue swelling right great toe. On oblique view there is subtle cortical irregularity distal medial aspect of proximal phalanx great toe. Osteomyelitis cannot be excluded. Clinical correlation is necessary.Further correlation with MRI is recommended as clinically warranted. In view of the above findings we will order a MRI, which has not been done yet and see her back next week with this result. 01/31/2016 - MRI of the right foot -- IMPRESSION:1. Diffuse edema and enhancement of the entire distal phalanx of the great toe within the adjacent soft tissue ulceration and soft tissue devitalization. I suspect this represents early diffuse osteomyelitis. 2. No evidence of osteomyelitis of the  proximal phalanx of the great toe. 3. Probable cellulitis along the lateral aspect of the first metatarsal. 02/07/2016  -- she is still awaiting insurance clearance to start her hyperbaric oxygen therapy and the appointment to see Dr. Mayo Ao. 03/24/2016 -- the patient has been continuing with hyperbaric oxygen therapy and has finished 17 treatments. She has a infectious disease consult with Dr. Linus Salmons tomorrow She was seen by Dr. Mayo Ao on 03/09/2016 and his plan was to start the patient on clindamycin and treat her for osteomyelitis. So far the patient has been on oral antibiotics The pathology was negative for osteomyelitis however the bone culture was positive. We will get this report from Dr. Pasty Arch office. Addendum : we have received the reports from Dr. Pasty Arch office and the pathology showed benign bone with new bone formation and no evidence of significant inflammation. Culture report was a Beta Hemolytic Streptococcus which was sensitive penicillins and other beta lactams and rare Staphylococcus aureus, MSSA, which was sensitive to tetracycline and clindamycin and Bactrim ciprofloxacin and oxacillin. 03/28/2016 -- was seen by Dr. Scharlene Gloss who reviewed her history and physical and x-rays and for the culture positive bone biopsy with negative pathology for osteomyelitis he did not recommend long-term IV antibiotics. T treat her orally for 21 days and because it was pansensitive recommended o Keflex 500 mg 4 times a day for 21 days. He would see her back in 4 weeks to recheck an ESR and CRP and if worse would consider IV antibiotics at that time. 04/04/2016 -- she dropped a sharp object on her left dorsum of the foot a couple of days ago but there was a superficial abrasion and no deep open wound. 04/18/2016 -- she was seen by Dr. Berton Bon of infectious disease who plan to stop antibiotics and observe and check her CRP and ESR and she would follow when necessary for concerns. the sedimentation rate was 4 mm per hour, and the CRP was less than 0.5 mg per DL she was also seen by Dr. Mayo Ao who did an x-ray of the right foot but his notes and report is not available yet. 04/25/2016 -- I have reviewed the notes from Dr. Mayo Ao who saw her on 04/13/2016. no x-rays were taken in follow-up. The patient was asked to continue with care at the wound center. 05/09/2016 -- she was reviewed by Dr. Mayo Ao on Monday and he has discussed getting her diabetic shoes made. Other than that no changes were recommended. 05/15/16 patient arrives today with the wound on the tip of her great toe measuring at 0.6 cm. This probes down to bony surface. From what I understand this is a deterioration from last visit. She is using Prisma and changing this daily at home she has an offloading boot. 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 She cannot wear an offloading boot 05/24/16; using Prisma. We are making attempts to more aggressively offload the right great toe which is flexed at the interphalangeal joint by supporting this and keeping it forward in her footwear. 06/13/2016 -- the patient has had no discharge tenderness or cellulitis of the right great toe. Was seen by Dr. Mayo Ao on 06/08/2016 and an x-ray was done by him which showed  there was some cortical changes at the distal aspect of the hallux but there was no extension or worsening of osteomyelitis and there was no soft tissue emphysema. He had made arrangements for her diabetic shoes but was awaiting insurance clearance. Readmission: 09/14/2020 upon evaluation today patient appears to be doing somewhat poorly in regard to her right great toe ulcer. She tells me currently that she has been having issues with this since September 2021. She has been seen by Dr. Earleen Newport. With that being said I did review the notes. It does  appear that the patient has some issue with her great toe some contraction causes this to plantar flex which unfortunately makes the end of the toe actually rub on any shoe she wears. She has a PEG assist offloading shoe but unfortunately she is worn a hole while her big toe is which means that when she wears this her toe does fix right down until it and then continues to wrap around. Nonetheless this is not optimal and in fact is not even going to be beneficial for her. She did have an x- ray on December 23 done by Dr. Earleen Newport there was no evidence of bony destruction to indicate osteomyelitis according to his note. I agree it does not appear to be quite that significant visually. Patient's last hemoglobin A1c stated to be 8.0 in December 2021. She has been on Augmentin as prescribed by Dr. Earleen Newport. She does have a PEG assist offloading shoe but as mentioned above is really not functioning appropriately for her at this point. Again all this started in September and really she tells me has been a frustration for her she still out of work and that is given her some concern and trouble as well. There is not appear to be any evidence of local or systemic infection. No fevers, chills, nausea, vomiting, or diarrhea. The patient does have hypertension. 2/8; patient admitted to the clinic last week. She is a type II diabetic with peripheral neuropathy. She does not appear to have any vascular issues based on a normal ABI and clinical exam. She has been dealing with a wound on the tip of her right great toe for as long as 5 months. Followed with Dr. Earleen Newport at [facility]. She was using silver collagen although her supplies were delayed and arriving. She arrives today with a probing tunnel going medially subcutaneously from the original wound. She is offloading this in a surgical shoe. She works at [employer] but is off work on short-term disability. [Cannot work with an open toed  shoe]. Objective Constitutional Patient is hypertensive.. Pulse regular and within target range for patient.Marland Kitchen Respirations regular, non-labored and within target range.. Temperature is normal and within the target range for the patient.Marland Kitchen Appears in no distress. Vitals Time Taken: 7:45 AM, Height: 62 in, Weight: 169 lbs, BMI: 30.9, Temperature: 98.5 F, Pulse: 74 bpm, Respiratory Rate: 16 breaths/min, Blood Pressure: 153/67 mmHg. Cardiovascular Pedal pulses are palpable. General Notes: Wound exam; the patient had a small wound on the tip of her right great toe. The surface of this is not particularly viable probes easily down perhaps the bone. Also concerning there was a tunnel going medially with a raised area of skin medially on the tip of the toe. I used pickups and a scalpel to remove this also some subcutaneous tissue. Unfortunately there was another probing area in the middle of this second open area this does not go to bone nevertheless I cultured this area  because of the concern about the appearance of it. She has diabetic neuropathy and is insensate Integumentary (Hair, Skin) Wound #2 status is Open. Original cause of wound was Gradually Appeared. The wound is located on the Right T Great. The wound measures 0.5cm length x oe 0.5cm width x 0.3cm depth; 0.196cm^2 area and 0.059cm^3 volume. There is Fat Layer (Subcutaneous Tissue) exposed. There is no undermining noted, however, there is tunneling at 2:00 with a maximum distance of 1.1cm. There is a small amount of purulent drainage noted. The wound margin is flat and intact. There is no granulation within the wound bed. There is a large (67-100%) amount of necrotic tissue within the wound bed including Adherent Slough. 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 There was a Excisional Skin/Subcutaneous Tissue Debridement with a total area of 0.25 sq cm performed by Ricard Dillon., MD. With the following instrument(s): Blade, and Forceps to remove Viable and Non-Viable tissue/material. Material removed includes Callus, Subcutaneous Tissue, Skin: Dermis, and Skin: Epidermis after achieving pain control using Lidocaine. 1 specimen was taken by a Tissue Culture and sent to the lab per facility protocol. A time out was conducted at 08:06, prior to the start of the procedure. A Minimum amount of bleeding was controlled with Pressure. The procedure was tolerated well with a pain level of 0 throughout and a pain level of 0 following the procedure. Post Debridement Measurements: 0.5cm length x 0.5cm width x 0.3cm depth; 0.059cm^3 volume. Character of Wound/Ulcer Post Debridement is improved. Severity of Tissue Post Debridement is: Fat layer exposed. Post procedure Diagnosis Wound #2: Same as Pre-Procedure Plan Follow-up Appointments: Return Appointment in 1 week. Bathing/ Shower/ Hygiene: May shower and wash wound with soap and water. - with dressing changes Off-Loading: Open toe surgical shoe to: - right foot with felt pad to offload great toe Laboratory ordered were: Anerobic culture Radiology ordered were: X-ray, toes - right great toe WOUND #2: - T Great Wound Laterality: Right oe Cleanser: Soap and Water Every Other Day/30 Days Discharge Instructions: May shower and wash wound with dial antibacterial soap and water prior to dressing change. Cleanser: Wound Cleanser Every Other Day/30 Days Discharge Instructions: Cleanse the wound with wound cleanser prior to applying a clean dressing using gauze sponges, not tissue or cotton  balls. Prim Dressing: Promogran Prisma Matrix, 4.34 (sq in) (silver collagen) (Dispense As Written) Every Other Day/30 Days ary Discharge Instructions: Moisten collagen with saline or hydrogel Secondary Dressing: Woven Gauze Sponge, Non-Sterile 4x4 in (Generic) Every Other Day/30 Days Discharge Instructions: roll up and plce under base of great toe to lift tip of toe Secondary Dressing: Woven Gauze Sponges 2x2 in (Generic) Every Other Day/30 Days Discharge Instructions: Apply over primary dressing as directed. Secured With: Child psychotherapist, Sterile 2x75 (in/in) (Generic) Every Other Day/30 Days Discharge Instructions: Secure with stretch gauze as directed. Secured With: [name] Adherent Cohesive Bandage, Non-Sterile,1x5 (in/yd) (Generic) Every Other Day/30 Days 1. Post debridement there is another probing open area just medially to the original wound I cultured this area 2. Appearance of this is concerning for underlying osteomyelitis 3. Culture for CandS, given a  course of empiric doxycycline 4. I am going to do an x-ray of the toe. Last none in Dr. Pasty Arch office apparently in November per the patient 5. She is offloading this in a surgical shoe. She has a bit of a hammer deformity at the interphalangeal joint. I emphasized the need to keep this pointing forward in the shoe Electronic Signature(s) Signed: 09/20/2020 4:37:05 PM By: Linton Ham MD Entered By: Linton Ham on 09/20/2020 08:25:24 -------------------------------------------------------------------------------- SuperBill Details Patient Name: Date of Service: [name] [name], [name] [name] 09/20/2020 Medical Record Number: [id number] Patient Account Number: [id number] Date of Birth/Sex: Treating RN: Jan 15, 1956 (65 y.o. [name] [name], [name] [name] Primary Care Provider: Elvia Collum Other Clinician: Referring Provider: Treating Provider/Extender: Melody Haver, Malena Weeks in Treatment:  0 Diagnosis Coding ICD-10 Codes Code Description E11.621 Type 2 diabetes mellitus with foot ulcer L97.512 Non-pressure chronic ulcer of other part of right foot with fat layer exposed E11.42 Type 2 diabetes mellitus with diabetic polyneuropathy I10 Essential (primary) hypertension Facility Procedures CPT4 Code: [id number] Description: 61915 - DEB SUBQ TISSUE 20 SQ CM/< ICD-10 Diagnosis Description L97.512 Non-pressure chronic ulcer of other part of right foot with fat layer exposed Modifier: Quantity: 1 Physician Procedures : CPT4 Code Description Modifier [id number] 32009 - WC PHYS SUBQ TISS 20 SQ CM ICD-10 Diagnosis Description L97.512 Non-pressure chronic ulcer of other part of right foot with fat layer exposed Quantity: 1 Electronic Signature(s) Signed: 09/20/2020 4:37:05 PM By: Linton Ham MD Signed: 09/20/2020 5:05:22 PM By: Rhae Hammock RN Entered By: Rhae Hammock on 09/20/2020 08:15:48

## 2020-09-21 ENCOUNTER — Other Ambulatory Visit (HOSPITAL_COMMUNITY)
Admission: RE | Admit: 2020-09-21 | Discharge: 2020-09-21 | Disposition: A | Discharge status: Home / Self Care | Payer: BC Managed Care – PPO | Source: Other Acute Inpatient Hospital | Attending: Internal Medicine | Admitting: Internal Medicine

## 2020-09-21 DIAGNOSIS — E11621 Type 2 diabetes mellitus with foot ulcer: Secondary | ICD-10-CM | POA: Insufficient documentation

## 2020-09-23 LAB — AEROBIC CULTURE W GRAM STAIN (SUPERFICIAL SPECIMEN): Gram Stain: NONE SEEN

## 2020-09-29 ENCOUNTER — Other Ambulatory Visit: Payer: Self-pay

## 2020-09-29 ENCOUNTER — Encounter (HOSPITAL_BASED_OUTPATIENT_CLINIC_OR_DEPARTMENT_OTHER): Payer: BC Managed Care – PPO | Admitting: Internal Medicine

## 2020-09-29 DIAGNOSIS — E1142 Type 2 diabetes mellitus with diabetic polyneuropathy: Secondary | ICD-10-CM | POA: Diagnosis not present

## 2020-09-29 DIAGNOSIS — E11621 Type 2 diabetes mellitus with foot ulcer: Secondary | ICD-10-CM | POA: Diagnosis not present

## 2020-09-29 DIAGNOSIS — I10 Essential (primary) hypertension: Secondary | ICD-10-CM | POA: Diagnosis not present

## 2020-09-29 DIAGNOSIS — E11319 Type 2 diabetes mellitus with unspecified diabetic retinopathy without macular edema: Secondary | ICD-10-CM | POA: Diagnosis not present

## 2020-09-29 DIAGNOSIS — L97511 Non-pressure chronic ulcer of other part of right foot limited to breakdown of skin: Secondary | ICD-10-CM | POA: Diagnosis not present

## 2020-09-29 DIAGNOSIS — L97512 Non-pressure chronic ulcer of other part of right foot with fat layer exposed: Secondary | ICD-10-CM | POA: Diagnosis not present

## 2020-09-29 NOTE — Progress Notes (Signed)
[name] [name], DEA ([id number]) Visit Report for 09/29/2020 Debridement Details Patient Name: Date of Service: [place], [name] 09/29/2020 8:00 A M Medical Record Number: [id number] Patient Account Number: [id number] Date of Birth/Sex: Treating RN: [birth year] (65 y.o. Helene Shoe, Tammi Klippel Primary Care Provider: Elvia Collum Other Clinician: Referring Provider: Treating Provider/Extender: Melody Haver, Malena Weeks in Treatment: 2 Debridement Performed for Assessment: Wound #2 Right T Great oe Performed By: Physician Ricard Dillon., MD Debridement Type: Debridement Severity of Tissue Pre Debridement: Limited to breakdown of skin Level of Consciousness (Pre-procedure): Awake and Alert Pre-procedure Verification/Time Out Yes - 08:35 Taken: Start Time: 08:36 Pain Control: Lidocaine 4% T opical Solution T Area Debrided (L x W): otal 1 (cm) x 1 (cm) = 1 (cm) Tissue and other material debrided: Viable, Non-Viable, Callus, Skin: Dermis , Skin: Epidermis Level: Skin/Epidermis Debridement Description: Selective/Open Wound Instrument: Curette Bleeding: Minimum Hemostasis Achieved: Pressure End Time: 08:40 Procedural Pain: 0 Post Procedural Pain: 0 Response to Treatment: Procedure was tolerated well Level of Consciousness (Post- Awake and Alert procedure): Post Debridement Measurements of Total Wound Length: (cm) 0.3 Width: (cm) 0.3 Depth: (cm) 0.2 Volume: (cm) 0.014 Character of Wound/Ulcer Post Debridement: Improved Severity of Tissue Post Debridement: Limited to breakdown of skin Post Procedure Diagnosis Same as Pre-procedure Electronic Signature(s) Signed: 09/29/2020 5:29:55 PM By: Linton Ham MD Signed: 09/29/2020 5:51:10 PM By: Deon Pilling Entered By: Linton Ham on 09/29/2020 08:46:40 -------------------------------------------------------------------------------- HPI Details Patient Name: Date of Service: [name], [name] 09/29/2020 8:00 A M Medical  Record Number: [id number] Patient Account Number: [id number] Date of Birth/Sex: Treating RN: 1955-11-09 (65 y.o. [name] [name] Primary Care Provider: Elvia Collum Other Clinician: Referring Provider: Treating Provider/Extender: Melody Haver, Malena Weeks in Treatment: 2 History of Present Illness HPI Description: 65 year old female referred to [place] by her PCP Dr. Delrae Rend, with a past medical history of type 2 diabetes mellitus with diabetic polyneuropathy, diabetic retinopathy, hyperglycemia, fracture of right great toe, diabetic foot ulcer. she has never been a smoker. The patient has had a lower extremity arterial study done in April of this year and the right ABI was 1.27 and the left ABI was 1.14. The right toe brachial index was 0.60 and the left was 0.59. The impression was no evidence of hemodynamically significant lower extremity arterial occlusive disease through the ankles bilaterally. Decreased pulse volume recordings of the left great toe suggests distal small vessel disease. note is made of the fact that the patient was seen in early April of this year in the ED with what was thought to be a cellulitis and was put on doxycycline and was asked to follow-up with her PCP. x-ray done at that stage revealed -- IMPRESSION:There is a corner fracture at medial base of distal phalanx great toe. Clinical correlation is necessary to exclude recurrent fracture or nonunion of previous fracture. There is diffuse soft tissue swelling great toe. The patient said she had some back surgery in 2015 which led to what sounds like a foot drop and she has been having problems raising her foot and walking well and the right big toe was injured and fractured a while ago. She also mentioned she does not take very good care of her diabetes diet. 01/24/2016 -- right foot x-ray -- IMPRESSION:There is healing corner fracture at the medial base of distal phalanx great toe with partial union. There  is soft tissue swelling right great toe. On oblique view there is subtle cortical irregularity distal medial  aspect of proximal phalanx great toe. Osteomyelitis cannot be excluded. Clinical correlation is necessary.Further correlation with MRI is recommended as clinically warranted. In view of the above findings we will order a MRI, which has not been done yet and see her back next week with this result. 01/31/2016 - MRI of the right foot -- IMPRESSION:1. Diffuse edema and enhancement of the entire distal phalanx of the great toe within the adjacent soft tissue ulceration and soft tissue devitalization. I suspect this represents early diffuse osteomyelitis. 2. No evidence of osteomyelitis of the proximal phalanx of the great toe. 3. Probable cellulitis along the lateral aspect of the first metatarsal. 02/07/2016 -- she is still awaiting insurance clearance to start her hyperbaric oxygen therapy and the appointment to see Dr. Mayo Ao. 03/24/2016 -- the patient has been continuing with hyperbaric oxygen therapy and has finished 17 treatments. She has a infectious disease consult with Dr. Linus Salmons tomorrow She was seen by Dr. Mayo Ao on 03/09/2016 and his plan was to start the patient on clindamycin and treat her for osteomyelitis. So far the patient has been on oral antibiotics The pathology was negative for osteomyelitis however the bone culture was positive. We will get this report from Dr. Pasty Arch office. Addendum : we have received the reports from Dr. Pasty Arch office and the pathology showed benign bone with new bone formation and no evidence of significant inflammation. Culture report was a Beta Hemolytic Streptococcus which was sensitive penicillins and other beta lactams and rare Staphylococcus aureus, MSSA, which was sensitive to tetracycline and clindamycin and Bactrim ciprofloxacin and oxacillin. 03/28/2016 -- was seen by Dr. Scharlene Gloss who reviewed her history and physical  and x-rays and for the culture positive bone biopsy with negative pathology for osteomyelitis he did not recommend long-term IV antibiotics. T treat her orally for 21 days and because it was pansensitive recommended o Keflex 500 mg 4 times a day for 21 days. He would see her back in 4 weeks to recheck an ESR and CRP and if worse would consider IV antibiotics at that time. 04/04/2016 -- she dropped a sharp object on her left dorsum of the foot a couple of days ago but there was a superficial abrasion and no deep open wound. 04/18/2016 -- she was seen by Dr. Berton Bon of infectious disease who plan to stop antibiotics and observe and check her CRP and ESR and she would follow when necessary for concerns. the sedimentation rate was 4 mm per hour, and the CRP was less than 0.5 mg per DL she was also seen by Dr. Mayo Ao who did an x-ray of the right foot but his notes and report is not available yet. 04/25/2016 -- I have reviewed the notes from Dr. Mayo Ao who saw her on 04/13/2016. no x-rays were taken in follow-up. The patient was asked to continue with care at the wound center. 05/09/2016 -- she was reviewed by Dr. Mayo Ao on Monday and he has discussed getting her diabetic shoes made. Other than that no changes were recommended. 05/15/16 patient arrives today with the wound on the tip of her great toe measuring at 0.6 cm. This probes down to bony surface. From what I understand this is a deterioration from last visit. She is using Prisma and changing this daily at home she has an offloading boot. 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 She cannot wear an offloading boot 05/24/16; using Prisma. We are making attempts to more aggressively offload the right great toe which is flexed  at the interphalangeal joint by supporting this and keeping it forward in her footwear. 06/13/2016 -- the patient has had no discharge tenderness or cellulitis of the right great toe. Was seen by Dr. Mayo Ao on 06/08/2016 and an x-ray was done by him which showed there was some cortical changes at the distal aspect of the hallux but there was no extension or worsening of osteomyelitis and there was no soft tissue emphysema. He had made arrangements for her diabetic shoes but was awaiting insurance clearance. Readmission: 09/14/2020 upon evaluation today patient appears to be doing somewhat poorly in regard to her right great toe ulcer. She tells me currently that she has been having issues with this since September 2021. She has been seen by Dr. Earleen Newport. With that being said I did review the notes. It does appear that the patient has some issue with her great toe some contraction causes this to plantar flex which unfortunately makes the end of the toe actually rub on any shoe she wears. She has a PEG assist offloading shoe but unfortunately she is worn a hole while her big toe is which means that when she wears this her toe does fix right down until it and then continues to wrap around. Nonetheless this is not optimal and in fact is not even going to be beneficial for her. She did have an x- ray on December 23 done by Dr. Earleen Newport there was no evidence of bony destruction to indicate osteomyelitis according to his note. I agree it does not appear to be quite that significant visually. Patient's last hemoglobin A1c stated to be 8.0 in December 2021. She has been on Augmentin as prescribed by Dr. Earleen Newport. She does have a PEG assist offloading shoe but as mentioned above is really not functioning appropriately for her at this point. Again all this started in September and really she tells me has been a frustration for her she still out of work and that is given her some concern and trouble as well.  There is not appear to be any evidence of local or systemic infection. No fevers, chills, nausea, vomiting, or diarrhea. The patient does have hypertension. 2/8; patient admitted to the clinic last week. She is a type II diabetic with peripheral neuropathy. She does not appear to have any vascular issues based on a normal ABI and clinical exam. She has been dealing with a wound on the tip of her right great toe for as long as 5 months. Followed with Dr. Earleen Newport at [facility]. She was using silver collagen although her supplies were delayed and arriving. She arrives today with a probing tunnel going medially subcutaneously from the original wound. She is offloading this in a surgical shoe. She works at [employer] but is off work on short-term disability. [Cannot work with an open toed shoe]. 2/17 in general things look better. She is using silver collagen gauze and a light Coban over the toe. Her x-ray from last week was negative for osteomyelitis culture only grew Corynebacterium likely a skin contaminant Electronic Signature(s) Signed: 09/29/2020 5:29:55 PM By: Linton Ham MD Entered By: Linton Ham on 09/29/2020 08:47:17 -------------------------------------------------------------------------------- Physical Exam Details Patient Name: Date of Service: Marlinda Mike  [name] 09/29/2020 8:00 A M Medical Record Number: [id number] Patient Account Number: [id number] Date of Birth/Sex: Treating RN: 09-Sep-1955 (65 y.o. [name] [name] Primary Care Provider: Elvia Collum Other Clinician: Referring Provider: Treating Provider/Extender: Melody Haver, Malena Weeks in Treatment: 2 Constitutional Sitting or standing Blood Pressure is within target range for patient.. Pulse regular and within target range for patient.Marland Kitchen Respirations regular, non-labored and within target range.. Temperature is normal and within the target range for the patient.Marland Kitchen Appears in no distress. Notes Wound  exam; the patient has had small wound at the tip of her right great toe. The area that I unroofed last week is largely filled and although there is flaking skin and debris on the surface which I removed with a #3 curette. The original wound looks healthy there is no probing bone generally the area looks a lot better than when she came in last week. Electronic Signature(s) Signed: 09/29/2020 5:29:55 PM By: Linton Ham MD Entered By: Linton Ham on 09/29/2020 08:50:23 -------------------------------------------------------------------------------- Physician Orders Details Patient Name: Date of Service: Candida Peeling, [name] 09/29/2020 8:00 A M Medical Record Number: [id number] Patient Account Number: [id number] Date of Birth/Sex: Treating RN: July 17, 1956 (65 y.o. Helene Shoe, Tammi Klippel Primary Care Provider: Elvia Collum Other Clinician: Referring Provider: Treating Provider/Extender: Melody Haver, Malena Weeks in Treatment: 2 Verbal / Phone Orders: No Diagnosis Coding ICD-10 Coding Code Description E11.621 Type 2 diabetes mellitus with foot ulcer L97.512 Non-pressure chronic ulcer of other part of right foot with fat layer exposed E11.42 Type 2 diabetes mellitus with diabetic polyneuropathy I10 Essential (primary) hypertension Follow-up Appointments ppointment in 1 week. - Tuesday Return A Bathing/ Shower/ Hygiene May shower and wash wound with soap and water. - with dressing changes Off-Loading Open toe surgical shoe to: - right foot with felt pad to offload great toe Wound Treatment Wound #2 - T Great oe Wound Laterality: Right Cleanser: Soap and Water Every Other Day/30 Days Discharge Instructions: May shower and wash wound with dial antibacterial soap and water prior to dressing change. Cleanser: Wound Cleanser Every Other Day/30 Days Discharge Instructions: Cleanse the wound with wound cleanser prior to applying a clean dressing using gauze sponges, not tissue or  cotton balls. Prim Dressing: Promogran Prisma Matrix, 4.34 (sq in) (silver collagen) (Dispense As Written) Every Other Day/30 Days ary Discharge Instructions: Moisten collagen with saline or hydrogel Secondary Dressing: Woven Gauze Sponges 2x2 in (Generic) Every Other Day/30 Days Discharge Instructions: Apply over primary dressing as directed.also apply roll gauze under toe to straighten out toe. Secured With: Child psychotherapist, Sterile 2x75 (in/in) (Generic) Every Other Day/30 Days Discharge Instructions: Secure with stretch gauze as directed. Secured With: [place] Adherent Cohesive Bandage, Non-Sterile,1x5 (in/yd) (Generic) Every Other Day/30 Days Electronic Signature(s) Signed: 09/29/2020 5:29:55 PM By: Linton Ham MD Signed: 09/29/2020 5:51:10 PM By: Deon Pilling Entered By: Deon Pilling on 09/29/2020 08:41:33 -------------------------------------------------------------------------------- Problem List Details Patient Name: Date of Service: [name], [name] 09/29/2020 8:00 A M Medical Record Number: [id number] Patient Account Number: [id number] Date of Birth/Sex: Treating RN: 12-25-1955 (65 y.o. [name] [name] Primary Care Provider: Elvia Collum Other Clinician: Referring Provider: Treating Provider/Extender: Melody Haver, Malena Weeks in Treatment: 2 Active Problems ICD-10 Encounter Code Description Active Date MDM Diagnosis E11.621 Type 2 diabetes mellitus with foot ulcer 09/14/2020 No Yes L97.512 Non-pressure chronic ulcer of other part of right foot with fat layer exposed 09/14/2020 No Yes E11.42 Type 2 diabetes mellitus with diabetic polyneuropathy  09/14/2020 No Yes I10 Essential (primary) hypertension 09/14/2020 No Yes Inactive Problems Resolved Problems Electronic Signature(s) Signed: 09/29/2020 5:29:55 PM By: Linton Ham MD Entered By: Linton Ham on 09/29/2020  08:46:18 -------------------------------------------------------------------------------- Progress Note Details Patient Name: Date of Service: Candida Peeling, [name] 09/29/2020 8:00 A M Medical Record Number: [id number] Patient Account Number: [id number] Date of Birth/Sex: Treating RN: [birth year] (65 y.o. [name] [name] Primary Care Provider: Elvia Collum Other Clinician: Referring Provider: Treating Provider/Extender: Melody Haver, Malena Weeks in Treatment: 2 Subjective History of Present Illness (HPI) 65 year old female referred to [place] by her PCP Dr. Delrae Rend, with a past medical history of type 2 diabetes mellitus with diabetic polyneuropathy, diabetic retinopathy, hyperglycemia, fracture of right great toe, diabetic foot ulcer. she has never been a smoker. The patient has had a lower extremity arterial study done in April of this year and the right ABI was 1.27 and the left ABI was 1.14. The right toe brachial index was 0.60 and the left was 0.59. The impression was no evidence of hemodynamically significant lower extremity arterial occlusive disease through the ankles bilaterally. Decreased pulse volume recordings of the left great toe suggests distal small vessel disease. note is made of the fact that the patient was seen in early April of this year in the ED with what was thought to be a cellulitis and was put on doxycycline and was asked to follow-up with her PCP. x-ray done at that stage revealed -- IMPRESSION:There is a corner fracture at medial base of distal phalanx great toe. Clinical correlation is necessary to exclude recurrent fracture or nonunion of previous fracture. There is diffuse soft tissue swelling great toe. The patient said she had some back surgery in 2015 which led to what sounds like a foot drop and she has been having problems raising her foot and walking well and the right big toe was injured and fractured a while ago. She also mentioned she does not  take very good care of her diabetes diet. 01/24/2016 -- right foot x-ray -- IMPRESSION:There is healing corner fracture at the medial base of distal phalanx great toe with partial union. There is soft tissue swelling right great toe. On oblique view there is subtle cortical irregularity distal medial aspect of proximal phalanx great toe. Osteomyelitis cannot be excluded. Clinical correlation is necessary.Further correlation with MRI is recommended as clinically warranted. In view of the above findings we will order a MRI, which has not been done yet and see her back next week with this result. 01/31/2016 - MRI of the right foot -- IMPRESSION:1. Diffuse edema and enhancement of the entire distal phalanx of the great toe within the adjacent soft tissue ulceration and soft tissue devitalization. I suspect this represents early diffuse osteomyelitis. 2. No evidence of osteomyelitis of the proximal phalanx of the great toe. 3. Probable cellulitis along the lateral aspect of the first metatarsal. 02/07/2016 -- she is still awaiting insurance clearance to start her hyperbaric oxygen therapy and the appointment to see Dr. Mayo Ao. 03/24/2016 -- the patient has been continuing with hyperbaric oxygen therapy and has finished 17 treatments. She has a infectious disease consult with Dr. Linus Salmons tomorrow She was seen by Dr. Mayo Ao on 03/09/2016 and his plan was to start the patient on clindamycin and treat her for osteomyelitis. So far the patient has been on oral antibiotics The pathology was negative for osteomyelitis however the bone culture was positive. We will get this report from Dr. Pasty Arch office. Addendum :  we have received the reports from Dr. Pasty Arch office and the pathology showed benign bone with new bone formation and no evidence of significant inflammation. Culture report was a Beta Hemolytic Streptococcus which was sensitive penicillins and other beta lactams and rare  Staphylococcus aureus, MSSA, which was sensitive to tetracycline and clindamycin and Bactrim ciprofloxacin and oxacillin. 03/28/2016 -- was seen by Dr. Scharlene Gloss who reviewed her history and physical and x-rays and for the culture positive bone biopsy with negative pathology for osteomyelitis he did not recommend long-term IV antibiotics. T treat her orally for 21 days and because it was pansensitive recommended o Keflex 500 mg 4 times a day for 21 days. He would see her back in 4 weeks to recheck an ESR and CRP and if worse would consider IV antibiotics at that time. 04/04/2016 -- she dropped a sharp object on her left dorsum of the foot a couple of days ago but there was a superficial abrasion and no deep open wound. 04/18/2016 -- she was seen by Dr. Berton Bon of infectious disease who plan to stop antibiotics and observe and check her CRP and ESR and she would follow when necessary for concerns. the sedimentation rate was 4 mm per hour, and the CRP was less than 0.5 mg per DL she was also seen by Dr. Mayo Ao who did an x-ray of the right foot but his notes and report is not available yet. 04/25/2016 -- I have reviewed the notes from Dr. Mayo Ao who saw her on 04/13/2016. no x-rays were taken in follow-up. The patient was asked to continue with care at the wound center. 05/09/2016 -- she was reviewed by Dr. Mayo Ao on Monday and he has discussed getting her diabetic shoes made. Other than that no changes were recommended. 05/15/16 patient arrives today with the wound on the tip of her great toe measuring at 0.6 cm. This probes down to bony surface. From what I understand this is a deterioration from last visit. She is using Prisma and changing this daily at home she has an offloading boot. 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 She cannot wear an offloading boot 05/24/16; using Prisma. We are making attempts to more aggressively offload the right great toe which is flexed at the interphalangeal joint by supporting this and keeping it forward in her footwear. 06/13/2016 -- the patient has had no discharge tenderness or cellulitis of the right great toe. Was seen by Dr. Mayo Ao on 06/08/2016 and an x-ray was done by him which showed there was some cortical changes at the distal aspect of the hallux but there was no extension or worsening of osteomyelitis and there was no soft tissue emphysema. He had made arrangements for her diabetic shoes but was awaiting insurance clearance. Readmission: 09/14/2020 upon evaluation today patient appears to be doing somewhat poorly in regard to her right great toe ulcer. She tells me currently that she has been having issues with this since September 2021. She has been seen by Dr. Earleen Newport. With that being said I did review the notes. It does appear that the patient has some issue with her great toe some contraction causes this to plantar flex which unfortunately makes the end of the toe actually  rub on any shoe she wears. She has a PEG assist offloading shoe but unfortunately she is worn a hole while her big toe is which means that when she wears this her toe does fix right down until it and then continues to wrap around. Nonetheless this is not optimal and in fact is not even going to be beneficial for her. She did have an x- ray on December 23 done by Dr. Earleen Newport there was no evidence of bony destruction to indicate osteomyelitis according to his note. I agree it does not appear to be quite that significant visually. Patient's last hemoglobin A1c stated to be 8.0 in December 2021. She has been on Augmentin as prescribed by Dr. Earleen Newport. She does have a PEG assist offloading shoe but as mentioned above is really not functioning  appropriately for her at this point. Again all this started in September and really she tells me has been a frustration for her she still out of work and that is given her some concern and trouble as well. There is not appear to be any evidence of local or systemic infection. No fevers, chills, nausea, vomiting, or diarrhea. The patient does have hypertension. 2/8; patient admitted to the clinic last week. She is a type II diabetic with peripheral neuropathy. She does not appear to have any vascular issues based on a normal ABI and clinical exam. She has been dealing with a wound on the tip of her right great toe for as long as 5 months. Followed with Dr. Earleen Newport at [facility]. She was using silver collagen although her supplies were delayed and arriving. She arrives today with a probing tunnel going medially subcutaneously from the original wound. She is offloading this in a surgical shoe. She works at [employer] but is off work on short-term disability. [Cannot work with an open toed shoe]. 2/17 in general things look better. She is using silver collagen gauze and a light Coban over the toe. Her x-ray from last week was negative for osteomyelitis culture only grew Corynebacterium likely a skin contaminant Objective Constitutional Sitting or standing Blood Pressure is within target range for patient.. Pulse regular and within target range for patient.Marland Kitchen Respirations regular, non-labored and within target range.. Temperature is normal and within the target range for the patient.Marland Kitchen Appears in no distress. Vitals Time Taken: 8:18 AM, Height: 62 in, Weight: 169 lbs, BMI: 30.9, Temperature: 97.9 F, Pulse: 66 bpm, Respiratory Rate: 20 breaths/min, Blood Pressure: 122/59 mmHg, Capillary Blood Glucose: 109 mg/dl. General Notes: glucose per pt report General Notes: Wound exam; the patient has had small wound at the tip of her right great toe. The area that I unroofed last week is largely filled and  although there is flaking skin and debris on the surface which I removed with a #3 curette. The original wound looks healthy there is no probing bone generally the area looks a lot better than when she came in last week. Integumentary (Hair, Skin) Wound #2 status is Open. Original cause of wound was Gradually Appeared. The wound is located on the Right T Great. The wound measures 0.3cm length x oe 0.3cm width x 0.2cm depth; 0.071cm^2 area and 0.014cm^3 volume. There is Fat Layer (Subcutaneous Tissue) exposed. There is no tunneling noted, however, there is undermining starting at 12:00 and ending at 12:00 with a maximum distance of 0.3cm. There is a small amount of serosanguineous drainage noted. The wound margin is well defined and not attached to the wound base.  There is large (67-100%) red granulation within the wound bed. There is no necrotic tissue within the wound bed. 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 There was a Selective/Open Wound Skin/Epidermis Debridement with a total area of 1 sq cm performed by Ricard Dillon., MD. With the following instrument(s): Curette to remove Viable and Non-Viable tissue/material. Material removed includes Callus, Skin: Dermis, and Skin: Epidermis after achieving pain control using Lidocaine 4% Topical Solution. A time out was conducted at 08:35, prior to the start of the procedure. A Minimum amount of bleeding was controlled with Pressure. The procedure was tolerated well with a pain level of 0 throughout and a pain level of 0 following  the procedure. Post Debridement Measurements: 0.3cm length x 0.3cm width x 0.2cm depth; 0.014cm^3 volume. Character of Wound/Ulcer Post Debridement is improved. Severity of Tissue Post Debridement is: Limited to breakdown of skin. Post procedure Diagnosis Wound #2: Same as Pre-Procedure Plan Follow-up Appointments: Return Appointment in 1 week. - Tuesday Bathing/ Shower/ Hygiene: May shower and wash wound with soap and water. - with dressing changes Off-Loading: Open toe surgical shoe to: - right foot with felt pad to offload great toe WOUND #2: - T Great Wound Laterality: Right oe Cleanser: Soap and Water Every Other Day/30 Days Discharge Instructions: May shower and wash wound with dial antibacterial soap and water prior to dressing change. Cleanser: Wound Cleanser Every Other Day/30 Days Discharge Instructions: Cleanse the wound with wound cleanser prior to applying a clean dressing using gauze sponges, not tissue or cotton balls. Prim Dressing: Promogran Prisma Matrix, 4.34 (sq in) (silver collagen) (Dispense As Written) Every Other Day/30 Days ary Discharge Instructions: Moisten collagen with saline or hydrogel Secondary Dressing: Woven Gauze Sponges 2x2 in (Generic) Every Other Day/30 Days Discharge Instructions: Apply over primary dressing as directed.also apply roll gauze under toe to straighten out toe. Secured With: Child psychotherapist, Sterile 2x75 (in/in) (Generic) Every Other Day/30 Days Discharge Instructions: Secure with stretch gauze as directed. Secured With: [employer] Adherent Cohesive Bandage, Non-Sterile,1x5 (in/yd) (Generic) Every Other Day/30 Days 1. I am continuing with silver collagen gauze and a Coban as long as it is not too tight to 2 patient is on short-term disability from her job she has to wear closed toed shoes 3. She is completing her antibiotic even though the culture was negative. No additional antibiotics Electronic  Signature(s) Signed: 09/29/2020 5:29:55 PM By: Linton Ham MD Entered By: Linton Ham on 09/29/2020 08:51:17 -------------------------------------------------------------------------------- SuperBill Details Patient Name: Date of Service: Candida Peeling, [name] 09/29/2020 Medical Record Number: [id number] Patient Account Number: [id number] Date of Birth/Sex: Treating RN: [birth year] (65 y.o. [name] [name] Primary Care Provider: Elvia Collum Other Clinician: Referring Provider: Treating Provider/Extender: Melody Haver, Malena Weeks in Treatment: 2 Diagnosis Coding ICD-10 Codes Code Description E11.621 Type 2 diabetes mellitus with foot ulcer L97.512 Non-pressure chronic ulcer of other part of right foot with fat layer exposed E11.42 Type 2 diabetes mellitus with diabetic polyneuropathy I10 Essential (primary) hypertension Facility Procedures CPT4 Code: [id number] Description: 56389 - DEBRIDE WOUND 1ST 20 SQ CM OR < ICD-10 Diagnosis Description L97.512 Non-pressure  chronic ulcer of other part of right foot with fat layer exposed Modifier: Quantity: 1 Physician Procedures : CPT4 Code Description Modifier [id number] - WC PHYS DEBR WO ANESTH 20 SQ CM ICD-10 Diagnosis Description L97.512 Non-pressure chronic ulcer of other part of right foot with fat layer exposed Quantity: 1 Electronic Signature(s) Signed: 09/29/2020 5:29:55 PM By: Linton Ham MD Entered By: Linton Ham on 09/29/2020 08:51:28

## 2020-09-30 NOTE — Progress Notes (Signed)
[name] [name], [name] [name] ([id number]) Visit Report for 09/29/2020 Arrival Information Details Patient Name: Date of Service: [name] [name], [place] [name] P. 09/29/2020 8:00 A M Medical Record Number: [id number] Patient Account Number: [id number] Date of Birth/Sex: Treating RN: July 06, 1956 (65 y.o. [name] [name] Primary Care [name] [name]: [name] [name] Other Clinician: Referring Nataliyah [name]: Treating [name] [name]/Extender: [name] [name], [name] [name] in Treatment: 2 Visit Information History Since Last Visit Added or deleted any medications: No Patient Arrived: Ambulatory Any new allergies or adverse reactions: No Arrival Time: 08:18 Had a fall or experienced change in No Accompanied By: husband activities of daily living that may affect Transfer Assistance: None risk of falls: Patient Identification Verified: Yes Signs or symptoms of abuse/neglect since last visito No Secondary Verification Process Completed: Yes Hospitalized since last visit: No Patient Requires Transmission-Based Precautions: No Implantable device outside of the clinic excluding No Patient Has Alerts: No cellular tissue based products placed in the center since last visit: Has Dressing in Place as Prescribed: Yes Pain Present Now: No Electronic Signature(s) Signed: 09/30/2020 4:25:56 PM By: [name] Abts RN, BSN Entered By: [name] [name] on 09/29/2020 08:19:17 -------------------------------------------------------------------------------- Encounter Discharge Information Details Patient Name: Date of Service: [name] [name] [name] P. 09/29/2020 8:00 A M Medical Record Number: [id number] Patient Account Number: [id number] Date of Birth/Sex: Treating RN: 15-Dec-1955 (65 y.o. [name] [name] Primary Care Beya Tipps: [name] [name] Other Clinician: Referring Tonnya [name]: Treating Lynsay Fesperman/Extender: [name] [name], [name] [name] in Treatment: 2 Encounter Discharge Information Items Post Procedure Vitals Discharge Condition:  Stable Temperature (F): 97.9 Ambulatory Status: Ambulatory Pulse (bpm): 66 Discharge Destination: Home Respiratory Rate (breaths/min): 18 Transportation: Private Auto Blood Pressure (mmHg): 122/59 Accompanied By: spouse Schedule Follow-up Appointment: Yes Clinical Summary of Care: Patient Declined Electronic Signature(s) Signed: 09/29/2020 6:31:13 PM By: [name] Deed RN, BSN Entered By: [name] [name] on 09/29/2020 09:02:44 -------------------------------------------------------------------------------- Lower Extremity Assessment Details Patient Name: Date of Service: [name] [name] [name] P. 09/29/2020 8:00 A M Medical Record Number: [id number] Patient Account Number: [id number] Date of Birth/Sex: Treating RN: 1956-07-11 (65 y.o. [name] [name] Primary Care [name] [name]: [name] [name] Other Clinician: Referring Murice Barbar: Treating Requan Hardge/Extender: [name] [name], [name] [name] in Treatment: 2 Edema Assessment Assessed: [Left: No] [Right: No] Edema: [Left: N] [Right: o] Calf Left: Right: Point of Measurement: 28 cm From Medial Instep 38 cm Ankle Left: Right: Point of Measurement: 10 cm From Medial Instep 21 cm Vascular Assessment Pulses: Dorsalis Pedis Palpable: [Right:Yes] Electronic Signature(s) Signed: 09/30/2020 4:25:56 PM By: [name] Abts RN, BSN Entered By: [name] [name] on 09/29/2020 08:20:05 -------------------------------------------------------------------------------- Multi Wound Chart Details Patient Name: Date of Service: [name] [name] [name] P. 09/29/2020 8:00 A M Medical Record Number: [id number] Patient Account Number: [id number] Date of Birth/Sex: Treating RN: 23-Dec-1955 (65 y.o. Debara [name], [name] [name] Primary Care [name] [name]: [name] [name] Other Clinician: Referring Shellyann Wandrey: Treating Sedona Wenk/Extender: [name] [name], [name] [name] in Treatment: 2 Vital Signs Height(in): 62 Capillary Blood Glucose(mg/dl): 220 Weight(lbs): 254 Pulse(bpm):  66 Body Mass Index(BMI): 31 Blood Pressure(mmHg): 122/59 Temperature(F): 97.9 Respiratory Rate(breaths/min): 20 Photos: [2:No Photos Right T Great oe] [N/A:N/A N/A] Wound Location: [2:Gradually Appeared] [N/A:N/A] Wounding Event: [2:Diabetic Wound/Ulcer of the Lower] [N/A:N/A] Primary Etiology: [2:Extremity Anemia, Hypertension, Type II] [N/A:N/A] Comorbid History: [2:Diabetes, Neuropathy 05/08/2020] [N/A:N/A] Date Acquired: [2:2] [N/A:N/A] [name] of Treatment: [2:Open] [N/A:N/A] Wound Status: [2:0.3x0.3x0.2] [N/A:N/A] Measurements L x W x D (cm) [2:0.071] [N/A:N/A] A (cm) : rea [2:0.014] [N/A:N/A] Volume (cm) : [2:54.80%] [N/A:N/A] % Reduction in A rea: [2:54.80%] [N/A:N/A] % Reduction in Volume: [2:12] Starting Position 1 (  o'clock): [2:12] Ending Position 1 (o'clock): [2:0.3] Maximum Distance 1 (cm): [2:Yes] [N/A:N/A] Undermining: [2:Grade 2] [N/A:N/A] Classification: [2:Small] [N/A:N/A] Exudate A mount: [2:Serosanguineous] [N/A:N/A] Exudate Type: [2:red, brown] [N/A:N/A] Exudate Color: [2:Well defined, not attached] [N/A:N/A] Wound Margin: [2:Large (67-100%)] [N/A:N/A] Granulation A mount: [2:Red] [N/A:N/A] Granulation Quality: [2:None Present (0%)] [N/A:N/A] Necrotic A mount: [2:Fat Layer (Subcutaneous Tissue): Yes N/A] Exposed Structures: [2:Fascia: No Tendon: No Muscle: No Joint: No Bone: No None] [N/A:N/A] Epithelialization: [2:Debridement - Selective/Open Wound N/A] Debridement: Pre-procedure Verification/Time Out 08:35 [N/A:N/A] Taken: [2:Lidocaine 4% Topical Solution] [N/A:N/A] Pain Control: [2:Callus] [N/A:N/A] Tissue Debrided: [2:Skin/Epidermis] [N/A:N/A] Level: [2:1] [N/A:N/A] Debridement A (sq cm): [2:rea Curette] [N/A:N/A] Instrument: [2:Minimum] [N/A:N/A] Bleeding: [2:Pressure] [N/A:N/A] Hemostasis A chieved: [2:0] [N/A:N/A] Procedural Pain: [2:0] [N/A:N/A] Post Procedural Pain: [2:Procedure was tolerated well] [N/A:N/A] Debridement Treatment Response:  [2:0.3x0.3x0.2] [N/A:N/A] Post Debridement Measurements L x W x D (cm) [2:0.014] [N/A:N/A] Post Debridement Volume: (cm) [2:Debridement] [N/A:N/A] Treatment Notes Electronic Signature(s) Signed: 09/29/2020 5:29:55 PM By: Baltazar Najjar MD Signed: 09/29/2020 5:51:10 PM By: Shawn Stall Entered By: Baltazar Najjar on 09/29/2020 08:46:27 -------------------------------------------------------------------------------- Multi-Disciplinary Care Plan Details Patient Name: Date of Service: [facility], [name] [name] P. 09/29/2020 8:00 A M Medical Record Number: [id number] Patient Account Number: [id number] Date of Birth/Sex: Treating RN: Sep 27, 1955 (65 y.o. Debara [name], [name] [name] Primary Care Akram Kissick: [name] [name] Other Clinician: Referring Jden Want: Treating Shemeca Lukasik/Extender: [name] [name], [name] [name] in Treatment: 2 Active Inactive Nutrition Nursing Diagnoses: Impaired glucose control: actual or potential Potential for alteratiion in Nutrition/Potential for imbalanced nutrition Goals: Patient/caregiver will maintain therapeutic glucose control Date Initiated: 09/14/2020 Target Resolution Date: 10/12/2020 Goal Status: Active Interventions: Assess HgA1c results as ordered upon admission and as needed Assess patient nutrition upon admission and as needed per policy Treatment Activities: Patient referred to Primary Care Physician for further nutritional evaluation : 09/14/2020 Notes: Wound/Skin Impairment Nursing Diagnoses: Impaired tissue integrity Knowledge deficit related to ulceration/compromised skin integrity Goals: Patient/caregiver will verbalize understanding of skin care regimen Date Initiated: 09/14/2020 Target Resolution Date: 10/12/2020 Goal Status: Active Ulcer/skin breakdown will have a volume reduction of 30% by week 4 Date Initiated: 09/14/2020 Target Resolution Date: 10/12/2020 Goal Status: Active Interventions: Assess patient/caregiver ability to obtain necessary  supplies Assess patient/caregiver ability to perform ulcer/skin care regimen upon admission and as needed Assess ulceration(s) every visit Provide education on ulcer and skin care Treatment Activities: Skin care regimen initiated : 09/14/2020 Topical wound management initiated : 09/14/2020 Notes: Electronic Signature(s) Signed: 09/29/2020 5:51:10 PM By: Shawn Stall Entered By: Shawn Stall on 09/29/2020 08:07:45 -------------------------------------------------------------------------------- Pain Assessment Details Patient Name: Date of Service: [name] [name] [name] P. 09/29/2020 8:00 A M Medical Record Number: [id number] Patient Account Number: [id number] Date of Birth/Sex: Treating RN: 09/10/1955 (65 y.o. [name] [name] Primary Care Hara Milholland: [name] [name] Other Clinician: Referring Zamari Bonsall: Treating Irish Breisch/Extender: [name] [name], [name] [name] in Treatment: 2 Active Problems Location of Pain Severity and Description of Pain Patient Has Paino No Site Locations Pain Management and Medication Current Pain Management: Electronic Signature(s) Signed: 09/30/2020 4:25:56 PM By: [name] Abts RN, BSN Entered By: [name] [name] on 09/29/2020 08:19:49 -------------------------------------------------------------------------------- Patient/Caregiver Education Details Patient Name: Date of Service: Emmie Niemann [name] P. 2/17/2022andnbsp8:00 A M Medical Record Number: [id number] Patient Account Number: [id number] Date of Birth/Gender: Treating RN: 1956-03-25 (65 y.o. Arta Silence Primary Care Physician: [name] [name] Other Clinician: Referring Physician: Treating Physician/Extender: [name] [name], Brooks Sailors in Treatment: 2 Education Assessment Education Provided To: Patient Education Topics Provided Wound/Skin Impairment: Handouts: Skin Care Do's and  Dont's Methods: Explain/Verbal Responses: Reinforcements needed Electronic Signature(s) Signed:  09/29/2020 5:51:10 PM By: Shawn Stall Entered By: Shawn Stall on 09/29/2020 08:07:55 -------------------------------------------------------------------------------- Wound Assessment Details Patient Name: Date of Service: [name] [name] [name] P. 09/29/2020 8:00 A M Medical Record Number: [id number] Patient Account Number: [id number] Date of Birth/Sex: Treating RN: 06-Jun-1956 (65 y.o. [name] [name] Primary Care Mai Longnecker: [name] [name] Other Clinician: Referring Macel Yearsley: Treating Loralyn Rachel/Extender: [name] [name], [name] [name] in Treatment: 2 Wound Status Wound Number: 2 Primary Etiology: Diabetic Wound/Ulcer of the Lower Extremity Wound Location: Right T Great oe Wound Status: Open Wounding Event: Gradually Appeared Comorbid History: Anemia, Hypertension, Type II Diabetes, Neuropathy Date Acquired: 05/08/2020 [name] Of Treatment: 2 Clustered Wound: No Wound Measurements Length: (cm) 0.3 Width: (cm) 0.3 Depth: (cm) 0.2 Area: (cm) 0.071 Volume: (cm) 0.014 % Reduction in Area: 54.8% % Reduction in Volume: 54.8% Epithelialization: None Tunneling: No Undermining: Yes Starting Position (o'clock): 12 Ending Position (o'clock): 12 Maximum Distance: (cm) 0.3 Wound Description Classification: Grade 2 Wound Margin: Well defined, not attached Exudate Amount: Small Exudate Type: Serosanguineous Exudate Color: red, brown Foul Odor After Cleansing: No Slough/Fibrino Yes Wound Bed Granulation Amount: Large (67-100%) Exposed Structure Granulation Quality: Red Fascia Exposed: No Necrotic Amount: None Present (0%) Fat Layer (Subcutaneous Tissue) Exposed: Yes Tendon Exposed: No Muscle Exposed: No Joint Exposed: No Bone Exposed: No Treatment Notes Wound #2 (Toe Great) Wound Laterality: Right Cleanser Soap and Water Discharge Instruction: May shower and wash wound with dial antibacterial soap and water prior to dressing change. Wound Cleanser Discharge Instruction:  Cleanse the wound with wound cleanser prior to applying a clean dressing using gauze sponges, not tissue or cotton balls. Peri-Wound Care Topical Primary Dressing Promogran Prisma Matrix, 4.34 (sq in) (silver collagen) Discharge Instruction: Moisten collagen with saline or hydrogel Secondary Dressing Woven Gauze Sponges 2x2 in Discharge Instruction: Apply over primary dressing as directed.also apply roll gauze under toe to straighten out toe. Secured With [employer] Stretch Gauze Bandage, Sterile 2x75 (in/in) Discharge Instruction: Secure with stretch gauze as directed. [employer] Self Adherent Cohesive Bandage, Non-Sterile,1x5 (in/yd) Compression Wrap Compression Stockings Add-Ons Electronic Signature(s) Signed: 09/30/2020 4:25:56 PM By: [name] Abts RN, BSN Entered By: [name] [name] on 09/29/2020 08:21:56 -------------------------------------------------------------------------------- Vitals Details Patient Name: Date of Service: [name] [name] [name] P. 09/29/2020 8:00 A M Medical Record Number: [id number] Patient Account Number: [id number] Date of Birth/Sex: Treating RN: September 30, 1955 (65 y.o. [name] [name] Primary Care Nikya Busler: [name] [name] Other Clinician: Referring Swan Zayed: Treating Jourdon Zimmerle/Extender: [name] [name], [name] [name] in Treatment: 2 Vital Signs Time Taken: 08:18 Temperature (F): 97.9 Height (in): 62 Pulse (bpm): 66 Weight (lbs): 169 Respiratory Rate (breaths/min): 20 Body Mass Index (BMI): 30.9 Blood Pressure (mmHg): 122/59 Capillary Blood Glucose (mg/dl): 254 Reference Range: 80 - 120 mg / dl Notes glucose per pt report Electronic Signature(s) Signed: 09/30/2020 4:25:56 PM By: [name] Abts RN, BSN Entered By: [name] [name] on 09/29/2020 08:19:41

## 2020-10-03 ENCOUNTER — Encounter (HOSPITAL_BASED_OUTPATIENT_CLINIC_OR_DEPARTMENT_OTHER): Payer: BC Managed Care – PPO | Admitting: Internal Medicine

## 2020-10-04 ENCOUNTER — Other Ambulatory Visit: Payer: Self-pay

## 2020-10-04 ENCOUNTER — Encounter (HOSPITAL_BASED_OUTPATIENT_CLINIC_OR_DEPARTMENT_OTHER): Payer: BC Managed Care – PPO | Admitting: Internal Medicine

## 2020-10-04 DIAGNOSIS — E1142 Type 2 diabetes mellitus with diabetic polyneuropathy: Secondary | ICD-10-CM | POA: Diagnosis not present

## 2020-10-04 DIAGNOSIS — L97512 Non-pressure chronic ulcer of other part of right foot with fat layer exposed: Secondary | ICD-10-CM | POA: Diagnosis not present

## 2020-10-04 DIAGNOSIS — E11319 Type 2 diabetes mellitus with unspecified diabetic retinopathy without macular edema: Secondary | ICD-10-CM | POA: Diagnosis not present

## 2020-10-04 DIAGNOSIS — I10 Essential (primary) hypertension: Secondary | ICD-10-CM | POA: Diagnosis not present

## 2020-10-04 DIAGNOSIS — E11621 Type 2 diabetes mellitus with foot ulcer: Secondary | ICD-10-CM | POA: Diagnosis not present

## 2020-10-04 NOTE — Progress Notes (Signed)
[name] [name], [name] [name] ([id number]) Visit Report for 10/04/2020 Arrival Information Details Patient Name: Date of Service: [place], [place] [name] 10/04/2020 8:15 A M Medical Record Number: [id number] Patient Account Number: [id number] Date of Birth/Sex: Treating RN: Jun 27, 1956 (65 y.o. [name] [name], [name] [name] Primary Care [name] [name]: [name] [name] Other Clinician: Referring [name] [name]: Treating [name] [name]/Extender: [name] [name], [name] [name] in Treatment: 2 Visit Information History Since Last Visit Added or deleted any medications: No Patient Arrived: Ambulatory Any new allergies or adverse reactions: No Arrival Time: 08:40 Had a fall or experienced change in No Accompanied By: husband activities of daily living that may affect Transfer Assistance: None risk of falls: Patient Identification Verified: Yes Signs or symptoms of abuse/neglect since last visito No Secondary Verification Process Completed: Yes Hospitalized since last visit: No Patient Requires Transmission-Based Precautions: No Implantable device outside of the clinic excluding No Patient Has Alerts: No cellular tissue based products placed in the center since last visit: Has Dressing in Place as Prescribed: Yes Pain Present Now: No Electronic Signature(s) Signed: 10/04/2020 9:47:25 AM By: [name] [name] Entered By: [name] [name] on 10/04/2020 08:40:57 -------------------------------------------------------------------------------- Encounter Discharge Information Details Patient Name: Date of Service: [name], [name] [name] 10/04/2020 8:15 A M Medical Record Number: [id number] Patient Account Number: [id number] Date of Birth/Sex: Treating RN: 1955/09/14 (65 y.o. [name] [name] Primary Care Darlean Warmoth: [name] [name] Other Clinician: Referring [name] [name]: Treating Kawhi Diebold/Extender: [name] [name], [name] [name] in Treatment: 2 Encounter Discharge Information Items Post Procedure Vitals Discharge Condition:  Stable Temperature (F): 97.8 Ambulatory Status: Ambulatory Pulse (bpm): 65 Discharge Destination: Home Respiratory Rate (breaths/min): 20 Transportation: Private Auto Blood Pressure (mmHg): 154/75 Accompanied By: husband Schedule Follow-up Appointment: Yes Clinical Summary of Care: Patient Declined Electronic Signature(s) Signed: 10/04/2020 5:33:10 PM By: [name] Abts RN, BSN Entered By: [name] [name] on 10/04/2020 10:40:48 -------------------------------------------------------------------------------- Lower Extremity Assessment Details Patient Name: Date of Service: [name] [name], [name] [name] 10/04/2020 8:15 A M Medical Record Number: [id number] Patient Account Number: [id number] Date of Birth/Sex: Treating RN: 23-Aug-1955 (65 y.o. [name] [name] Primary Care [name] [name]: [name] [name] Other Clinician: Referring [name]: Treating [name] [name]/Extender: [name] [name], [name] [name] in Treatment: 2 Edema Assessment Assessed: [Left: No] [Right: No] Edema: [Left: N] [Right: o] Calf Left: Right: Point of Measurement: 28 cm From Medial Instep 38 cm Ankle Left: Right: Point of Measurement: 10 cm From Medial Instep 21 cm Vascular Assessment Pulses: Dorsalis Pedis Palpable: [Right:Yes] Electronic Signature(s) Signed: 10/04/2020 5:33:10 PM By: [name] Abts RN, BSN Entered By: [name] [name] on 10/04/2020 08:51:23 -------------------------------------------------------------------------------- Multi Wound Chart Details Patient Name: Date of Service: [name] [name], [name] [name] 10/04/2020 8:15 A M Medical Record Number: [id number] Patient Account Number: [id number] Date of Birth/Sex: Treating RN: 07-11-1956 (65 y.o. [name] [name], [name] [name] Primary Care [name] [name]: [name] [name] Other Clinician: Referring [name] [name]: Treating [name] [name]/Extender: [name] [name], [name] [name] in Treatment: 2 Vital Signs Height(in): 62 Capillary Blood Glucose(mg/dl): 373 Weight(lbs): 428 Pulse(bpm):  65 Body Mass Index(BMI): 31 Blood Pressure(mmHg): 154/75 Temperature(F): 97.8 Respiratory Rate(breaths/min): 20 Photos: [2:No Photos Right T Great oe] [N/A:N/A N/A] Wound Location: [2:Gradually Appeared] [N/A:N/A] Wounding Event: [2:Diabetic Wound/Ulcer of the Lower] [N/A:N/A] Primary Etiology: [2:Extremity Anemia, Hypertension, Type II] [N/A:N/A] Comorbid History: [2:Diabetes, Neuropathy 05/08/2020] [N/A:N/A] Date Acquired: [2:2] [N/A:N/A] [name] of Treatment: [2:Open] [N/A:N/A] Wound Status: [2:0.3x0.3x0.2] [N/A:N/A] Measurements L x W x D (cm) [2:0.071] [N/A:N/A] A (cm) : rea [2:0.014] [N/A:N/A] Volume (cm) : [2:54.80%] [N/A:N/A] % Reduction in A rea: [2:54.80%] [N/A:N/A] % Reduction in Volume: [2:Grade 2] [N/A:N/A] Classification: [2:Small] [N/A:N/A]  Exudate A mount: [2:Serosanguineous] [N/A:N/A] Exudate Type: [2:red, brown] [N/A:N/A] Exudate Color: [2:Well defined, not attached] [N/A:N/A] Wound Margin: [2:Large (67-100%)] [N/A:N/A] Granulation A mount: [2:Pink] [N/A:N/A] Granulation Quality: [2:None Present (0%)] [N/A:N/A] Necrotic A mount: [2:Fat Layer (Subcutaneous Tissue): Yes N/A] Exposed Structures: [2:Fascia: No Tendon: No Muscle: No Joint: No Bone: No Small (1-33%)] [N/A:N/A] Epithelialization: [2:Debridement - Excisional] [N/A:N/A] Debridement: Pre-procedure Verification/Time Out 09:17 [N/A:N/A] Taken: [2:Lidocaine] [N/A:N/A] Pain Control: [2:Callus, Subcutaneous] [N/A:N/A] Tissue Debrided: [2:Skin/Subcutaneous Tissue] [N/A:N/A] Level: [2:0.09] [N/A:N/A] Debridement A (sq cm): [2:rea Curette] [N/A:N/A] Instrument: [2:Minimum] [N/A:N/A] Bleeding: [2:Pressure] [N/A:N/A] Hemostasis A chieved: [2:0] [N/A:N/A] Procedural Pain: [2:0] [N/A:N/A] Post Procedural Pain: [2:Procedure was tolerated well] [N/A:N/A] Debridement Treatment Response: [2:0.3x0.3x0.2] [N/A:N/A] Post Debridement Measurements L x W x D (cm) [2:0.014] [N/A:N/A] Post Debridement Volume: (cm)  [2:Debridement] [N/A:N/A] Treatment Notes Electronic Signature(s) Signed: 10/04/2020 5:22:30 PM By: Baltazar Najjar MD Signed: 10/04/2020 5:43:17 PM By: Fonnie Mu RN Entered By: Baltazar Najjar on 10/04/2020 09:43:03 -------------------------------------------------------------------------------- Multi-Disciplinary Care Plan Details Patient Name: Date of Service: [facility], [name] [name] 10/04/2020 8:15 A M Medical Record Number: [id number] Patient Account Number: [id number] Date of Birth/Sex: Treating RN: 30-Apr-1956 (65 y.o. [name] [name], [name] [name] Primary Care Jeptha Hinnenkamp: [name] [name] Other Clinician: Referring Kathyjo Briere: Treating Yichen Gilardi/Extender: [name] [name], Brooks Sailors in Treatment: 2 Multidisciplinary Care Plan reviewed with physician Active Inactive Nutrition Nursing Diagnoses: Impaired glucose control: actual or potential Potential for alteratiion in Nutrition/Potential for imbalanced nutrition Goals: Patient/caregiver will maintain therapeutic glucose control Date Initiated: 09/14/2020 Target Resolution Date: 10/12/2020 Goal Status: Active Interventions: Assess HgA1c results as ordered upon admission and as needed Assess patient nutrition upon admission and as needed per policy Treatment Activities: Patient referred to Primary Care Physician for further nutritional evaluation : 09/14/2020 Notes: Wound/Skin Impairment Nursing Diagnoses: Impaired tissue integrity Knowledge deficit related to ulceration/compromised skin integrity Goals: Patient/caregiver will verbalize understanding of skin care regimen Date Initiated: 09/14/2020 Target Resolution Date: 10/12/2020 Goal Status: Active Ulcer/skin breakdown will have a volume reduction of 30% by week 4 Date Initiated: 09/14/2020 Target Resolution Date: 10/12/2020 Goal Status: Active Interventions: Assess patient/caregiver ability to obtain necessary supplies Assess patient/caregiver ability to perform ulcer/skin care  regimen upon admission and as needed Assess ulceration(s) every visit Provide education on ulcer and skin care Treatment Activities: Skin care regimen initiated : 09/14/2020 Topical wound management initiated : 09/14/2020 Notes: Electronic Signature(s) Signed: 10/04/2020 5:43:17 PM By: Fonnie Mu RN Entered By: Fonnie Mu on 10/04/2020 08:20:06 -------------------------------------------------------------------------------- Pain Assessment Details Patient Name: Date of Service: [name] [name], [name] [name] 10/04/2020 8:15 A M Medical Record Number: [id number] Patient Account Number: [id number] Date of Birth/Sex: Treating RN: [birth year] (65 y.o. [name] [name], [name] [name] Primary Care Ellanora Rayborn: [name] [name] Other Clinician: Referring Cyera Balboni: Treating Courtney Bellizzi/Extender: [name] [name], [name] [name] in Treatment: 2 Active Problems Location of Pain Severity and Description of Pain Patient Has Paino No Site Locations Pain Management and Medication Current Pain Management: Electronic Signature(s) Signed: 10/04/2020 9:47:25 AM By: [name] [name] Signed: 10/04/2020 5:43:17 PM By: Fonnie Mu RN Entered By: [name] [name] on 10/04/2020 08:41:34 -------------------------------------------------------------------------------- Patient/Caregiver Education Details Patient Name: Date of Service: Emmie Niemann [name] 2/22/2022andnbsp8:15 A M Medical Record Number: [id number] Patient Account Number: [id number] Date of Birth/Gender: Treating RN: 03-09-1956 (65 y.o. Toniann Fail Primary Care Physician: [name] [name] Other Clinician: Referring Physician: Treating Physician/Extender: [name] [name], Brooks Sailors in Treatment: 2 Education Assessment Education Provided To: Patient Education Topics Provided Wound/Skin Impairment: Methods: Explain/Verbal Responses: State content correctly Electronic Signature(s) Signed: 10/04/2020 5:43:17 PM  By: Fonnie Mu  RN Entered By: Fonnie Mu on 10/04/2020 08:20:17 -------------------------------------------------------------------------------- Wound Assessment Details Patient Name: Date of Service: [place], [name] [name] 10/04/2020 8:15 A M Medical Record Number: [id number] Patient Account Number: [id number] Date of Birth/Sex: Treating RN: April 17, 1956 (65 y.o. [name] [name], [name] [name] Primary Care Dallana Mavity: [name] [name] Other Clinician: Referring Quincie Haroon: Treating Baila Rouse/Extender: [name] [name], [name] [name] in Treatment: 2 Wound Status Wound Number: 2 Primary Etiology: Diabetic Wound/Ulcer of the Lower Extremity Wound Location: Right T Great oe Wound Status: Open Wounding Event: Gradually Appeared Comorbid History: Anemia, Hypertension, Type II Diabetes, Neuropathy Date Acquired: 05/08/2020 [name] Of Treatment: 2 Clustered Wound: No Wound Measurements Length: (cm) 0.3 Width: (cm) 0.3 Depth: (cm) 0.2 Area: (cm) 0.071 Volume: (cm) 0.014 % Reduction in Area: 54.8% % Reduction in Volume: 54.8% Epithelialization: Small (1-33%) Tunneling: No Undermining: No Wound Description Classification: Grade 2 Wound Margin: Well defined, not attached Exudate Amount: Small Exudate Type: Serosanguineous Exudate Color: red, brown Foul Odor After Cleansing: No Slough/Fibrino No Wound Bed Granulation Amount: Large (67-100%) Exposed Structure Granulation Quality: Pink Fascia Exposed: No Necrotic Amount: None Present (0%) Fat Layer (Subcutaneous Tissue) Exposed: Yes Tendon Exposed: No Muscle Exposed: No Joint Exposed: No Bone Exposed: No Treatment Notes Wound #2 (Toe Great) Wound Laterality: Right Cleanser Wound Cleanser Discharge Instruction: Cleanse the wound with wound cleanser prior to applying a clean dressing using gauze sponges, not tissue or cotton balls. Peri-Wound Care Topical Primary Dressing IODOFLEX 0.9% Cadexomer Iodine Pad 4x6 cm Discharge Instruction: Apply to  wound bed as instructed Secondary Dressing Woven Gauze Sponge, Non-Sterile 4x4 in Discharge Instruction: Apply over primary dressing as directed. Secured With Conforming Stretch Gauze Bandage, Sterile 2x75 (in/in) Discharge Instruction: Secure with stretch gauze as directed. 92M Medipore H Soft Cloth Surgical T 4 x 2 (in/yd) ape Discharge Instruction: Secure dressing with tape as directed. Compression Wrap Compression Stockings Add-Ons Electronic Signature(s) Signed: 10/04/2020 5:33:10 PM By: [name] Abts RN, BSN Signed: 10/04/2020 5:43:17 PM By: Fonnie Mu RN Entered By: [name] [name] on 10/04/2020 08:51:42 -------------------------------------------------------------------------------- Vitals Details Patient Name: Date of Service: [name], [name] [name] 10/04/2020 8:15 A M Medical Record Number: [id number] Patient Account Number: [id number] Date of Birth/Sex: Treating RN: [birth year] (65 y.o. [name] [name], [name] [name] Primary Care Quantavious Eggert: [name] [name] Other Clinician: Referring Talaysia Pinheiro: Treating Brenlyn Beshara/Extender: [name] [name], [name] [name] in Treatment: 2 Vital Signs Time Taken: 08:40 Temperature (F): 97.8 Height (in): 62 Pulse (bpm): 65 Weight (lbs): 169 Respiratory Rate (breaths/min): 20 Body Mass Index (BMI): 30.9 Blood Pressure (mmHg): 154/75 Capillary Blood Glucose (mg/dl): 782 Reference Range: 80 - 120 mg / dl Electronic Signature(s) Signed: 10/04/2020 9:47:25 AM By: [name] [name] Entered By: [name] [name] on 10/04/2020 08:41:20

## 2020-10-04 NOTE — Progress Notes (Signed)
[name] [name] ([id number]) Visit Report for 10/04/2020 Debridement Details Patient Name: Date of Service: [name] [name], [name] 10/04/2020 8:15 A M Medical Record Number: [id number] Patient Account Number: [id number] Date of Birth/Sex: Treating RN: [birth year] (65 y.o. [name] [name], [name] [name] Primary Care Provider: Elvia Collum Other Clinician: Referring Provider: Treating Provider/Extender: Melody Haver, Malena Weeks in Treatment: 2 Debridement Performed for Assessment: Wound #2 Right T Great oe Performed By: Physician Ricard Dillon., MD Debridement Type: Debridement Severity of Tissue Pre Debridement: Fat layer exposed Level of Consciousness (Pre-procedure): Awake and Alert Pre-procedure Verification/Time Out Yes - 09:17 Taken: Start Time: 09:17 Pain Control: Lidocaine T Area Debrided (L x W): otal 0.3 (cm) x 0.3 (cm) = 0.09 (cm) Tissue and other material debrided: Viable, Non-Viable, Callus, Slough, Skin: Dermis , Skin: Epidermis, Slough Level: Skin/Epidermis Debridement Description: Selective/Open Wound Instrument: Curette Bleeding: Minimum Hemostasis Achieved: Pressure End Time: 09:18 Procedural Pain: 0 Post Procedural Pain: 0 Response to Treatment: Procedure was tolerated well Level of Consciousness (Post- Awake and Alert procedure): Post Debridement Measurements of Total Wound Length: (cm) 0.3 Width: (cm) 0.3 Depth: (cm) 0.2 Volume: (cm) 0.014 Character of Wound/Ulcer Post Debridement: Improved Severity of Tissue Post Debridement: Fat layer exposed Post Procedure Diagnosis Same as Pre-procedure Electronic Signature(s) Signed: 10/04/2020 5:22:30 PM By: Linton Ham MD Signed: 10/04/2020 5:43:17 PM By: Rhae Hammock RN Entered By: Linton Ham on 10/04/2020 09:43:21 -------------------------------------------------------------------------------- HPI Details Patient Name: Date of Service: [name] [name], [name] 10/04/2020 8:15 A M Medical Record  Number: [id number] Patient Account Number: [id number] Date of Birth/Sex: Treating RN: 1955-11-11 (65 y.o. [name] [name], [name] [name] Primary Care Provider: Elvia Collum Other Clinician: Referring Provider: Treating Provider/Extender: Melody Haver, Malena Weeks in Treatment: 2 History of Present Illness HPI Description: 65 year old female referred to [place] by her PCP Dr. Delrae Rend, with a past medical history of type 2 diabetes mellitus with diabetic polyneuropathy, diabetic retinopathy, hyperglycemia, fracture of right great toe, diabetic foot ulcer. she has never been a smoker. The patient has had a lower extremity arterial study done in April of this year and the right ABI was 1.27 and the left ABI was 1.14. The right toe brachial index was 0.60 and the left was 0.59. The impression was no evidence of hemodynamically significant lower extremity arterial occlusive disease through the ankles bilaterally. Decreased pulse volume recordings of the left great toe suggests distal small vessel disease. note is made of the fact that the patient was seen in early April of this year in the ED with what was thought to be a cellulitis and was put on doxycycline and was asked to follow-up with her PCP. x-ray done at that stage revealed -- IMPRESSION:There is a corner fracture at medial base of distal phalanx great toe. Clinical correlation is necessary to exclude recurrent fracture or nonunion of previous fracture. There is diffuse soft tissue swelling great toe. The patient said she had some back surgery in 2015 which led to what sounds like a foot drop and she has been having problems raising her foot and walking well and the right big toe was injured and fractured a while ago. She also mentioned she does not take very good care of her diabetes diet. 01/24/2016 -- right foot x-ray -- IMPRESSION:There is healing corner fracture at the medial base of distal phalanx great toe with partial union. There is  soft tissue swelling right great toe. On oblique view there is subtle cortical irregularity distal medial aspect of proximal phalanx great  toe. Osteomyelitis cannot be excluded. Clinical correlation is necessary.Further correlation with MRI is recommended as clinically warranted. In view of the above findings we will order a MRI, which has not been done yet and see her back next week with this result. 01/31/2016 - MRI of the right foot -- IMPRESSION:1. Diffuse edema and enhancement of the entire distal phalanx of the great toe within the adjacent soft tissue ulceration and soft tissue devitalization. I suspect this represents early diffuse osteomyelitis. 2. No evidence of osteomyelitis of the proximal phalanx of the great toe. 3. Probable cellulitis along the lateral aspect of the first metatarsal. 02/07/2016 -- she is still awaiting insurance clearance to start her hyperbaric oxygen therapy and the appointment to see Dr. Mayo Ao. 03/24/2016 -- the patient has been continuing with hyperbaric oxygen therapy and has finished 17 treatments. She has a infectious disease consult with Dr. Linus Salmons tomorrow She was seen by Dr. Mayo Ao on 03/09/2016 and his plan was to start the patient on clindamycin and treat her for osteomyelitis. So far the patient has been on oral antibiotics The pathology was negative for osteomyelitis however the bone culture was positive. We will get this report from Dr. Pasty Arch office. Addendum : we have received the reports from Dr. Pasty Arch office and the pathology showed benign bone with new bone formation and no evidence of significant inflammation. Culture report was a Beta Hemolytic Streptococcus which was sensitive penicillins and other beta lactams and rare Staphylococcus aureus, MSSA, which was sensitive to tetracycline and clindamycin and Bactrim ciprofloxacin and oxacillin. 03/28/2016 -- was seen by Dr. Scharlene Gloss who reviewed her history and physical and  x-rays and for the culture positive bone biopsy with negative pathology for osteomyelitis he did not recommend long-term IV antibiotics. T treat her orally for 21 days and because it was pansensitive recommended o Keflex 500 mg 4 times a day for 21 days. He would see her back in 4 weeks to recheck an ESR and CRP and if worse would consider IV antibiotics at that time. 04/04/2016 -- she dropped a sharp object on her left dorsum of the foot a couple of days ago but there was a superficial abrasion and no deep open wound. 04/18/2016 -- she was seen by Dr. Berton Bon of infectious disease who plan to stop antibiotics and observe and check her CRP and ESR and she would follow when necessary for concerns. the sedimentation rate was 4 mm per hour, and the CRP was less than 0.5 mg per DL she was also seen by Dr. Mayo Ao who did an x-ray of the right foot but his notes and report is not available yet. 04/25/2016 -- I have reviewed the notes from Dr. Mayo Ao who saw her on 04/13/2016. no x-rays were taken in follow-up. The patient was asked to continue with care at the wound center. 05/09/2016 -- she was reviewed by Dr. Mayo Ao on Monday and he has discussed getting her diabetic shoes made. Other than that no changes were recommended. 05/15/16 patient arrives today with the wound on the tip of her great toe measuring at 0.6 cm. This probes down to bony surface. From what I understand this is a deterioration from last visit. She is using Prisma and changing this daily at home she has an offloading boot. 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 She cannot wear an offloading boot 05/24/16; using Prisma. We are making attempts to more aggressively offload the right great toe which is flexed at  the interphalangeal joint by supporting this and keeping it forward in her footwear. 06/13/2016 -- the patient has had no discharge tenderness or cellulitis of the right great toe. Was seen by Dr. Mayo Ao on 06/08/2016 and an x-ray was done by him which showed there was some cortical changes at the distal aspect of the hallux but there was no extension or worsening of osteomyelitis and there was no soft tissue emphysema. He had made arrangements for her diabetic shoes but was awaiting insurance clearance. Readmission: 09/14/2020 upon evaluation today patient appears to be doing somewhat poorly in regard to her right great toe ulcer. She tells me currently that she has been having issues with this since September 2021. She has been seen by Dr. Earleen Newport. With that being said I did review the notes. It does appear that the patient has some issue with her great toe some contraction causes this to plantar flex which unfortunately makes the end of the toe actually rub on any shoe she wears. She has a PEG assist offloading shoe but unfortunately she is worn a hole while her big toe is which means that when she wears this her toe does fix right down until it and then continues to wrap around. Nonetheless this is not optimal and in fact is not even going to be beneficial for her. She did have an x- ray on December 23 done by Dr. Earleen Newport there was no evidence of bony destruction to indicate osteomyelitis according to his note. I agree it does not appear to be quite that significant visually. Patient's last hemoglobin A1c stated to be 8.0 in December 2021. She has been on Augmentin as prescribed by Dr. Earleen Newport. She does have a PEG assist offloading shoe but as mentioned above is really not functioning appropriately for her at this point. Again all this started in September and really she tells me has been a frustration for her she still out of work and that is given her some concern and trouble as well. There  is not appear to be any evidence of local or systemic infection. No fevers, chills, nausea, vomiting, or diarrhea. The patient does have hypertension. 2/8; patient admitted to the clinic last week. She is a type II diabetic with peripheral neuropathy. She does not appear to have any vascular issues based on a normal ABI and clinical exam. She has been dealing with a wound on the tip of her right great toe for as long as 5 months. Followed with Dr. Earleen Newport at [facility]. She was using silver collagen although her supplies were delayed and arriving. She arrives today with a probing tunnel going medially subcutaneously from the original wound. She is offloading this in a surgical shoe. She works at [employer] but is off work on short-term disability. [Cannot work with an open toed shoe]. 2/17 in general things look better. She is using silver collagen gauze and a light Coban over the toe. Her x-ray from last week was negative for osteomyelitis culture only grew Corynebacterium likely a skin contaminant 2/22; using silver collagen. Culture and x-ray were negative last week. The tunneling area laterally has closed over nice epithelialization. Still debris over the surface of the original small wound this cleans up quite  nicely with debridement there is undermining and perhaps 2 to 3 mm of depth Electronic Signature(s) Signed: 10/04/2020 5:22:30 PM By: Linton Ham MD Entered By: Linton Ham on 10/04/2020 09:45:02 -------------------------------------------------------------------------------- Physical Exam Details Patient Name: Date of Service: [name] [name], [name] 10/04/2020 8:15 A M Medical Record Number: [id number] Patient Account Number: [id number] Date of Birth/Sex: Treating RN: 1955-10-03 (65 y.o. [name] [name], [name] [name] Primary Care Provider: Elvia Collum Other Clinician: Referring Provider: Treating Provider/Extender: Melody Haver, Malena Weeks in Treatment:  2 Constitutional Patient is hypertensive.. Pulse regular and within target range for patient.Marland Kitchen Respirations regular, non-labored and within target range.. Temperature is normal and within the target range for the patient.Marland Kitchen Appears in no distress. Notes Wound exam; tip of her right great toe the part that was going laterally is totally epithelialized. The small original area still has some depth I removed slough skin. The base of the wound appears healthy but still with 2 to 3 mm of depth there is no exposed bone Electronic Signature(s) Signed: 10/04/2020 5:22:30 PM By: Linton Ham MD Entered By: Linton Ham on 10/04/2020 09:46:44 -------------------------------------------------------------------------------- Physician Orders Details Patient Name: Date of Service: [name] [name], [name] 10/04/2020 8:15 A M Medical Record Number: [id number] Patient Account Number: [id number] Date of Birth/Sex: Treating RN: [birth year] (65 y.o. [name] [name], [name] [name] Primary Care Provider: Elvia Collum Other Clinician: Referring Provider: Treating Provider/Extender: Melody Haver, Malena Weeks in Treatment: 2 Verbal / Phone Orders: No Diagnosis Coding Follow-up Appointments ppointment in 1 week. - Tuesday Return A Bathing/ Shower/ Hygiene May shower and wash wound with soap and water. - with dressing changes Off-Loading Open toe surgical shoe to: - right foot with felt pad to offload great toe Wound Treatment Wound #2 - T Great oe Wound Laterality: Right Cleanser: Wound Cleanser Discharge Instructions: Cleanse the wound with wound cleanser prior to applying a clean dressing using gauze sponges, not tissue or cotton balls. Prim Dressing: IODOFLEX 0.9% Cadexomer Iodine Pad 4x6 cm ary Discharge Instructions: Apply to wound bed as instructed Secondary Dressing: Woven Gauze Sponge, Non-Sterile 4x4 in Discharge Instructions: Apply over primary dressing as directed. Secured With: Hotel manager, Sterile 2x75 (in/in) Discharge Instructions: Secure with stretch gauze as directed. Secured With: 76M Medipore H Soft Cloth Surgical T 4 x 2 (in/yd) ape Discharge Instructions: Secure dressing with tape as directed. Electronic Signature(s) Signed: 10/04/2020 5:22:30 PM By: Linton Ham MD Signed: 10/04/2020 5:43:17 PM By: Rhae Hammock RN Entered By: Rhae Hammock on 10/04/2020 09:20:03 -------------------------------------------------------------------------------- Problem List Details Patient Name: Date of Service: [facility], [name] 10/04/2020 8:15 A M Medical Record Number: [id number] Patient Account Number: [id number] Date of Birth/Sex: Treating RN: 04-18-1956 (65 y.o. [name] [name], [name] [name] Primary Care Provider: Elvia Collum Other Clinician: Referring Provider: Treating Provider/Extender: Melody Haver, Malena Weeks in Treatment: 2 Active Problems ICD-10 Encounter Code Description Active Date MDM Diagnosis E11.621 Type 2 diabetes mellitus with foot ulcer 09/14/2020 No Yes L97.512 Non-pressure chronic ulcer of other part of right foot with fat layer exposed 09/14/2020 No Yes E11.42 Type 2 diabetes mellitus with diabetic polyneuropathy 09/14/2020 No Yes I10 Essential (primary) hypertension 09/14/2020 No Yes Inactive Problems Resolved Problems Electronic Signature(s) Signed: 10/04/2020 5:22:30 PM By: Linton Ham MD Entered By: Linton Ham on 10/04/2020 09:42:55 -------------------------------------------------------------------------------- Progress Note Details Patient Name: Date of Service: [name] [name], [name] 10/04/2020 8:15 A M Medical Record Number: [id number] Patient Account Number: [id number] Date of Birth/Sex: Treating RN: 1956/04/22 (65 y.o. [name] [name], [name] [name]  Primary Care Provider: Elvia Collum Other Clinician: Referring Provider: Treating Provider/Extender: Melody Haver, Malena Weeks in Treatment:  2 Subjective History of Present Illness (HPI) 65 year old female referred to [place] by her PCP Dr. Delrae Rend, with a past medical history of type 2 diabetes mellitus with diabetic polyneuropathy, diabetic retinopathy, hyperglycemia, fracture of right great toe, diabetic foot ulcer. she has never been a smoker. The patient has had a lower extremity arterial study done in April of this year and the right ABI was 1.27 and the left ABI was 1.14. The right toe brachial index was 0.60 and the left was 0.59. The impression was no evidence of hemodynamically significant lower extremity arterial occlusive disease through the ankles bilaterally. Decreased pulse volume recordings of the left great toe suggests distal small vessel disease. note is made of the fact that the patient was seen in early April of this year in the ED with what was thought to be a cellulitis and was put on doxycycline and was asked to follow-up with her PCP. x-ray done at that stage revealed -- IMPRESSION:There is a corner fracture at medial base of distal phalanx great toe. Clinical correlation is necessary to exclude recurrent fracture or nonunion of previous fracture. There is diffuse soft tissue swelling great toe. The patient said she had some back surgery in 2015 which led to what sounds like a foot drop and she has been having problems raising her foot and walking well and the right big toe was injured and fractured a while ago. She also mentioned she does not take very good care of her diabetes diet. 01/24/2016 -- right foot x-ray -- IMPRESSION:There is healing corner fracture at the medial base of distal phalanx great toe with partial union. There is soft tissue swelling right great toe. On oblique view there is subtle cortical irregularity distal medial aspect of proximal phalanx great toe. Osteomyelitis cannot be excluded. Clinical correlation is necessary.Further correlation with MRI is recommended as clinically  warranted. In view of the above findings we will order a MRI, which has not been done yet and see her back next week with this result. 01/31/2016 - MRI of the right foot -- IMPRESSION:1. Diffuse edema and enhancement of the entire distal phalanx of the great toe within the adjacent soft tissue ulceration and soft tissue devitalization. I suspect this represents early diffuse osteomyelitis. 2. No evidence of osteomyelitis of the proximal phalanx of the great toe. 3. Probable cellulitis along the lateral aspect of the first metatarsal. 02/07/2016 -- she is still awaiting insurance clearance to start her hyperbaric oxygen therapy and the appointment to see Dr. Mayo Ao. 03/24/2016 -- the patient has been continuing with hyperbaric oxygen therapy and has finished 17 treatments. She has a infectious disease consult with Dr. Linus Salmons tomorrow She was seen by Dr. Mayo Ao on 03/09/2016 and his plan was to start the patient on clindamycin and treat her for osteomyelitis. So far the patient has been on oral antibiotics The pathology was negative for osteomyelitis however the bone culture was positive. We will get this report from Dr. Pasty Arch office. Addendum : we have received the reports from Dr. Pasty Arch office and the pathology showed benign bone with new bone formation and no evidence of significant inflammation. Culture report was a Beta Hemolytic Streptococcus which was sensitive penicillins and other beta lactams and rare Staphylococcus aureus, MSSA, which was sensitive to tetracycline and clindamycin and Bactrim ciprofloxacin and oxacillin. 03/28/2016 -- was seen by Dr. Scharlene Gloss who reviewed  her history and physical and x-rays and for the culture positive bone biopsy with negative pathology for osteomyelitis he did not recommend long-term IV antibiotics. T treat her orally for 21 days and because it was pansensitive recommended o Keflex 500 mg 4 times a day for 21 days. He would see  her back in 4 weeks to recheck an ESR and CRP and if worse would consider IV antibiotics at that time. 04/04/2016 -- she dropped a sharp object on her left dorsum of the foot a couple of days ago but there was a superficial abrasion and no deep open wound. 04/18/2016 -- she was seen by Dr. Berton Bon of infectious disease who plan to stop antibiotics and observe and check her CRP and ESR and she would follow when necessary for concerns. the sedimentation rate was 4 mm per hour, and the CRP was less than 0.5 mg per DL she was also seen by Dr. Mayo Ao who did an x-ray of the right foot but his notes and report is not available yet. 04/25/2016 -- I have reviewed the notes from Dr. Mayo Ao who saw her on 04/13/2016. no x-rays were taken in follow-up. The patient was asked to continue with care at the wound center. 05/09/2016 -- she was reviewed by Dr. Mayo Ao on Monday and he has discussed getting her diabetic shoes made. Other than that no changes were recommended. 05/15/16 patient arrives today with the wound on the tip of her great toe measuring at 0.6 cm. This probes down to bony surface. From what I understand this is a deterioration from last visit. She is using Prisma and changing this daily at home she has an offloading boot. 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 She cannot wear an offloading boot 05/24/16; using Prisma. We are making attempts to more aggressively offload the right great toe which is flexed at the interphalangeal joint by supporting this and keeping it forward in her footwear. 06/13/2016 -- the patient has had no discharge tenderness or cellulitis of the right great toe. Was seen by Dr. Mayo Ao on 06/08/2016 and an x-ray was done by him which showed  there was some cortical changes at the distal aspect of the hallux but there was no extension or worsening of osteomyelitis and there was no soft tissue emphysema. He had made arrangements for her diabetic shoes but was awaiting insurance clearance. Readmission: 09/14/2020 upon evaluation today patient appears to be doing somewhat poorly in regard to her right great toe ulcer. She tells me currently that she has been having issues with this since September 2021. She has been seen by Dr. Earleen Newport. With that being said I did review the notes. It does appear that the patient has some issue with her great toe some contraction causes this to plantar flex which unfortunately makes the end of the toe actually rub on any shoe she wears. She has a PEG assist offloading shoe but unfortunately she is worn a hole while her big toe is which means that when she wears this her toe does fix right down until it and then continues to wrap around. Nonetheless this is not optimal and in fact is not even going to be beneficial for her. She did have an  x- ray on December 23 done by Dr. Earleen Newport there was no evidence of bony destruction to indicate osteomyelitis according to his note. I agree it does not appear to be quite that significant visually. Patient's last hemoglobin A1c stated to be 8.0 in December 2021. She has been on Augmentin as prescribed by Dr. Earleen Newport. She does have a PEG assist offloading shoe but as mentioned above is really not functioning appropriately for her at this point. Again all this started in September and really she tells me has been a frustration for her she still out of work and that is given her some concern and trouble as well. There is not appear to be any evidence of local or systemic infection. No fevers, chills, nausea, vomiting, or diarrhea. The patient does have hypertension. 2/8; patient admitted to the clinic last week. She is a type II diabetic with peripheral neuropathy. She does not  appear to have any vascular issues based on a normal ABI and clinical exam. She has been dealing with a wound on the tip of her right great toe for as long as 5 months. Followed with Dr. Earleen Newport at [facility]. She was using silver collagen although her supplies were delayed and arriving. She arrives today with a probing tunnel going medially subcutaneously from the original wound. She is offloading this in a surgical shoe. She works at [employer] but is off work on short-term disability. [Cannot work with an open toed shoe]. 2/17 in general things look better. She is using silver collagen gauze and a light Coban over the toe. Her x-ray from last week was negative for osteomyelitis culture only grew Corynebacterium likely a skin contaminant 2/22; using silver collagen. Culture and x-ray were negative last week. The tunneling area laterally has closed over nice epithelialization. Still debris over the surface of the original small wound this cleans up quite nicely with debridement there is undermining and perhaps 2 to 3 mm of depth Objective Constitutional Patient is hypertensive.. Pulse regular and within target range for patient.Marland Kitchen Respirations regular, non-labored and within target range.. Temperature is normal and within the target range for the patient.Marland Kitchen Appears in no distress. Vitals Time Taken: 8:40 AM, Height: 62 in, Weight: 169 lbs, BMI: 30.9, Temperature: 97.8 F, Pulse: 65 bpm, Respiratory Rate: 20 breaths/min, Blood Pressure: 154/75 mmHg, Capillary Blood Glucose: 200 mg/dl. General Notes: Wound exam; tip of her right great toe the part that was going laterally is totally epithelialized. The small original area still has some depth I removed slough skin. The base of the wound appears healthy but still with 2 to 3 mm of depth there is no exposed bone Integumentary (Hair, Skin) Wound #2 status is Open. Original cause of wound was Gradually Appeared. The date acquired was: 05/08/2020. The  wound has been in treatment 2 weeks. The wound is located on the Right T Great. The wound measures 0.3cm length x 0.3cm width x 0.2cm depth; 0.071cm^2 area and 0.014cm^3 volume. There is Fat oe Layer (Subcutaneous Tissue) exposed. There is no tunneling or undermining noted. There is a small amount of serosanguineous drainage noted. The wound margin is well defined and not attached to the wound base. There is large (67-100%) pink granulation within the wound bed. There is no necrotic tissue within the wound bed. 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 There was a Selective/Open Wound Skin/Epidermis Debridement with a total area of 0.09 sq cm performed by Ricard Dillon., MD. With the following instrument(s): Curette to remove Viable and Non-Viable tissue/material. Material removed includes Callus, Slough, Skin: Dermis, and Skin: Epidermis after achieving pain control using Lidocaine. No specimens were taken. A time out was conducted at 09:17, prior to the start of the procedure. A Minimum amount of bleeding was controlled with Pressure. The procedure was tolerated well with a pain level of 0 throughout and a pain level of 0 following the procedure. Post Debridement Measurements: 0.3cm length x 0.3cm width x 0.2cm depth; 0.014cm^3 volume. Character of Wound/Ulcer Post Debridement is improved. Severity of Tissue Post Debridement is: Fat layer exposed. Post procedure Diagnosis Wound #2: Same as Pre-Procedure Plan Follow-up Appointments: Return Appointment in 1 week. - Tuesday Bathing/ Shower/  Hygiene: May shower and wash wound with soap and water. - with dressing changes Off-Loading: Open toe surgical shoe to: - right foot with felt pad to offload great toe WOUND #2: - T Great Wound Laterality: Right oe Cleanser: Wound Cleanser Discharge Instructions: Cleanse the wound with wound cleanser prior to applying a clean dressing using gauze sponges, not tissue or cotton balls. Prim Dressing: IODOFLEX 0.9% Cadexomer Iodine Pad 4x6 cm ary Discharge Instructions: Apply to wound bed as instructed Secondary Dressing: Woven Gauze Sponge, Non-Sterile 4x4 in Discharge Instructions: Apply over primary dressing as directed. Secured With: Child psychotherapist, Sterile 2x75 (in/in) Discharge Instructions: Secure with stretch gauze as directed. Secured With: 45M Medipore H Soft Cloth Surgical T 4 x 2 (in/yd) ape Discharge Instructions: Secure dressing with tape as directed. 1. I am going to change to Iodoflex to this area to see if we can pull the tissue together and clean up the debris on the surface . #2 require debridement today to get to a viable wound surface Electronic Signature(s) Signed: 10/04/2020 5:22:30 PM By: Linton Ham MD Entered By: Linton Ham on 10/04/2020 09:47:32 -------------------------------------------------------------------------------- SuperBill Details Patient Name: Date of Service: [name] [name], [name] 10/04/2020 Medical Record Number: [id number] Patient Account Number: [id number] Date of Birth/Sex: Treating RN: 1956-03-06 (65 y.o. [name] [name], [name] [name] Primary Care Provider: Elvia Collum Other Clinician: Referring Provider: Treating Provider/Extender: Melody Haver, Malena Weeks in Treatment: 2 Diagnosis Coding ICD-10 Codes Code Description E11.621 Type 2 diabetes mellitus with foot ulcer L97.512 Non-pressure chronic ulcer of other part of right foot with fat layer exposed E11.42 Type 2 diabetes mellitus with diabetic  polyneuropathy I10 Essential (primary) hypertension Facility Procedures CPT4 Code: [id number] Description: [phone] - DEBRIDE WOUND 1ST 20 SQ CM OR < ICD-10 Diagnosis Description L97.512 Non-pressure chronic ulcer of other part of right foot with fat layer exposed Modifier: Quantity: 1 Physician Procedures : CPT4 Code Description Modifier [id number] - WC PHYS DEBR WO ANESTH 20 SQ CM ICD-10 Diagnosis Description L97.512 Non-pressure chronic ulcer of other part of right foot with fat layer exposed Quantity: 1 Electronic Signature(s) Signed: 10/04/2020 5:22:30 PM By: Linton Ham MD Entered By: Linton Ham on 10/04/2020 09:47:58

## 2020-10-13 ENCOUNTER — Encounter (HOSPITAL_BASED_OUTPATIENT_CLINIC_OR_DEPARTMENT_OTHER): Payer: BC Managed Care – PPO | Attending: Internal Medicine | Admitting: Physician Assistant

## 2020-10-13 ENCOUNTER — Other Ambulatory Visit: Payer: Self-pay

## 2020-10-13 DIAGNOSIS — E11621 Type 2 diabetes mellitus with foot ulcer: Secondary | ICD-10-CM | POA: Insufficient documentation

## 2020-10-13 DIAGNOSIS — L97512 Non-pressure chronic ulcer of other part of right foot with fat layer exposed: Secondary | ICD-10-CM | POA: Diagnosis not present

## 2020-10-13 DIAGNOSIS — E1142 Type 2 diabetes mellitus with diabetic polyneuropathy: Secondary | ICD-10-CM | POA: Insufficient documentation

## 2020-10-13 DIAGNOSIS — I10 Essential (primary) hypertension: Secondary | ICD-10-CM | POA: Diagnosis not present

## 2020-10-13 NOTE — Progress Notes (Signed)
[name] [name], [name] [name] ([id number]) Visit Report for 10/13/2020 Arrival Information Details Patient Name: Date of Service: [name] [name], [name] [name] P. 10/13/2020 12:30 PM Medical Record Number: [id number] Patient Account Number: [id number] Date of Birth/Sex: Treating RN: 08/14/1955 (65 y.o. [name] [name], [name] [name] Primary Care [name] [name]: [name] [name] Other Clinician: Referring [name] [name]: Treating [name] [name]/Extender: [name] [name], [name] [name] in Treatment: 4 Visit Information History Since Last Visit Added or deleted any medications: No Patient Arrived: Ambulatory Any new allergies or adverse reactions: No Arrival Time: 12:37 Had a fall or experienced change in No Accompanied By: husband activities of daily living that may affect Transfer Assistance: None risk of falls: Patient Identification Verified: Yes Signs or symptoms of abuse/neglect since last visito No Secondary Verification Process Completed: Yes Hospitalized since last visit: No Patient Requires Transmission-Based Precautions: No Implantable device outside of the clinic excluding No Patient Has Alerts: No cellular tissue based products placed in the center since last visit: Has Dressing in Place as Prescribed: Yes Pain Present Now: No Electronic Signature(s) Signed: 10/13/2020 5:49:32 PM By: Deon Pilling Entered By: Deon Pilling on 10/13/2020 12:47:32 -------------------------------------------------------------------------------- Encounter Discharge Information Details Patient Name: Date of Service: [name] [name], [name] [name] P. 10/13/2020 12:30 PM Medical Record Number: [id number] Patient Account Number: [id number] Date of Birth/Sex: Treating RN: September 03, 1955 (65 y.o. [name] [name] Primary Care Bethene Hankinson: [name] [name] Other Clinician: Referring Burley Kopka: Treating Alyxandra Tenbrink/Extender: [name] [name], [name] [name] in Treatment: 4 Encounter Discharge Information Items Post Procedure Vitals Discharge Condition: Stable Temperature (F):  98.5 Ambulatory Status: Ambulatory Pulse (bpm): 65 Discharge Destination: Home Respiratory Rate (breaths/min): 18 Transportation: Private Auto Blood Pressure (mmHg): 144/85 Accompanied By: Husband Schedule Follow-up Appointment: Yes Clinical Summary of Care: Provided on 10/13/2020 Form Type Recipient Paper Patient Patient Electronic Signature(s) Signed: 10/13/2020 5:10:58 PM By: Lorrin Jackson Entered By: Lorrin Jackson on 10/13/2020 13:30:58 -------------------------------------------------------------------------------- Lower Extremity Assessment Details Patient Name: Date of Service: [name] [name] [name] P. 10/13/2020 12:30 PM Medical Record Number: [id number] Patient Account Number: [id number] Date of Birth/Sex: Treating RN: [birth year] (65 y.o. [name] [name] Primary Care Diantha Paxson: [name] [name] Other Clinician: Referring Nayah Lukens: Treating Grissel Tyrell/Extender: [name] [name], [name] [name] in Treatment: 4 Edema Assessment Assessed: [Left: No] [Right: Yes] Edema: [Left: N] [Right: o] Calf Left: Right: Point of Measurement: 28 cm From Medial Instep 38 cm Ankle Left: Right: Point of Measurement: 10 cm From Medial Instep 21.5 cm Vascular Assessment Pulses: Dorsalis Pedis Palpable: [Right:Yes] Electronic Signature(s) Signed: 10/13/2020 5:49:32 PM By: Deon Pilling Entered By: Deon Pilling on 10/13/2020 12:49:29 -------------------------------------------------------------------------------- Multi-Disciplinary Care Plan Details Patient Name: Date of Service: [name] [name], [name] [name] P. 10/13/2020 12:30 PM Medical Record Number: [id number] Patient Account Number: [id number] Date of Birth/Sex: Treating RN: [birth year] (64 y.o. [name] [name], [name] [name] Primary Care Jackelin Correia: [name] [name] Other Clinician: Referring Kalif Kattner: Treating Ilea Hilton/Extender: [name] [name], [name] [name] in Treatment: 4 Multidisciplinary Care Plan reviewed with physician Active  Inactive Nutrition Nursing Diagnoses: Impaired glucose control: actual or potential Potential for alteratiion in Nutrition/Potential for imbalanced nutrition Goals: Patient/caregiver will maintain therapeutic glucose control Date Initiated: 09/14/2020 Target Resolution Date: 11/11/2020 Goal Status: Active Interventions: Assess HgA1c results as ordered upon admission and as needed Assess patient nutrition upon admission and as needed per policy Treatment Activities: Patient referred to Primary Care Physician for further nutritional evaluation : 09/14/2020 Notes: Wound/Skin Impairment Nursing Diagnoses: Impaired tissue integrity Knowledge deficit related to ulceration/compromised skin integrity Goals: Patient/caregiver will verbalize understanding of skin care regimen Date Initiated: 09/14/2020 Target Resolution Date:  11/11/2020 Goal Status: Active Ulcer/skin breakdown will have a volume reduction of 30% by week 4 Date Initiated: 09/14/2020 Date Inactivated: 10/13/2020 Target Resolution Date: 10/12/2020 Goal Status: Met Interventions: Assess patient/caregiver ability to obtain necessary supplies Assess patient/caregiver ability to perform ulcer/skin care regimen upon admission and as needed Assess ulceration(s) every visit Provide education on ulcer and skin care Treatment Activities: Skin care regimen initiated : 09/14/2020 Topical wound management initiated : 09/14/2020 Notes: Electronic Signature(s) Signed: 10/13/2020 5:49:32 PM By: Deon Pilling Entered By: Deon Pilling on 10/13/2020 12:53:06 -------------------------------------------------------------------------------- Pain Assessment Details Patient Name: Date of Service: [name] [name] [name] P. 10/13/2020 12:30 PM Medical Record Number: [id number] Patient Account Number: [id number] Date of Birth/Sex: Treating RN: Oct 17, 1955 (65 y.o. [name] [name] Primary Care Wallace Cogliano: [name] [name] Other Clinician: Referring Lun Muro: Treating  Taylee Gunnells/Extender: [name] [name], [name] [name] in Treatment: 4 Active Problems Location of Pain Severity and Description of Pain Patient Has Paino No Site Locations Rate the pain. Current Pain Level: 0 Pain Management and Medication Current Pain Management: Medication: No Cold Application: No Rest: No Massage: No Activity: No T.E.N.S.: No Heat Application: No Leg drop or elevation: No Is the Current Pain Management Adequate: Adequate How does your wound impact your activities of daily livingo Sleep: No Bathing: No Appetite: No Relationship With Others: No Bladder Continence: No Emotions: No Bowel Continence: No Work: No Toileting: No Drive: No Dressing: No Hobbies: No Electronic Signature(s) Signed: 10/13/2020 5:49:32 PM By: Deon Pilling Entered By: Deon Pilling on 10/13/2020 12:49:15 -------------------------------------------------------------------------------- Patient/Caregiver Education Details Patient Name: Date of Service: [name] [name] [name] P. 3/3/2022andnbsp12:30 PM Medical Record Number: [id number] Patient Account Number: [id number] Date of Birth/Gender: Treating RN: 1956-04-27 (65 y.o. [name] [name] Primary Care Physician: [name] [name] Other Clinician: Referring Physician: Treating Physician/Extender: [name] [name], [name] [name] in Treatment: 4 Education Assessment Education Provided To: Patient Education Topics Provided Wound/Skin Impairment: Handouts: Skin Care Do's and Dont's Methods: Explain/Verbal Responses: Reinforcements needed Electronic Signature(s) Signed: 10/13/2020 5:49:32 PM By: Deon Pilling Entered By: Deon Pilling on 10/13/2020 12:54:00 -------------------------------------------------------------------------------- Wound Assessment Details Patient Name: Date of Service: [name] [name] [name] P. 10/13/2020 12:30 PM Medical Record Number: [id number] Patient Account Number: [id number] Date of Birth/Sex: Treating  RN: [birth year] (64 y.o. [name] [name], [name] [name] Primary Care Cadell Gabrielson: [name] [name] Other Clinician: Referring Hezikiah Retzloff: Treating Tiyona Desouza/Extender: [name] [name], [name] [name] in Treatment: 4 Wound Status Wound Number: 2 Primary Etiology: Diabetic Wound/Ulcer of the Lower Extremity Wound Location: Right T Great oe Wound Status: Open Wounding Event: Gradually Appeared Comorbid History: Anemia, Hypertension, Type II Diabetes, Neuropathy Date Acquired: 05/08/2020 [name] Of Treatment: 4 Clustered Wound: No Photos Wound Measurements Length: (cm) 0.1 Width: (cm) 0.1 Depth: (cm) 0.2 Area: (cm) 0.008 Volume: (cm) 0.002 % Reduction in Area: 94.9% % Reduction in Volume: 93.5% Epithelialization: Medium (34-66%) Tunneling: No Undermining: Yes Starting Position (o'clock): 12 Ending Position (o'clock): 12 Maximum Distance: (cm) 0.3 Wound Description Classification: Grade 2 Wound Margin: Well defined, not attached Exudate Amount: Small Exudate Type: Serosanguineous Exudate Color: red, brown Foul Odor After Cleansing: No Slough/Fibrino No Wound Bed Granulation Amount: Large (67-100%) Exposed Structure Granulation Quality: Pink Fascia Exposed: No Necrotic Amount: None Present (0%) Fat Layer (Subcutaneous Tissue) Exposed: Yes Tendon Exposed: No Muscle Exposed: No Joint Exposed: No Bone Exposed: No Assessment Notes callous periwound. Treatment Notes Wound #2 (Toe Great) Wound Laterality: Right Cleanser Wound Cleanser Discharge Instruction: Cleanse the wound with wound cleanser prior to applying a clean dressing using gauze sponges,  not tissue or cotton balls. Peri-Wound Care Topical Primary Dressing IODOFLEX 0.9% Cadexomer Iodine Pad 4x6 cm Discharge Instruction: Apply to wound bed as instructed Secondary Dressing Woven Gauze Sponge, Non-Sterile 4x4 in Discharge Instruction: Ensure to also apply rolled gauze under the toe as well. Apply over primary dressing as  directed. Secured With Conforming Stretch Gauze Bandage, Sterile 2x75 (in/in) Discharge Instruction: Secure with stretch gauze as directed. 75M Medipore H Soft Cloth Surgical T 4 x 2 (in/yd) ape Discharge Instruction: Secure dressing with tape as directed. Compression Wrap Compression Stockings Add-Ons Electronic Signature(s) Signed: 10/13/2020 5:30:41 PM By: Sandre Kitty Signed: 10/13/2020 5:49:32 PM By: Deon Pilling Entered By: Sandre Kitty on 10/13/2020 17:19:12 -------------------------------------------------------------------------------- Vitals Details Patient Name: Date of Service: [name] [name], [name] [name] P. 10/13/2020 12:30 PM Medical Record Number: [id number] Patient Account Number: [id number] Date of Birth/Sex: Treating RN: 1955/11/26 (65 y.o. [name] [name], [name] [name] Primary Care Mammie Meras: [name] [name] Other Clinician: Referring Oscar Hank: Treating Kallista Pae/Extender: [name] [name], [name] [name] in Treatment: 4 Vital Signs Time Taken: 09:38 Temperature (F): 98.5 Height (in): 62 Pulse (bpm): 65 Weight (lbs): 169 Respiratory Rate (breaths/min): 18 Body Mass Index (BMI): 30.9 Blood Pressure (mmHg): 144/82 Capillary Blood Glucose (mg/dl): 185 Reference Range: 80 - 120 mg / dl Electronic Signature(s) Signed: 10/13/2020 5:49:32 PM By: Deon Pilling Entered By: Deon Pilling on 10/13/2020 12:48:12

## 2020-10-13 NOTE — Progress Notes (Addendum)
COOPER, STAMP ([id number]) Visit Report for 10/13/2020 Chief Complaint Document Details Patient Name: Date of Service: [name] [name] P. 10/13/2020 12:30 PM Medical Record Number: [id number] Patient Account Number: [id number] Date of Birth/Sex: Treating RN: Jan 01, 1956 (65 y.o. [name] [name] Primary Care Provider: Elvia Collum Other Clinician: Referring Provider: Treating Provider/Extender: Marjory Lies, Malena Weeks in Treatment: 4 Information Obtained from: Patient Chief Complaint Right 1st toe ulcer Electronic Signature(s) Signed: 10/13/2020 1:00:27 PM By: Worthy Keeler PA-C Entered By: Worthy Keeler on 10/13/2020 13:00:26 -------------------------------------------------------------------------------- Debridement Details Patient Name: Date of Service: [facility], [name] [name] P. 10/13/2020 12:30 PM Medical Record Number: [id number] Patient Account Number: [id number] Date of Birth/Sex: Treating RN: 12-Apr-1956 (65 y.o. [name] [name], Tammi Klippel Primary Care Provider: Elvia Collum Other Clinician: Referring Provider: Treating Provider/Extender: Marjory Lies, Malena Weeks in Treatment: 4 Debridement Performed for Assessment: Wound #2 Right T Great oe Performed By: Physician Worthy Keeler, PA Debridement Type: Debridement Severity of Tissue Pre Debridement: Limited to breakdown of skin Level of Consciousness (Pre-procedure): Awake and Alert Pre-procedure Verification/Time Out Yes - 13:08 Taken: Start Time: 13:09 Pain Control: Lidocaine 4% T opical Solution T Area Debrided (L x W): otal 0.5 (cm) x 0.5 (cm) = 0.25 (cm) Tissue and other material debrided: Viable, Non-Viable, Callus, Subcutaneous, Skin: Dermis , Skin: Epidermis, Fibrin/Exudate Level: Skin/Subcutaneous Tissue Debridement Description: Excisional Instrument: Curette Bleeding: Minimum Hemostasis Achieved: Pressure End Time: 13:11 Procedural Pain: 0 Post Procedural Pain: 0 Response to Treatment:  Procedure was tolerated well Level of Consciousness (Post- Awake and Alert procedure): Post Debridement Measurements of Total Wound Length: (cm) 0.3 Width: (cm) 0.3 Depth: (cm) 0.1 Volume: (cm) 0.007 Character of Wound/Ulcer Post Debridement: Improved Severity of Tissue Post Debridement: Fat layer exposed Post Procedure Diagnosis Same as Pre-procedure Electronic Signature(s) Signed: 10/13/2020 1:27:44 PM By: Worthy Keeler PA-C Signed: 10/13/2020 5:49:32 PM By: Deon Pilling Previous Signature: 10/13/2020 1:27:33 PM Version By: Worthy Keeler PA-C Entered By: Worthy Keeler on 10/13/2020 13:27:43 -------------------------------------------------------------------------------- HPI Details Patient Name: Date of Service: [name] [name], [name] [name] P. 10/13/2020 12:30 PM Medical Record Number: [id number] Patient Account Number: [id number] Date of Birth/Sex: Treating RN: 1956/07/17 (65 y.o. [name] [name], Tammi Klippel Primary Care Provider: Elvia Collum Other Clinician: Referring Provider: Treating Provider/Extender: Marjory Lies, Malena Weeks in Treatment: 4 History of Present Illness HPI Description: 65 year old female referred to [place] by her PCP Dr. Delrae Rend, with a past medical history of type 2 diabetes mellitus with diabetic polyneuropathy, diabetic retinopathy, hyperglycemia, fracture of right great toe, diabetic foot ulcer. she has never been a smoker. The patient has had a lower extremity arterial study done in April of this year and the right ABI was 1.27 and the left ABI was 1.14. The right toe brachial index was 0.60 and the left was 0.59. The impression was no evidence of hemodynamically significant lower extremity arterial occlusive disease through the ankles bilaterally. Decreased pulse volume recordings of the left great toe suggests distal small vessel disease. note is made of the fact that the patient was seen in early April of this year in the ED with what was thought to be a  cellulitis and was put on doxycycline and was asked to follow-up with her PCP. x-ray done at that stage revealed -- IMPRESSION:There is a corner fracture at medial base of distal phalanx great toe. Clinical correlation is necessary to exclude recurrent fracture or nonunion of previous fracture. There is diffuse soft tissue swelling great  toe. The patient said she had some back surgery in 2015 which led to what sounds like a foot drop and she has been having problems raising her foot and walking well and the right big toe was injured and fractured a while ago. She also mentioned she does not take very good care of her diabetes diet. 01/24/2016 -- right foot x-ray -- IMPRESSION:There is healing corner fracture at the medial base of distal phalanx great toe with partial union. There is soft tissue swelling right great toe. On oblique view there is subtle cortical irregularity distal medial aspect of proximal phalanx great toe. Osteomyelitis cannot be excluded. Clinical correlation is necessary.Further correlation with MRI is recommended as clinically warranted. In view of the above findings we will order a MRI, which has not been done yet and see her back next week with this result. 01/31/2016 - MRI of the right foot -- IMPRESSION:1. Diffuse edema and enhancement of the entire distal phalanx of the great toe within the adjacent soft tissue ulceration and soft tissue devitalization. I suspect this represents early diffuse osteomyelitis. 2. No evidence of osteomyelitis of the proximal phalanx of the great toe. 3. Probable cellulitis along the lateral aspect of the first metatarsal. 02/07/2016 -- she is still awaiting insurance clearance to start her hyperbaric oxygen therapy and the appointment to see Dr. Mayo Ao. 03/24/2016 -- the patient has been continuing with hyperbaric oxygen therapy and has finished 17 treatments. She has a infectious disease consult with Dr. Linus Salmons tomorrow She was seen by  Dr. Mayo Ao on 03/09/2016 and his plan was to start the patient on clindamycin and treat her for osteomyelitis. So far the patient has been on oral antibiotics The pathology was negative for osteomyelitis however the bone culture was positive. We will get this report from Dr. Pasty Arch office. Addendum : we have received the reports from Dr. Pasty Arch office and the pathology showed benign bone with new bone formation and no evidence of significant inflammation. Culture report was a Beta Hemolytic Streptococcus which was sensitive penicillins and other beta lactams and rare Staphylococcus aureus, MSSA, which was sensitive to tetracycline and clindamycin and Bactrim ciprofloxacin and oxacillin. 03/28/2016 -- was seen by Dr. Scharlene Gloss who reviewed her history and physical and x-rays and for the culture positive bone biopsy with negative pathology for osteomyelitis he did not recommend long-term IV antibiotics. T treat her orally for 21 days and because it was pansensitive recommended o Keflex 500 mg 4 times a day for 21 days. He would see her back in 4 weeks to recheck an ESR and CRP and if worse would consider IV antibiotics at that time. 04/04/2016 -- she dropped a sharp object on her left dorsum of the foot a couple of days ago but there was a superficial abrasion and no deep open wound. 04/18/2016 -- she was seen by Dr. Berton Bon of infectious disease who plan to stop antibiotics and observe and check her CRP and ESR and she would follow when necessary for concerns. the sedimentation rate was 4 mm per hour, and the CRP was less than 0.5 mg per DL she was also seen by Dr. Mayo Ao who did an x-ray of the right foot but his notes and report is not available yet. 04/25/2016 -- I have reviewed the notes from Dr. Mayo Ao who saw her on 04/13/2016. no x-rays were taken in follow-up. The patient was asked to continue with care at the wound center. 05/09/2016 -- she was reviewed by  Dr.  Mayo Ao on Monday and he has discussed getting her diabetic shoes made. Other than that no changes were recommended. 05/15/16 patient arrives today with the wound on the tip of her great toe measuring at 0.6 cm. This probes down to bony surface. From what I understand this is a deterioration from last visit. She is using Prisma and changing this daily at home she has an offloading boot. 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 She cannot wear an offloading boot 05/24/16; using Prisma. We are making attempts to more aggressively offload the right great toe which is flexed at the interphalangeal joint by supporting this and keeping it forward in her footwear. 06/13/2016 -- the patient has had no discharge tenderness or cellulitis of the right great toe. Was seen by Dr. Mayo Ao on 06/08/2016 and an x-ray was done by him which showed there was some cortical changes at the distal aspect of the hallux but there was no extension or worsening of osteomyelitis and there was no soft tissue emphysema. He had made arrangements for her diabetic shoes but was awaiting insurance clearance. Readmission: 09/14/2020 upon evaluation today patient appears to be doing somewhat poorly in regard to her right great toe ulcer. She tells me currently that she has been having issues with this since September 2021. She has been seen by Dr. Earleen Newport. With that being said I did review the notes. It does appear that the patient has some issue with her great toe some contraction causes this to plantar flex which unfortunately makes the end of the toe actually rub on any [name] she wears. She has a PEG assist offloading [name] but unfortunately she is worn a hole while her big toe is which means that when she wears this  her toe does fix right down until it and then continues to wrap around. Nonetheless this is not optimal and in fact is not even going to be beneficial for her. She did have an x- ray on December 23 done by Dr. Earleen Newport there was no evidence of bony destruction to indicate osteomyelitis according to his note. I agree it does not appear to be quite that significant visually. Patient's last hemoglobin A1c stated to be 8.0 in December 2021. She has been on Augmentin as prescribed by Dr. Earleen Newport. She does have a PEG assist offloading [name] but as mentioned above is really not functioning appropriately for her at this point. Again all this started in September and really she tells me has been a frustration for her she still out of work and that is given her some concern and trouble as well. There is not appear to be any evidence of local or systemic infection. No fevers, chills, nausea, vomiting, or diarrhea. The patient does have hypertension. 2/8; patient admitted to the clinic last week. She is a type II diabetic with peripheral neuropathy. She does not appear to have any vascular issues based on a normal ABI and clinical exam. She has been dealing with a wound on the tip of her right great toe for as long as 5 months. Followed with Dr. Earleen Newport at [facility]. She was using silver collagen although her supplies were delayed and arriving. She arrives today with a probing  tunnel going medially subcutaneously from the original wound. She is offloading this in a surgical [name]. She works at [employer] but is off work on short-term disability. [Cannot work with an open toed [name]]. 2/17 in general things look better. She is using silver collagen gauze and a light Coban over the toe. Her x-ray from last week was negative for osteomyelitis culture only grew Corynebacterium likely a skin contaminant 2/22; using silver collagen. Culture and x-ray were negative last week. The tunneling area laterally has closed over  [name] epithelialization. Still debris over the surface of the original small wound this cleans up quite nicely with debridement there is undermining and perhaps 2 to 3 mm of depth 10/13/2020 upon evaluation today patient appears to be doing well with regard to her toe although unfortunately she still has a lot of callus buildup here currently. Fortunately there is no evidence of active infection which is good news. I still think the main issue is the friction due to the way her toe plantar flexes down this is just rubbing more than it should. The wound itself does not look to be too bad as far as the opening but I do not think it is close. Electronic Signature(s) Signed: 10/13/2020 1:26:11 PM By: Worthy Keeler PA-C Entered By: Worthy Keeler on 10/13/2020 13:26:11 -------------------------------------------------------------------------------- Physical Exam Details Patient Name: Date of Service: [place], [name] [name] P. 10/13/2020 12:30 PM Medical Record Number: [id number] Patient Account Number: [id number] Date of Birth/Sex: Treating RN: Apr 11, 1956 (65 y.o. [name] [name] Primary Care Provider: Elvia Collum Other Clinician: Referring Provider: Treating Provider/Extender: Marjory Lies, Malena Weeks in Treatment: 4 Constitutional Well-nourished and well-hydrated in no acute distress. Respiratory normal breathing without difficulty. Psychiatric this patient is able to make decisions and demonstrates good insight into disease process. Alert and Oriented x 3. pleasant and cooperative. Notes Upon inspection patient's wound bed actually showed signs of good granulation at this time. There does not appear to be evidence of active infection which is great news overall I did have to remove quite a bit of callus as well as subsubcutaneous tissue and she tolerated all this without complication minimal bleeding noted post debridement wound bed appears to be doing much better. Electronic  Signature(s) Signed: 10/13/2020 1:26:28 PM By: Worthy Keeler PA-C Entered By: Worthy Keeler on 10/13/2020 13:26:27 -------------------------------------------------------------------------------- Physician Orders Details Patient Name: Date of Service: [facility], [name] [name] P. 10/13/2020 12:30 PM Medical Record Number: [id number] Patient Account Number: [id number] Date of Birth/Sex: Treating RN: 04-18-1956 (65 y.o. [name] [name] Primary Care Provider: Elvia Collum Other Clinician: Referring Provider: Treating Provider/Extender: Marjory Lies, Malena Weeks in Treatment: 4 Verbal / Phone Orders: No Diagnosis Coding ICD-10 Coding Code Description E11.621 Type 2 diabetes mellitus with foot ulcer L97.512 Non-pressure chronic ulcer of other part of right foot with fat layer exposed E11.42 Type 2 diabetes mellitus with diabetic polyneuropathy I10 Essential (primary) hypertension Follow-up Appointments Return Appointment in 1 week. Bathing/ Shower/ Hygiene May shower and wash wound with soap and water. - with dressing changes Off-Loading Open toe surgical [name] to: - right foot please add double felt on surgical [name] from plantar foot just to mid toe to offload pressure from wound and toe. Wound Treatment Wound #2 - T Great oe Wound Laterality: Right Cleanser: Wound Cleanser Discharge Instructions: Cleanse the wound with wound cleanser prior to applying a clean dressing using gauze sponges, not tissue or cotton balls. Prim Dressing: IODOFLEX 0.9% Cadexomer Iodine Pad  4x6 cm ary Discharge Instructions: Apply to wound bed as instructed Secondary Dressing: Woven Gauze Sponge, Non-Sterile 4x4 in Discharge Instructions: Ensure to also apply rolled gauze under the toe as well. Apply over primary dressing as directed. Secured With: Child psychotherapist, Sterile 2x75 (in/in) Discharge Instructions: Secure with stretch gauze as directed. Secured With: 76M Medipore H Soft  Cloth Surgical T 4 x 2 (in/yd) ape Discharge Instructions: Secure dressing with tape as directed. Electronic Signature(s) Signed: 10/13/2020 5:31:07 PM By: Worthy Keeler PA-C Signed: 10/13/2020 5:49:32 PM By: Deon Pilling Entered By: Deon Pilling on 10/13/2020 13:21:46 -------------------------------------------------------------------------------- Problem List Details Patient Name: Date of Service: [name] [name], [name] [name] P. 10/13/2020 12:30 PM Medical Record Number: [id number] Patient Account Number: [id number] Date of Birth/Sex: Treating RN: [birth year] (65 y.o. [name] [name] Primary Care Provider: Elvia Collum Other Clinician: Referring Provider: Treating Provider/Extender: Marjory Lies, Malena Weeks in Treatment: 4 Active Problems ICD-10 Encounter Code Description Active Date MDM Diagnosis E11.621 Type 2 diabetes mellitus with foot ulcer 09/14/2020 No Yes L97.512 Non-pressure chronic ulcer of other part of right foot with fat layer exposed 09/14/2020 No Yes E11.42 Type 2 diabetes mellitus with diabetic polyneuropathy 09/14/2020 No Yes I10 Essential (primary) hypertension 09/14/2020 No Yes Inactive Problems Resolved Problems Electronic Signature(s) Signed: 10/13/2020 1:00:17 PM By: Worthy Keeler PA-C Entered By: Worthy Keeler on 10/13/2020 13:00:17 -------------------------------------------------------------------------------- Progress Note Details Patient Name: Date of Service: [facility], [name] [name] P. 10/13/2020 12:30 PM Medical Record Number: [id number] Patient Account Number: [id number] Date of Birth/Sex: Treating RN: 04-18-1956 (65 y.o. [name] [name] Primary Care Provider: Elvia Collum Other Clinician: Referring Provider: Treating Provider/Extender: Marjory Lies, Malena Weeks in Treatment: 4 Subjective Chief Complaint Information obtained from Patient Right 1st toe ulcer History of Present Illness (HPI) 65 year old female referred to [place] by her PCP Dr.  Delrae Rend, with a past medical history of type 2 diabetes mellitus with diabetic polyneuropathy, diabetic retinopathy, hyperglycemia, fracture of right great toe, diabetic foot ulcer. she has never been a smoker. The patient has had a lower extremity arterial study done in April of this year and the right ABI was 1.27 and the left ABI was 1.14. The right toe brachial index was 0.60 and the left was 0.59. The impression was no evidence of hemodynamically significant lower extremity arterial occlusive disease through the ankles bilaterally. Decreased pulse volume recordings of the left great toe suggests distal small vessel disease. note is made of the fact that the patient was seen in early April of this year in the ED with what was thought to be a cellulitis and was put on doxycycline and was asked to follow-up with her PCP. x-ray done at that stage revealed -- IMPRESSION:There is a corner fracture at medial base of distal phalanx great toe. Clinical correlation is necessary to exclude recurrent fracture or nonunion of previous fracture. There is diffuse soft tissue swelling great toe. The patient said she had some back surgery in 2015 which led to what sounds like a foot drop and she has been having problems raising her foot and walking well and the right big toe was injured and fractured a while ago. She also mentioned she does not take very good care of her diabetes diet. 01/24/2016 -- right foot x-ray -- IMPRESSION:There is healing corner fracture at the medial base of distal phalanx great toe with partial union. There is soft tissue swelling right great toe. On oblique view there is subtle cortical irregularity  distal medial aspect of proximal phalanx great toe. Osteomyelitis cannot be excluded. Clinical correlation is necessary.Further correlation with MRI is recommended as clinically warranted. In view of the above findings we will order a MRI, which has not been done yet and see her back  next week with this result. 01/31/2016 - MRI of the right foot -- IMPRESSION:1. Diffuse edema and enhancement of the entire distal phalanx of the great toe within the adjacent soft tissue ulceration and soft tissue devitalization. I suspect this represents early diffuse osteomyelitis. 2. No evidence of osteomyelitis of the proximal phalanx of the great toe. 3. Probable cellulitis along the lateral aspect of the first metatarsal. 02/07/2016 -- she is still awaiting insurance clearance to start her hyperbaric oxygen therapy and the appointment to see Dr. Mayo Ao. 03/24/2016 -- the patient has been continuing with hyperbaric oxygen therapy and has finished 17 treatments. She has a infectious disease consult with Dr. Linus Salmons tomorrow She was seen by Dr. Mayo Ao on 03/09/2016 and his plan was to start the patient on clindamycin and treat her for osteomyelitis. So far the patient has been on oral antibiotics The pathology was negative for osteomyelitis however the bone culture was positive. We will get this report from Dr. Pasty Arch office. Addendum : we have received the reports from Dr. Pasty Arch office and the pathology showed benign bone with new bone formation and no evidence of significant inflammation. Culture report was a Beta Hemolytic Streptococcus which was sensitive penicillins and other beta lactams and rare Staphylococcus aureus, MSSA, which was sensitive to tetracycline and clindamycin and Bactrim ciprofloxacin and oxacillin. 03/28/2016 -- was seen by Dr. Scharlene Gloss who reviewed her history and physical and x-rays and for the culture positive bone biopsy with negative pathology for osteomyelitis he did not recommend long-term IV antibiotics. T treat her orally for 21 days and because it was pansensitive recommended o Keflex 500 mg 4 times a day for 21 days. He would see her back in 4 weeks to recheck an ESR and CRP and if worse would consider IV antibiotics at  that time. 04/04/2016 -- she dropped a sharp object on her left dorsum of the foot a couple of days ago but there was a superficial abrasion and no deep open wound. 04/18/2016 -- she was seen by Dr. Berton Bon of infectious disease who plan to stop antibiotics and observe and check her CRP and ESR and she would follow when necessary for concerns. the sedimentation rate was 4 mm per hour, and the CRP was less than 0.5 mg per DL she was also seen by Dr. Mayo Ao who did an x-ray of the right foot but his notes and report is not available yet. 04/25/2016 -- I have reviewed the notes from Dr. Mayo Ao who saw her on 04/13/2016. no x-rays were taken in follow-up. The patient was asked to continue with care at the wound center. 05/09/2016 -- she was reviewed by Dr. Mayo Ao on Monday and he has discussed getting her diabetic shoes made. Other than that no changes were recommended. 05/15/16 patient arrives today with the wound on the tip of her great toe measuring at 0.6 cm. This probes down to bony surface. From what I understand this is a deterioration from last visit. She is using Prisma and changing this daily at home she has an offloading boot. 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 She cannot wear an offloading boot 05/24/16; using Prisma. We are making attempts to more aggressively offload the right great toe which is flexed at the interphalangeal joint by supporting this and keeping it forward in her footwear. 06/13/2016 -- the patient has had no discharge tenderness or cellulitis of the right great toe. Was seen by Dr. Mayo Ao on 06/08/2016 and an x-ray was done by him which showed there was some cortical changes at the distal aspect of the hallux but there was no  extension or worsening of osteomyelitis and there was no soft tissue emphysema. He had made arrangements for her diabetic shoes but was awaiting insurance clearance. Readmission: 09/14/2020 upon evaluation today patient appears to be doing somewhat poorly in regard to her right great toe ulcer. She tells me currently that she has been having issues with this since September 2021. She has been seen by Dr. Earleen Newport. With that being said I did review the notes. It does appear that the patient has some issue with her great toe some contraction causes this to plantar flex which unfortunately makes the end of the toe actually rub on any [name] she wears. She has a PEG assist offloading [name] but unfortunately she is worn a hole while her big toe is which means that when she wears this her toe does fix right down until it and then continues to wrap around. Nonetheless this is not optimal and in fact is not even going to be beneficial for her. She did have an x- ray on December 23 done by Dr. Earleen Newport there was no evidence of bony destruction to indicate osteomyelitis according to his note. I agree it does not appear to be quite that significant visually. Patient's last hemoglobin A1c stated to be 8.0 in December 2021. She has been on Augmentin as prescribed by Dr. Earleen Newport. She does have a PEG assist offloading [name] but as mentioned above is really not functioning appropriately for her at this point. Again all this started in September and really she tells me has been a frustration for her she still out of work and that is given her some concern and trouble as well. There is not appear to be any evidence of local or systemic infection. No fevers, chills, nausea, vomiting, or diarrhea. The patient does have hypertension. 2/8; patient admitted to the clinic last week. She is a type II diabetic with peripheral neuropathy. She does not appear to have any vascular issues based on a normal ABI and clinical exam. She has been  dealing with a wound on the tip of her right great toe for as long as 5 months. Followed with Dr. Earleen Newport at [facility]. She was using silver collagen although her supplies were delayed and arriving. She arrives today with a probing tunnel going medially subcutaneously from the original wound. She is offloading this in a surgical [name]. She works at [employer] but is off work on short-term disability. [Cannot work with an open toed [name]]. 2/17 in general things look better. She is using silver collagen gauze and a light Coban over the toe. Her x-ray from last week was negative for osteomyelitis culture only grew Corynebacterium likely a skin contaminant 2/22; using silver collagen. Culture and x-ray were negative last week. The tunneling area laterally has closed over [name] epithelialization. Still debris over the surface of  the original small wound this cleans up quite nicely with debridement there is undermining and perhaps 2 to 3 mm of depth 10/13/2020 upon evaluation today patient appears to be doing well with regard to her toe although unfortunately she still has a lot of callus buildup here currently. Fortunately there is no evidence of active infection which is good news. I still think the main issue is the friction due to the way her toe plantar flexes down this is just rubbing more than it should. The wound itself does not look to be too bad as far as the opening but I do not think it is close. Objective Constitutional Well-nourished and well-hydrated in no acute distress. Vitals Time Taken: 9:38 AM, Height: 62 in, Weight: 169 lbs, BMI: 30.9, Temperature: 98.5 F, Pulse: 65 bpm, Respiratory Rate: 18 breaths/min, Blood Pressure: 144/82 mmHg, Capillary Blood Glucose: 185 mg/dl. Respiratory normal breathing without difficulty. Psychiatric this patient is able to make decisions and demonstrates good insight into disease process. Alert and Oriented x 3. pleasant and cooperative. General  Notes: Upon inspection patient's wound bed actually showed signs of good granulation at this time. There does not appear to be evidence of active infection which is great news overall I did have to remove quite a bit of callus as well as subsubcutaneous tissue and she tolerated all this without complication minimal bleeding noted post debridement wound bed appears to be doing much better. Integumentary (Hair, Skin) Wound #2 status is Open. Original cause of wound was Gradually Appeared. The date acquired was: 05/08/2020. The wound has been in treatment 4 weeks. The wound is located on the Right T Great. The wound measures 0.1cm length x 0.1cm width x 0.2cm depth; 0.008cm^2 area and 0.002cm^3 volume. There is Fat oe Layer (Subcutaneous Tissue) exposed. There is no tunneling noted, however, there is undermining starting at 12:00 and ending at 12:00 with a maximum distance of 0.3cm. There is a small amount of serosanguineous drainage noted. The wound margin is well defined and not attached to the wound base. There is large (67-100%) pink granulation within the wound bed. There is no necrotic tissue within the wound bed. General Notes: callous periwound. 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 There was a Excisional Skin/Subcutaneous Tissue Debridement with a total area of 0.25 sq cm performed by Worthy Keeler, PA. With the following instrument(s): Curette to remove Viable and Non-Viable tissue/material. Material removed includes Callus, Subcutaneous Tissue, Skin: Dermis, Skin: Epidermis, and  Fibrin/Exudate after achieving pain control using Lidocaine 4% Topical Solution. A time out was conducted at 13:08, prior to the start of the procedure. A Minimum amount of bleeding was controlled with Pressure. The procedure was tolerated well with a pain level of 0 throughout and a pain level of 0 following the procedure. Post Debridement Measurements: 0.3cm length x 0.3cm width x 0.1cm depth; 0.007cm^3 volume. Character of Wound/Ulcer Post Debridement is improved. Severity of Tissue Post Debridement is: Fat layer exposed. Post procedure Diagnosis Wound #2: Same as Pre-Procedure Plan Follow-up Appointments: Return Appointment in 1 week. Bathing/ Shower/ Hygiene: May shower and wash wound with soap and water. - with dressing  changes Off-Loading: Open toe surgical [name] to: - right foot please add double felt on surgical [name] from plantar foot just to mid toe to offload pressure from wound and toe. WOUND #2: - T Great Wound Laterality: Right oe Cleanser: Wound Cleanser Discharge Instructions: Cleanse the wound with wound cleanser prior to applying a clean dressing using gauze sponges, not tissue or cotton balls. Prim Dressing: IODOFLEX 0.9% Cadexomer Iodine Pad 4x6 cm ary Discharge Instructions: Apply to wound bed as instructed Secondary Dressing: Woven Gauze Sponge, Non-Sterile 4x4 in Discharge Instructions: Ensure to also apply rolled gauze under the toe as well. Apply over primary dressing as directed. Secured With: Child psychotherapist, Sterile 2x75 (in/in) Discharge Instructions: Secure with stretch gauze as directed. Secured With: 42M Medipore H Soft Cloth Surgical T 4 x 2 (in/yd) ape Discharge Instructions: Secure dressing with tape as directed. 1. Would recommend currently that we going to continue with the wound care measures as before and the patient is in agreement the plan this includes the use of the Iodoflex/Iodosorb I think that is a good option. 2. We are going  to recommend that we try to do a little bit more as far as keeping pressure and friction off of the end of the toe. We can make some buildup in the [name] with felt to try to get pressure off of this more effectively. 3. I am also can recommend that the patient continue to avoid walking as much as she can. I Georgina Peer keep her out of work for the next month. We will see patient back for reevaluation in 1 week here in the clinic. If anything worsens or changes patient will contact our office for additional recommendations. Electronic Signature(s) Signed: 10/13/2020 1:27:57 PM By: Worthy Keeler PA-C Previous Signature: 10/13/2020 1:27:14 PM Version By: Worthy Keeler PA-C Entered By: Worthy Keeler on 10/13/2020 13:27:57 -------------------------------------------------------------------------------- SuperBill Details Patient Name: Date of Service: [facility], [name] [name] P. 10/13/2020 Medical Record Number: [id number] Patient Account Number: [id number] Date of Birth/Sex: Treating RN: 1956-01-23 (65 y.o. [name] [name], Meta.Reding Primary Care Provider: Elvia Collum Other Clinician: Referring Provider: Treating Provider/Extender: Marjory Lies, Malena Weeks in Treatment: 4 Diagnosis Coding ICD-10 Codes Code Description E11.621 Type 2 diabetes mellitus with foot ulcer L97.512 Non-pressure chronic ulcer of other part of right foot with fat layer exposed E11.42 Type 2 diabetes mellitus with diabetic polyneuropathy I10 Essential (primary) hypertension Facility Procedures CPT4 Code: [id number] Description: 62831 - DEB SUBQ TISSUE 20 SQ CM/< ICD-10 Diagnosis Description L97.512 Non-pressure chronic ulcer of other part of right foot with fat layer exposed Modifier: Quantity: 1 Physician Procedures : CPT4 Code Description Modifier [id number] 73710 - WC PHYS SUBQ TISS 20 SQ CM ICD-10 Diagnosis Description L97.512 Non-pressure chronic ulcer of other part of right foot with fat layer exposed Quantity: 1 Electronic  Signature(s) Signed: 10/13/2020 1:28:08 PM By: Worthy Keeler PA-C Entered By: Worthy Keeler on 10/13/2020 13:28:07

## 2020-10-20 ENCOUNTER — Encounter (HOSPITAL_BASED_OUTPATIENT_CLINIC_OR_DEPARTMENT_OTHER): Payer: BC Managed Care – PPO | Admitting: Physician Assistant

## 2020-10-25 ENCOUNTER — Encounter (HOSPITAL_BASED_OUTPATIENT_CLINIC_OR_DEPARTMENT_OTHER): Payer: BC Managed Care – PPO | Admitting: Internal Medicine

## 2020-10-25 ENCOUNTER — Other Ambulatory Visit: Payer: Self-pay

## 2020-10-25 DIAGNOSIS — E1142 Type 2 diabetes mellitus with diabetic polyneuropathy: Secondary | ICD-10-CM | POA: Diagnosis not present

## 2020-10-25 DIAGNOSIS — I10 Essential (primary) hypertension: Secondary | ICD-10-CM | POA: Diagnosis not present

## 2020-10-25 DIAGNOSIS — E11621 Type 2 diabetes mellitus with foot ulcer: Secondary | ICD-10-CM | POA: Diagnosis not present

## 2020-10-25 DIAGNOSIS — L97512 Non-pressure chronic ulcer of other part of right foot with fat layer exposed: Secondary | ICD-10-CM | POA: Diagnosis not present

## 2020-10-26 NOTE — Progress Notes (Signed)
SHONTAVIA, MICKEL ([id number]) Visit Report for 10/25/2020 Arrival Information Details Patient Name: Date of Service: [place], [place] [name] 10/25/2020 10:45 A M Medical Record Number: [id number] Patient Account Number: [id number] Date of Birth/Sex: Treating RN: 1955-09-18 (65 y.o. [name], [name] Primary Care Bonney Berres: [name] Other Clinician: Referring Shyna Duignan: Treating Callia Swim/Extender: Kristopher Glee, Malena Weeks in Treatment: 5 Visit Information History Since Last Visit Added or deleted any medications: No Patient Arrived: Ambulatory Any new allergies or adverse reactions: No Arrival Time: 11:07 Had a fall or experienced change in No Accompanied By: husband activities of daily living that may affect Transfer Assistance: None risk of falls: Patient Identification Verified: Yes Signs or symptoms of abuse/neglect since last visito No Secondary Verification Process Completed: Yes Hospitalized since last visit: No Patient Requires Transmission-Based Precautions: No Implantable device outside of the clinic excluding No Patient Has Alerts: No cellular tissue based products placed in the center since last visit: Has Dressing in Place as Prescribed: Yes Pain Present Now: No Electronic Signature(s) Signed: 10/25/2020 3:44:20 PM By: Karl Ito Entered By: Karl Ito on 10/25/2020 11:07:23 -------------------------------------------------------------------------------- Clinic Level of Care Assessment Details Patient Name: Date of Service: [place] [name] 10/25/2020 10:45 A M Medical Record Number: [id number] Patient Account Number: [id number] Date of Birth/Sex: Treating RN: Jan 10, 1956 (65 y.o. [name], [name] Primary Care Candise Crabtree: [name] Other Clinician: Referring Iyannah Blake: Treating Terel Bann/Extender: Kristopher Glee, Malena Weeks in Treatment: 5 Clinic Level of Care Assessment Items TOOL 4 Quantity Score X- 1 0 Use when only an  EandM is performed on FOLLOW-UP visit ASSESSMENTS - Nursing Assessment / Reassessment X- 1 10 Reassessment of Co-morbidities (includes updates in patient status) X- 1 5 Reassessment of Adherence to Treatment Plan ASSESSMENTS - Wound and Skin A ssessment / Reassessment X - Simple Wound Assessment / Reassessment - one wound 1 5 []  - 0 Complex Wound Assessment / Reassessment - multiple wounds X- 1 10 Dermatologic / Skin Assessment (not related to wound area) ASSESSMENTS - Focused Assessment X- 1 5 Circumferential Edema Measurements - multi extremities X- 1 10 Nutritional Assessment / Counseling / Intervention []  - 0 Lower Extremity Assessment (monofilament, tuning fork, pulses) []  - 0 Peripheral Arterial Disease Assessment (using hand held doppler) ASSESSMENTS - Ostomy and/or Continence Assessment and Care []  - 0 Incontinence Assessment and Management []  - 0 Ostomy Care Assessment and Management (repouching, etc.) PROCESS - Coordination of Care X - Simple Patient / Family Education for ongoing care 1 15 []  - 0 Complex (extensive) Patient / Family Education for ongoing care X- 1 10 Staff obtains , Records, T Results / Process Orders est []  - 0 Staff telephones HHA, Nursing Homes / Clarify orders / etc []  - 0 Routine Transfer to another Facility (non-emergent condition) []  - 0 Routine Hospital Admission (non-emergent condition) []  - 0 New Admissions / / Ordering NPWT Apligraf, etc. , []  - 0 Emergency Hospital Admission (emergent condition) X- 1 10 Simple Discharge Coordination []  - 0 Complex (extensive) Discharge Coordination PROCESS - Special Needs []  - 0 Pediatric / Minor Patient Management []  - 0 Isolation Patient Management []  - 0 Hearing / Language / Visual special needs []  - 0 Assessment of Community assistance (transportation, D/C planning, etc.) []  - 0 Additional assistance / Altered mentation []  - 0 Support Surface(s)  Assessment (bed, cushion, seat, etc.) INTERVENTIONS - Wound Cleansing / Measurement X - Simple Wound Cleansing - one wound 1 5 []  - 0 Complex Wound Cleansing -  multiple wounds X- 1 5 Wound Imaging (photographs - any number of wounds) []  - 0 Wound Tracing (instead of photographs) X- 1 5 Simple Wound Measurement - one wound []  - 0 Complex Wound Measurement - multiple wounds INTERVENTIONS - Wound Dressings X - Small Wound Dressing one or multiple wounds 1 10 []  - 0 Medium Wound Dressing one or multiple wounds []  - 0 Large Wound Dressing one or multiple wounds []  - 0 Application of Medications - topical []  - 0 Application of Medications - injection INTERVENTIONS - Miscellaneous []  - 0 External ear exam []  - 0 Specimen Collection (cultures, biopsies, blood, body fluids, etc.) []  - 0 Specimen(s) / Culture(s) sent or taken to Lab for analysis []  - 0 Patient Transfer (multiple staff / Nurse, adultHoyer Lift / Similar devices) []  - 0 Simple Staple / Suture removal (25 or less) []  - 0 Complex Staple / Suture removal (26 or more) []  - 0 Hypo / Hyperglycemic Management (close monitor of Blood Glucose) []  - 0 Ankle / Brachial Index (ABI) - do not check if billed separately X- 1 5 Vital Signs Has the patient been seen at the hospital within the last three years: Yes Total Score: 110 Level Of Care: New/Established - Level 3 Electronic Signature(s) Signed: 10/26/2020 5:17:38 PM By: Fonnie MuBreedlove, [name] RN Entered By: Fonnie MuBreedlove, [name] on 10/25/2020 12:03:47 -------------------------------------------------------------------------------- Encounter Discharge Information Details Patient Name: Date of Service: [name] 10/25/2020 10:45 A M Medical Record Number: [id number] Patient Account Number: [id number] Date of Birth/Sex: Treating RN: 12/10/1955 (65 y.o. Ardis RowanF) Breedlove, [name] Primary Care Kingsley Farace: Laroy Appleaylor, Malena Other Clinician: Referring Anayelli Lai: Treating Kimari Lienhard/Extender: Kristopher Gleeobson,  Michael Taylor, Malena Weeks in Treatment: 5 Encounter Discharge Information Items Discharge Condition: Stable Ambulatory Status: Ambulatory Discharge Destination: Home Transportation: Private Auto Accompanied By: Husband Schedule Follow-up Appointment: No Clinical Summary of Care: Provided on 10/25/2020 Form Type Recipient Paper Patient Patient Electronic Signature(s) Signed: 10/25/2020 12:39:49 PM By: Antonieta IbaBarnhart, Jodi Entered By: Antonieta IbaBarnhart, Jodi on 10/25/2020 12:39:49 -------------------------------------------------------------------------------- Lower Extremity Assessment Details [place]: [name] 10/25/2020 10:45 A M Medical Record Number: [id number] Patient Account Number: [id number] Date of Birth/Sex: Treating RN: 03/27/1956 (65 y.o. Tommye StandardF) Boehlein, Linda Primary Care Keyah Blizard: Laroy Appleaylor, Malena Other Clinician: Referring Relena Ivancic: Treating Kenneisha Cochrane/Extender: Kristopher Gleeobson, Michael Taylor, Malena Weeks in Treatment: 5 Edema Assessment Assessed: [Left: No] [Right: No] Edema: [Left: N] [Right: o] Calf Left: Right: Point of Measurement: 28 cm From Medial Instep 38 cm Ankle Left: Right: Point of Measurement: 10 cm From Medial Instep 21.5 cm Vascular Assessment Pulses: Dorsalis Pedis Palpable: [Right:Yes] Electronic Signature(s) Signed: 10/25/2020 5:54:58 PM By: Zenaida DeedBoehlein, Linda RN, BSN Entered By: Zenaida DeedBoehlein, Linda on 10/25/2020 11:18:43 -------------------------------------------------------------------------------- Multi Wound Chart Details Patient Name: Date of Service: [place], [name] 10/25/2020 10:45 A M Medical Record Number: [id number] Patient Account Number: [id number] Date of Birth/Sex: Treating RN: 07/01/1956 (65 y.o. Ardis RowanF) Breedlove, [name] Primary Care Onesimo Lingard: Laroy Appleaylor, Malena Other Clinician: Referring Zalmen Wrightsman: Treating Shelbee Apgar/Extender: Kristopher Gleeobson, Michael Taylor, Malena Weeks in Treatment: 5 Vital Signs Height(in): 62 Capillary Blood  Glucose(mg/dl): 784142 Weight(lbs): 696169 Pulse(bpm): 74 Body Mass Index(BMI): 31 Blood Pressure(mmHg): 136/71 Temperature(F): 98.6 Respiratory Rate(breaths/min): 18 Photos: [2:No Photos Right T Great oe] [N/A:N/A N/A] Wound Location: [2:Gradually Appeared] [N/A:N/A] Wounding Event: [2:Diabetic Wound/Ulcer of the Lower] [N/A:N/A] Primary Etiology: [2:Extremity Anemia, Hypertension, Type II] [N/A:N/A] Comorbid History: [2:Diabetes, Neuropathy 05/08/2020] [N/A:N/A] Date Acquired: [2:5] [N/A:N/A] Weeks of Treatment: [2:Open] [N/A:N/A] Wound Status: [2:0x0x0] [N/A:N/A] Measurements L x W x D (cm) [2:0] [  N/A:N/A] A (cm) : rea [2:0] [N/A:N/A] Volume (cm) : [2:100.00%] [N/A:N/A] % Reduction in A rea: [2:100.00%] [N/A:N/A] % Reduction in Volume: [2:Grade 2] [N/A:N/A] Classification: [2:None Present] [N/A:N/A] Exudate A mount: [2:Well defined, not attached] [N/A:N/A] Wound Margin: [2:None Present (0%)] [N/A:N/A] Granulation A mount: [2:None Present (0%)] [N/A:N/A] Necrotic A mount: [2:Fascia: No] [N/A:N/A] Exposed Structures: [2:Fat Layer (Subcutaneous Tissue): No Tendon: No Muscle: No Joint: No Bone: No Large (67-100%)] [N/A:N/A] Treatment Notes Wound #2 (Toe Great) Wound Laterality: Right Cleanser Peri-Wound Care Topical Primary Dressing Secondary Dressing Secured With Compression Wrap Compression Stockings Add-Ons Electronic Signature(s) Signed: 10/25/2020 5:48:16 PM By: Baltazar Najjar MD Signed: 10/26/2020 5:17:38 PM By: Fonnie Mu RN Entered By: Baltazar Najjar on 10/25/2020 12:52:31 -------------------------------------------------------------------------------- Multi-Disciplinary Care Plan Details Patient Name: Date of Service: [facility], [name] 10/25/2020 10:45 A M Medical Record Number: [id number] Patient Account Number: [id number] Date of Birth/Sex: Treating RN: [birth year] (65 y.o. [name], [name] Primary Care Butler Vegh: [name] Other  Clinician: Referring Zane Pellecchia: Treating Gaile Allmon/Extender: Kristopher Glee, Brooks Sailors in Treatment: 5 Multidisciplinary Care Plan reviewed with physician Active Inactive Electronic Signature(s) Signed: 10/26/2020 5:17:38 PM By: Fonnie Mu RN Entered By: Fonnie Mu on 10/25/2020 12:02:34 -------------------------------------------------------------------------------- Pain Assessment Details Patient Name: Date of Service: [name], [name] 10/25/2020 10:45 A M Medical Record Number: [id number] Patient Account Number: [id number] Date of Birth/Sex: Treating RN: 03/06/1956 (66 y.o. [name], [name] Primary Care Taliah Porche: [name] Other Clinician: Referring Leata Dominy: Treating Jorel Gravlin/Extender: Kristopher Glee, Malena Weeks in Treatment: 5 Active Problems Location of Pain Severity and Description of Pain Patient Has Paino No Site Locations Pain Management and Medication Current Pain Management: Electronic Signature(s) Signed: 10/25/2020 3:44:20 PM By: Karl Ito Signed: 10/26/2020 5:17:38 PM By: Fonnie Mu RN Entered By: Karl Ito on 10/25/2020 11:11:07 -------------------------------------------------------------------------------- Patient/Caregiver Education Details Patient Name: Date of Service: Emmie Niemann [name] 3/15/2022andnbsp10:45 A M Medical Record Number: [id number] Patient Account Number: [id number] Date of Birth/Gender: Treating RN: 04/18/1956 (65 y.o. [name], [name] Primary Care Physician: [name] Other Clinician: Referring Physician: Treating Physician/Extender: Kristopher Glee, Brooks Sailors in Treatment: 5 Education Assessment Education Provided To: Patient Education Topics Provided Wound/Skin Impairment: Methods: Explain/Verbal Responses: State content correctly Electronic Signature(s) Signed: 10/26/2020 5:17:38 PM By: Fonnie Mu RN Entered By: Fonnie Mu on  10/25/2020 12:03:02 -------------------------------------------------------------------------------- Wound Assessment Details Patient Name: Date of Service: [name], [name] 10/25/2020 10:45 A M Medical Record Number: [id number] Patient Account Number: [id number] Date of Birth/Sex: Treating RN: 1956/08/09 (65 y.o. Billy Coast, Linda Primary Care Sheyanne Munley: [name] Other Clinician: Referring Chen Saadeh: Treating Kendric Sindelar/Extender: Kristopher Glee, Malena Weeks in Treatment: 5 Wound Status Wound Number: 2 Primary Etiology: Diabetic Wound/Ulcer of the Lower Extremity Wound Location: Right T Great oe Wound Status: Open Wounding Event: Gradually Appeared Comorbid History: Anemia, Hypertension, Type II Diabetes, Neuropathy Date Acquired: 05/08/2020 Weeks Of Treatment: 5 Clustered Wound: No Wound Measurements Length: (cm) Width: (cm) Depth: (cm) Area: (cm) Volume: (cm) 0 % Reduction in Area: 100% 0 % Reduction in Volume: 100% 0 Epithelialization: Large (67-100%) 0 Tunneling: No 0 Undermining: No Wound Description Classification: Grade 2 Wound Margin: Well defined, not attached Exudate Amount: None Present Foul Odor After Cleansing: No Slough/Fibrino No Wound Bed Granulation Amount: None Present (0%) Exposed Structure Necrotic Amount: None Present (0%) Fascia Exposed: No Fat Layer (Subcutaneous Tissue) Exposed: No Tendon Exposed: No Muscle Exposed: No Joint Exposed: No Bone Exposed: No Electronic Signature(s) Signed: 10/25/2020 5:54:58 PM By: Zenaida Deed  RN, BSN Entered By: Zenaida Deed on 10/25/2020 11:19:46 -------------------------------------------------------------------------------- Vitals Details Patient Name: Date of Service: [place], [place] [name] 10/25/2020 10:45 A M Medical Record Number: [id number] Patient Account Number: [id number] Date of Birth/Sex: Treating RN: 1956-05-03 (65 y.o. [name], [name] Primary Care Coty Student: [name] Other Clinician: Referring Carlisle Enke: Treating Jenniferlynn Saad/Extender: Kristopher Glee, Malena Weeks in Treatment: 5 Vital Signs Time Taken: 11:10 Temperature (F): 98.6 Height (in): 62 Pulse (bpm): 74 Weight (lbs): 169 Respiratory Rate (breaths/min): 18 Body Mass Index (BMI): 30.9 Blood Pressure (mmHg): 136/71 Capillary Blood Glucose (mg/dl): 235 Reference Range: 80 - 120 mg / dl Electronic Signature(s) Signed: 10/25/2020 3:44:20 PM By: Karl Ito Entered By: Karl Ito on 10/25/2020 11:11:02

## 2020-10-26 NOTE — Progress Notes (Signed)
[name], [name] ([id number]) Visit Report for 10/25/2020 HPI Details Patient Name: Date of Service: [name] [name], [name] 10/25/2020 10:45 A M Medical Record Number: [id number] Patient Account Number: [id number] Date of Birth/Sex: Treating RN: [birth year] (65 y.o. [name] [name], [name] [name] Primary Care Provider: Elvia Collum Other Clinician: Referring Provider: Treating Provider/Extender: Melody Haver, Malena Weeks in Treatment: 5 History of Present Illness HPI Description: 65 year old female referred to [place] by her PCP Dr. Delrae Rend, with a past medical history of type 2 diabetes mellitus with diabetic polyneuropathy, diabetic retinopathy, hyperglycemia, fracture of right great toe, diabetic foot ulcer. she has never been a smoker. The patient has had a lower extremity arterial study done in April of this year and the right ABI was 1.27 and the left ABI was 1.14. The right toe brachial index was 0.60 and the left was 0.59. The impression was no evidence of hemodynamically significant lower extremity arterial occlusive disease through the ankles bilaterally. Decreased pulse volume recordings of the left great toe suggests distal small vessel disease. note is made of the fact that the patient was seen in early April of this year in the ED with what was thought to be a cellulitis and was put on doxycycline and was asked to follow-up with her PCP. x-ray done at that stage revealed -- IMPRESSION:There is a corner fracture at medial base of distal phalanx great toe. Clinical correlation is necessary to exclude recurrent fracture or nonunion of previous fracture. There is diffuse soft tissue swelling great toe. The patient said she had some back surgery in 2015 which led to what sounds like a foot drop and she has been having problems raising her foot and walking well and the right big toe was injured and fractured a while ago. She also mentioned she does not take very good care of her diabetes  diet. 01/24/2016 -- right foot x-ray -- IMPRESSION:There is healing corner fracture at the medial base of distal phalanx great toe with partial union. There is soft tissue swelling right great toe. On oblique view there is subtle cortical irregularity distal medial aspect of proximal phalanx great toe. Osteomyelitis cannot be excluded. Clinical correlation is necessary.Further correlation with MRI is recommended as clinically warranted. In view of the above findings we will order a MRI, which has not been done yet and see her back next week with this result. 01/31/2016 - MRI of the right foot -- IMPRESSION:1. Diffuse edema and enhancement of the entire distal phalanx of the great toe within the adjacent soft tissue ulceration and soft tissue devitalization. I suspect this represents early diffuse osteomyelitis. 2. No evidence of osteomyelitis of the proximal phalanx of the great toe. 3. Probable cellulitis along the lateral aspect of the first metatarsal. 02/07/2016 -- she is still awaiting insurance clearance to start her hyperbaric oxygen therapy and the appointment to see Dr. Mayo Ao. 03/24/2016 -- the patient has been continuing with hyperbaric oxygen therapy and has finished 17 treatments. She has a infectious disease consult with Dr. Linus Salmons tomorrow She was seen by Dr. Mayo Ao on 03/09/2016 and his plan was to start the patient on clindamycin and treat her for osteomyelitis. So far the patient has been on oral antibiotics The pathology was negative for osteomyelitis however the bone culture was positive. We will get this report from Dr. Pasty Arch office. Addendum : we have received the reports from Dr. Pasty Arch office and the pathology showed benign bone with new bone formation and no evidence of significant inflammation.  Culture report was a Beta Hemolytic Streptococcus which was sensitive penicillins and other beta lactams and rare Staphylococcus aureus, MSSA, which was sensitive  to tetracycline and clindamycin and Bactrim ciprofloxacin and oxacillin. 03/28/2016 -- was seen by Dr. Scharlene Gloss who reviewed her history and physical and x-rays and for the culture positive bone biopsy with negative pathology for osteomyelitis he did not recommend long-term IV antibiotics. T treat her orally for 21 days and because it was pansensitive recommended o Keflex 500 mg 4 times a day for 21 days. He would see her back in 4 weeks to recheck an ESR and CRP and if worse would consider IV antibiotics at that time. 04/04/2016 -- she dropped a sharp object on her left dorsum of the foot a couple of days ago but there was a superficial abrasion and no deep open wound. 04/18/2016 -- she was seen by Dr. Berton Bon of infectious disease who plan to stop antibiotics and observe and check her CRP and ESR and she would follow when necessary for concerns. the sedimentation rate was 4 mm per hour, and the CRP was less than 0.5 mg per DL she was also seen by Dr. Mayo Ao who did an x-ray of the right foot but his notes and report is not available yet. 04/25/2016 -- I have reviewed the notes from Dr. Mayo Ao who saw her on 04/13/2016. no x-rays were taken in follow-up. The patient was asked to continue with care at the wound center. 05/09/2016 -- she was reviewed by Dr. Mayo Ao on Monday and he has discussed getting her diabetic shoes made. Other than that no changes were recommended. 05/15/16 patient arrives today with the wound on the tip of her great toe measuring at 0.6 cm. This probes down to bony surface. From what I understand this is a deterioration from last visit. She is using Prisma and changing this daily at home she has an offloading boot. 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 She cannot wear an offloading boot 05/24/16; using Prisma. We are making attempts to more aggressively offload the right great toe which is flexed at the interphalangeal joint by supporting this and keeping it forward in her footwear. 06/13/2016 -- the patient has had no discharge tenderness or cellulitis of the right great toe. Was seen by Dr. Mayo Ao on 06/08/2016 and an x-ray was done by him which showed there was some cortical changes at the distal aspect of the hallux but there was no extension or worsening of osteomyelitis and there was no soft tissue emphysema. He had made arrangements for her diabetic shoes but was awaiting insurance clearance. Readmission: 09/14/2020 upon evaluation today patient appears to be doing somewhat poorly in regard to her right great toe ulcer. She tells me currently that she has been having issues with this since September 2021. She has been seen by Dr. Earleen Newport. With that being said I did review the notes. It does appear that the patient has some issue with her great toe some contraction causes this to plantar flex which unfortunately makes the end of the toe actually rub on any shoe she wears. She has a PEG assist offloading shoe but unfortunately she is worn a hole while her big toe is  which means that when she wears this her toe does fix right down until it and then continues to wrap around. Nonetheless this is not optimal and in fact is not even going to be beneficial for her. She did have an x- ray on December 23 done by Dr. Earleen Newport there was no evidence of bony destruction to indicate osteomyelitis according to his note. I agree it does not appear to be quite that significant visually. Patient's last hemoglobin A1c stated to be 8.0 in December 2021. She has been on Augmentin as prescribed by Dr. Earleen Newport. She does have a PEG assist offloading shoe but as mentioned above is really not functioning appropriately for her at this point. Again all  this started in September and really she tells me has been a frustration for her she still out of work and that is given her some concern and trouble as well. There is not appear to be any evidence of local or systemic infection. No fevers, chills, nausea, vomiting, or diarrhea. The patient does have hypertension. 2/8; patient admitted to the clinic last week. She is a type II diabetic with peripheral neuropathy. She does not appear to have any vascular issues based on a normal ABI and clinical exam. She has been dealing with a wound on the tip of her right great toe for as long as 5 months. Followed with Dr. Earleen Newport at [facility]. She was using silver collagen although her supplies were delayed and arriving. She arrives today with a probing tunnel going medially subcutaneously from the original wound. She is offloading this in a surgical shoe. She works at [employer] but is off work on short-term disability. [Cannot work with an open toed shoe]. 2/17 in general things look better. She is using silver collagen gauze and a light Coban over the toe. Her x-ray from last week was negative for osteomyelitis culture only grew Corynebacterium likely a skin contaminant 2/22; using silver collagen. Culture and x-ray were negative last week. The tunneling area laterally has closed over nice epithelialization. Still debris over the surface of the original small wound this cleans up quite nicely with debridement there is undermining and perhaps 2 to 3 mm of depth 10/13/2020 upon evaluation today patient appears to be doing well with regard to her toe although unfortunately she still has a lot of callus buildup here currently. Fortunately there is no evidence of active infection which is good news. I still think the main issue is the friction due to the way her toe plantar flexes down this is just rubbing more than it should. The wound itself does not look to be too bad as far as the opening but I do not think it  is close. 3/15; the patient's wound today is healed which is on the tip of her right great toe. This is actually closed today she has a hammer deformity at the interphalangeal joint Electronic Signature(s) Signed: 10/25/2020 5:48:16 PM By: Linton Ham MD Entered By: Linton Ham on 10/25/2020 12:54:10 -------------------------------------------------------------------------------- Physical Exam Details Patient Name: Date of Service: Candida Peeling, [name] 10/25/2020 10:45 A M Medical Record Number: [id number] Patient Account Number: [id number] Date of Birth/Sex: Treating RN: 1955/11/16 (65 y.o. [name] [name], [name] [name] Primary Care Provider: Elvia Collum Other Clinician: Referring Provider: Treating Provider/Extender: Melody Haver, Malena Weeks in Treatment: 5 Constitutional Sitting or standing Blood Pressure is within target range for patient.. . Pulse regular and within target range for patient.Marland Kitchen Respirations regular, non-labored and within target range.Marland Kitchen  Temperature is normal and within the target range for the patient.Marland Kitchen Appears in no distress. Notes Wound exam; the patient's wound is totally closed over some callus no debridement is necessary. Again noted is a hammer deformity at the IPJ on the right Electronic Signature(s) Signed: 10/25/2020 5:48:16 PM By: Linton Ham MD Entered By: Linton Ham on 10/25/2020 12:54:55 -------------------------------------------------------------------------------- Physician Orders Details Patient Name: Date of Service: Candida Peeling, [name] 10/25/2020 10:45 A M Medical Record Number: [id number] Patient Account Number: [id number] Date of Birth/Sex: Treating RN: 11-Feb-1956 (65 y.o. [name] [name], [name] [name] Primary Care Provider: Elvia Collum Other Clinician: Referring Provider: Treating Provider/Extender: Melody Haver, Malena Weeks in Treatment: 5 Verbal / Phone Orders: No Diagnosis Coding Follow-up Appointments Other: - No  need to follow up!:) Discharge From [facility] Discharge from [facility] Additional Orders / Instructions Other: - Keep your right great toe padded for protection. You may resume working as long as you are keeping your toe protected. Electronic Signature(s) Signed: 10/25/2020 5:48:16 PM By: Linton Ham MD Signed: 10/26/2020 5:17:38 PM By: Rhae Hammock RN Entered By: Rhae Hammock on 10/25/2020 12:00:06 -------------------------------------------------------------------------------- Problem List Details Patient Name: Date of Service: [facility], [name] 10/25/2020 10:45 A M Medical Record Number: [id number] Patient Account Number: [id number] Date of Birth/Sex: Treating RN: 11-18-1955 (65 y.o. [name] [name], [name] [name] Primary Care Provider: Elvia Collum Other Clinician: Referring Provider: Treating Provider/Extender: Melody Haver, Malena Weeks in Treatment: 5 Active Problems ICD-10 Encounter Code Description Active Date MDM Diagnosis E11.621 Type 2 diabetes mellitus with foot ulcer 09/14/2020 No Yes L97.512 Non-pressure chronic ulcer of other part of right foot with fat layer exposed 09/14/2020 No Yes E11.42 Type 2 diabetes mellitus with diabetic polyneuropathy 09/14/2020 No Yes I10 Essential (primary) hypertension 09/14/2020 No Yes Inactive Problems Resolved Problems Electronic Signature(s) Signed: 10/25/2020 5:48:16 PM By: Linton Ham MD Entered By: Linton Ham on 10/25/2020 12:52:24 -------------------------------------------------------------------------------- Progress Note Details Patient Name: Date of Service: Candida Peeling, [name] 10/25/2020 10:45 A M Medical Record Number: [id number] Patient Account Number: [id number] Date of Birth/Sex: Treating RN: 1956/02/04 (65 y.o. [name] [name], [name] [name] Primary Care Provider: Elvia Collum Other Clinician: Referring Provider: Treating Provider/Extender: Melody Haver, Malena Weeks in Treatment:  5 Subjective History of Present Illness (HPI) 65 year old female referred to [place] by her PCP Dr. Delrae Rend, with a past medical history of type 2 diabetes mellitus with diabetic polyneuropathy, diabetic retinopathy, hyperglycemia, fracture of right great toe, diabetic foot ulcer. she has never been a smoker. The patient has had a lower extremity arterial study done in April of this year and the right ABI was 1.27 and the left ABI was 1.14. The right toe brachial index was 0.60 and the left was 0.59. The impression was no evidence of hemodynamically significant lower extremity arterial occlusive disease through the ankles bilaterally. Decreased pulse volume recordings of the left great toe suggests distal small vessel disease. note is made of the fact that the patient was seen in early April of this year in the ED with what was thought to be a cellulitis and was put on doxycycline and was asked to follow-up with her PCP. x-ray done at that stage revealed -- IMPRESSION:There is a corner fracture at medial base of distal phalanx great toe. Clinical correlation is necessary to exclude recurrent fracture or nonunion of previous fracture. There is diffuse soft tissue swelling great toe. The patient said she had some back surgery in 2015 which led to what sounds like a  foot drop and she has been having problems raising her foot and walking well and the right big toe was injured and fractured a while ago. She also mentioned she does not take very good care of her diabetes diet. 01/24/2016 -- right foot x-ray -- IMPRESSION:There is healing corner fracture at the medial base of distal phalanx great toe with partial union. There is soft tissue swelling right great toe. On oblique view there is subtle cortical irregularity distal medial aspect of proximal phalanx great toe. Osteomyelitis cannot be excluded. Clinical correlation is necessary.Further correlation with MRI is recommended as clinically  warranted. In view of the above findings we will order a MRI, which has not been done yet and see her back next week with this result. 01/31/2016 - MRI of the right foot -- IMPRESSION:1. Diffuse edema and enhancement of the entire distal phalanx of the great toe within the adjacent soft tissue ulceration and soft tissue devitalization. I suspect this represents early diffuse osteomyelitis. 2. No evidence of osteomyelitis of the proximal phalanx of the great toe. 3. Probable cellulitis along the lateral aspect of the first metatarsal. 02/07/2016 -- she is still awaiting insurance clearance to start her hyperbaric oxygen therapy and the appointment to see Dr. Mayo Ao. 03/24/2016 -- the patient has been continuing with hyperbaric oxygen therapy and has finished 17 treatments. She has a infectious disease consult with Dr. Linus Salmons tomorrow She was seen by Dr. Mayo Ao on 03/09/2016 and his plan was to start the patient on clindamycin and treat her for osteomyelitis. So far the patient has been on oral antibiotics The pathology was negative for osteomyelitis however the bone culture was positive. We will get this report from Dr. Pasty Arch office. Addendum : we have received the reports from Dr. Pasty Arch office and the pathology showed benign bone with new bone formation and no evidence of significant inflammation. Culture report was a Beta Hemolytic Streptococcus which was sensitive penicillins and other beta lactams and rare Staphylococcus aureus, MSSA, which was sensitive to tetracycline and clindamycin and Bactrim ciprofloxacin and oxacillin. 03/28/2016 -- was seen by Dr. Scharlene Gloss who reviewed her history and physical and x-rays and for the culture positive bone biopsy with negative pathology for osteomyelitis he did not recommend long-term IV antibiotics. T treat her orally for 21 days and because it was pansensitive recommended o Keflex 500 mg 4 times a day for 21 days. He would see  her back in 4 weeks to recheck an ESR and CRP and if worse would consider IV antibiotics at that time. 04/04/2016 -- she dropped a sharp object on her left dorsum of the foot a couple of days ago but there was a superficial abrasion and no deep open wound. 04/18/2016 -- she was seen by Dr. Berton Bon of infectious disease who plan to stop antibiotics and observe and check her CRP and ESR and she would follow when necessary for concerns. the sedimentation rate was 4 mm per hour, and the CRP was less than 0.5 mg per DL she was also seen by Dr. Mayo Ao who did an x-ray of the right foot but his notes and report is not available yet. 04/25/2016 -- I have reviewed the notes from Dr. Mayo Ao who saw her on 04/13/2016. no x-rays were taken in follow-up. The patient was asked to continue with care at the wound center. 05/09/2016 -- she was reviewed by Dr. Mayo Ao on Monday and he has discussed getting her diabetic shoes made. Other than that  no changes were recommended. 05/15/16 patient arrives today with the wound on the tip of her great toe measuring at 0.6 cm. This probes down to bony surface. From what I understand this is a deterioration from last visit. She is using Prisma and changing this daily at home she has an offloading boot. 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 She cannot wear an offloading boot 05/24/16; using Prisma. We are making attempts to more aggressively offload the right great toe which is flexed at the interphalangeal joint by supporting this and keeping it forward in her footwear. 06/13/2016 -- the patient has had no discharge tenderness or cellulitis of the right great toe. Was seen by Dr. Mayo Ao on 06/08/2016 and an x-ray was done by him which showed  there was some cortical changes at the distal aspect of the hallux but there was no extension or worsening of osteomyelitis and there was no soft tissue emphysema. He had made arrangements for her diabetic shoes but was awaiting insurance clearance. Readmission: 09/14/2020 upon evaluation today patient appears to be doing somewhat poorly in regard to her right great toe ulcer. She tells me currently that she has been having issues with this since September 2021. She has been seen by Dr. Earleen Newport. With that being said I did review the notes. It does appear that the patient has some issue with her great toe some contraction causes this to plantar flex which unfortunately makes the end of the toe actually rub on any shoe she wears. She has a PEG assist offloading shoe but unfortunately she is worn a hole while her big toe is which means that when she wears this her toe does fix right down until it and then continues to wrap around. Nonetheless this is not optimal and in fact is not even going to be beneficial for her. She did have an x- ray on December 23 done by Dr. Earleen Newport there was no evidence of bony destruction to indicate osteomyelitis according to his note. I agree it does not appear to be quite that significant visually. Patient's last hemoglobin A1c stated to be 8.0 in December 2021. She has been on Augmentin as prescribed by Dr. Earleen Newport. She does have a PEG assist offloading shoe but as mentioned above is really not functioning appropriately for her at this point. Again all this started in September and really she tells me has been a frustration for her she still out of work and that is given her some concern and trouble as well. There is not appear to be any evidence of local or systemic infection. No fevers, chills, nausea, vomiting, or diarrhea. The patient does have hypertension. 2/8; patient admitted to the clinic last week. She is a type II diabetic with peripheral neuropathy. She does not  appear to have any vascular issues based on a normal ABI and clinical exam. She has been dealing with a wound on the tip of her right great toe for as long as 5 months. Followed with Dr. Earleen Newport at [facility]. She was using silver collagen although her supplies were delayed and arriving. She arrives today with a probing tunnel going medially subcutaneously from the original wound. She is offloading this in a surgical shoe. She  works at [employer] but is off work on short-term disability. [Cannot work with an open toed shoe]. 2/17 in general things look better. She is using silver collagen gauze and a light Coban over the toe. Her x-ray from last week was negative for osteomyelitis culture only grew Corynebacterium likely a skin contaminant 2/22; using silver collagen. Culture and x-ray were negative last week. The tunneling area laterally has closed over nice epithelialization. Still debris over the surface of the original small wound this cleans up quite nicely with debridement there is undermining and perhaps 2 to 3 mm of depth 10/13/2020 upon evaluation today patient appears to be doing well with regard to her toe although unfortunately she still has a lot of callus buildup here currently. Fortunately there is no evidence of active infection which is good news. I still think the main issue is the friction due to the way her toe plantar flexes down this is just rubbing more than it should. The wound itself does not look to be too bad as far as the opening but I do not think it is close. 3/15; the patient's wound today is healed which is on the tip of her right great toe. This is actually closed today she has a hammer deformity at the interphalangeal joint Objective Constitutional Sitting or standing Blood Pressure is within target range for patient.. Pulse regular and within target range for patient.Marland Kitchen Respirations regular, non-labored and within target range.. Temperature is normal and within  the target range for the patient.Marland Kitchen Appears in no distress. Vitals Time Taken: 11:10 AM, Height: 62 in, Weight: 169 lbs, BMI: 30.9, Temperature: 98.6 F, Pulse: 74 bpm, Respiratory Rate: 18 breaths/min, Blood Pressure: 136/71 mmHg, Capillary Blood Glucose: 142 mg/dl. General Notes: Wound exam; the patient's wound is totally closed over some callus no debridement is necessary. Again noted is a hammer deformity at the IPJ on the right Integumentary (Hair, Skin) Wound #2 status is Open. Original cause of wound was Gradually Appeared. The date acquired was: 05/08/2020. The wound has been in treatment 5 weeks. The wound is located on the Right T Great. The wound measures 0cm length x 0cm width x 0cm depth; 0cm^2 area and 0cm^3 volume. There is no tunneling or oe undermining noted. There is a none present amount of drainage noted. The wound margin is well defined and not attached to the wound base. There is no granulation within the wound bed. There is no necrotic tissue within the wound bed. 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 You may resume working as long as you are keeping your toe protected. 1. The patient's wound is healed 2. 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 For this reason she is actually been on disability. She is anxious to get back to work. 3. I have cautioned her to pad this area and attempt to straighten the toe at the IPJ enough to keep it off the bottom of her shoes. She may need to do  this indefinitely 4. If she comes back to clinic in short order I wonder  about referring her to podiatry and an attempt to straighten the toe perhaps with tendon rupture Electronic Signature(s) Signed: 10/25/2020 5:48:16 PM By: Linton Ham MD Entered By: Linton Ham on 10/25/2020 12:56:10 -------------------------------------------------------------------------------- SuperBill Details Patient Name: Date of Service: Candida Peeling, [name] 10/25/2020 Medical Record Number: [id number] Patient Account Number: [id number] Date of Birth/Sex: Treating RN: 1956/02/21 (65 y.o. [name] [name], [name] [name] Primary Care Provider: Elvia Collum Other Clinician: Referring Provider: Treating Provider/Extender: Melody Haver, Malena Weeks in Treatment: 5 Diagnosis Coding ICD-10 Codes Code Description E11.621 Type 2 diabetes mellitus with foot ulcer L97.512 Non-pressure chronic ulcer of other part of right foot with fat layer exposed E11.42 Type 2 diabetes mellitus with diabetic polyneuropathy I10 Essential (primary) hypertension Facility Procedures CPT4 Code: [id number] Description: 99213 - WOUND CARE VISIT-LEV 3 EST PT Modifier: Quantity: 1 Physician Procedures : CPT4 Code Description Modifier [id number] 20041 - WC PHYS LEVEL 3 - EST PT ICD-10 Diagnosis Description E11.621 Type 2 diabetes mellitus with foot ulcer L97.512 Non-pressure chronic ulcer of other part of right foot with fat layer exposed E11.42 Type 2  diabetes mellitus with diabetic polyneuropathy Quantity: 1 Electronic Signature(s) Signed: 10/25/2020 5:48:16 PM By: Linton Ham MD Entered By: Linton Ham on 10/25/2020 12:56:26

## 2020-11-10 DIAGNOSIS — E113393 Type 2 diabetes mellitus with moderate nonproliferative diabetic retinopathy without macular edema, bilateral: Secondary | ICD-10-CM | POA: Diagnosis not present

## 2020-11-10 DIAGNOSIS — H35033 Hypertensive retinopathy, bilateral: Secondary | ICD-10-CM | POA: Diagnosis not present

## 2020-11-10 DIAGNOSIS — H35432 Paving stone degeneration of retina, left eye: Secondary | ICD-10-CM | POA: Diagnosis not present

## 2020-11-10 DIAGNOSIS — H43822 Vitreomacular adhesion, left eye: Secondary | ICD-10-CM | POA: Diagnosis not present

## 2020-11-21 ENCOUNTER — Ambulatory Visit (INDEPENDENT_AMBULATORY_CARE_PROVIDER_SITE_OTHER): Payer: BC Managed Care – PPO | Admitting: Podiatry

## 2020-11-21 ENCOUNTER — Encounter: Payer: Self-pay | Admitting: Podiatry

## 2020-11-21 ENCOUNTER — Other Ambulatory Visit: Payer: Self-pay

## 2020-11-21 DIAGNOSIS — E1142 Type 2 diabetes mellitus with diabetic polyneuropathy: Secondary | ICD-10-CM

## 2020-11-21 DIAGNOSIS — M2031 Hallux varus (acquired), right foot: Secondary | ICD-10-CM

## 2020-11-21 DIAGNOSIS — L97512 Non-pressure chronic ulcer of other part of right foot with fat layer exposed: Secondary | ICD-10-CM | POA: Diagnosis not present

## 2020-11-21 DIAGNOSIS — Z794 Long term (current) use of insulin: Secondary | ICD-10-CM

## 2020-11-22 ENCOUNTER — Encounter: Payer: Self-pay | Admitting: Podiatry

## 2020-11-22 NOTE — Progress Notes (Signed)
  Subjective:  Patient ID: [name] [name], female    DOB: Aug 12, 1956,  MRN: [id number]  Chief Complaint  Patient presents with  . Diabetic Ulcer    Urgent Work In, diabetic with open wound some drainage, slight infection, [place] Patient    65 y.o. female presents with the above complaint. History confirmed with patient.  Has been seeing wound care center and had the wound healed up, she was released back to work on 10/25/2020 and would like to work in her closed toed shoes and the wound has returned  Objective:  Physical Exam: warm, good capillary refill and normal DP and PT pulses.  Ulceration right hallux measuring 0.3 x 0.3 x 0.1 cm, granular wound bed, no signs of purulence or deep infection, hyperkeratosis surrounding this     Assessment:  No diagnosis found.   Plan:  Patient was evaluated and treated and all questions answered.  Ulcer right hallux -Debridement as below. -Dressed with Iodosorb, DSD.  Continue this at home -Continue off-loading with surgical shoe. -Recommend to be out of work until this heals again because of the restrictions of her job which she cannot wear an open toed shoe such as a surgical shoe or boot. -At think this probably a recurrent issue unless the hallux malleus deformity is addressed which she has discussed with Dr. Ardelle Anton previous I think she should strongly consider the surgical correction of this at some point.  Procedure: Excisional Debridement of Wound Rationale: Removal of non-viable soft tissue from the wound to promote healing.  Anesthesia: none Pre-Debridement Wound Measurements: Unmeasurable due to overlying hyperkeratosis Post-Debridement Wound Measurements: 0.3 x 0.3 x 0.1 cm Type of Debridement: Sharp Excisional Tissue Removed: Non-viable soft tissue Depth of Debridement: subcutaneous tissue. Technique: Sharp excisional debridement to bleeding, viable wound base.  Dressing: Dry, sterile, compression dressing. Disposition: Patient  tolerated procedure well.    Return in about 2 weeks (around 12/05/2020) for wound re-check with Dr Ardelle Anton .

## 2020-11-28 DIAGNOSIS — Z794 Long term (current) use of insulin: Secondary | ICD-10-CM | POA: Diagnosis not present

## 2020-11-28 DIAGNOSIS — Z79899 Other long term (current) drug therapy: Secondary | ICD-10-CM | POA: Diagnosis not present

## 2020-11-28 DIAGNOSIS — Z7984 Long term (current) use of oral hypoglycemic drugs: Secondary | ICD-10-CM | POA: Diagnosis not present

## 2020-11-28 DIAGNOSIS — E1165 Type 2 diabetes mellitus with hyperglycemia: Secondary | ICD-10-CM | POA: Diagnosis not present

## 2020-11-28 DIAGNOSIS — Z8673 Personal history of transient ischemic attack (TIA), and cerebral infarction without residual deficits: Secondary | ICD-10-CM | POA: Diagnosis not present

## 2020-11-28 DIAGNOSIS — E1142 Type 2 diabetes mellitus with diabetic polyneuropathy: Secondary | ICD-10-CM | POA: Diagnosis not present

## 2020-12-08 ENCOUNTER — Encounter: Payer: Self-pay | Admitting: Podiatry

## 2020-12-08 ENCOUNTER — Ambulatory Visit (INDEPENDENT_AMBULATORY_CARE_PROVIDER_SITE_OTHER): Payer: BC Managed Care – PPO | Admitting: Podiatry

## 2020-12-08 ENCOUNTER — Other Ambulatory Visit: Payer: Self-pay

## 2020-12-08 DIAGNOSIS — M2031 Hallux varus (acquired), right foot: Secondary | ICD-10-CM

## 2020-12-08 DIAGNOSIS — L97512 Non-pressure chronic ulcer of other part of right foot with fat layer exposed: Secondary | ICD-10-CM | POA: Diagnosis not present

## 2020-12-08 DIAGNOSIS — Z794 Long term (current) use of insulin: Secondary | ICD-10-CM

## 2020-12-08 DIAGNOSIS — E1142 Type 2 diabetes mellitus with diabetic polyneuropathy: Secondary | ICD-10-CM

## 2020-12-12 NOTE — Progress Notes (Signed)
Subjective: 65 year old female presents the office today for reoccurrence and follow-up evaluation of the ulcer at the distal aspect of her right big toe.  She was seen in the wound care center and the wound healed however she returned to work and the wound reoccurred.  She last followed up with Dr. Lilian Kapur on November 21, 2020.  She has been continuing daily dressing changes.  She seems to doing better but still present.  Denies any swelling or redness or any drainage. Denies any systemic complaints such as fevers, chills, nausea, vomiting. No acute changes since last appointment, and no other complaints at this time.   Objective: AAO x3, NAD DP/PT pulses palpable bilaterally, CRT less than 3 seconds Sensation decreased with Semmes Weinstein monofilament. Hallux malleus is present there is hyperkeratotic lesion with central granular wound present the distal aspect left hallux.  After debridement of the wound today measures 0.2 x 0.2 x 0.1 cm.  There is no probing to bone, undermining or tunneling.  No edema, erythema, drainage or pus.  No fluctuation or crepitation.  No malodor.  No pain with calf compression, swelling, warmth, erythema  Assessment: Recurrence of ulceration, hallux malleus  Plan: -All treatment options discussed with the patient including all alternatives, risks, complications.  -Sharply debrided the wound today utilizing the 312 with scalpel to debride nonviable devitalized tissue to help promote wound healing.  Was debrided down to subcutaneous tissue.  Tolerated well with no blood loss. -Iodosorb dressing changes daily -Offloading. -Discussed with her surgical intervention to correct the hallux valgus.  Wound is healed to help with any reoccurrence. -Also discussed glucose control. -Patient encouraged to call the office with any questions, concerns, change in symptoms.

## 2020-12-22 ENCOUNTER — Other Ambulatory Visit: Payer: Self-pay

## 2020-12-22 ENCOUNTER — Ambulatory Visit (INDEPENDENT_AMBULATORY_CARE_PROVIDER_SITE_OTHER): Payer: BC Managed Care – PPO | Admitting: Podiatry

## 2020-12-22 ENCOUNTER — Encounter: Payer: Self-pay | Admitting: Podiatry

## 2020-12-22 DIAGNOSIS — E538 Deficiency of other specified B group vitamins: Secondary | ICD-10-CM | POA: Insufficient documentation

## 2020-12-22 DIAGNOSIS — E1142 Type 2 diabetes mellitus with diabetic polyneuropathy: Secondary | ICD-10-CM

## 2020-12-22 DIAGNOSIS — E11319 Type 2 diabetes mellitus with unspecified diabetic retinopathy without macular edema: Secondary | ICD-10-CM | POA: Insufficient documentation

## 2020-12-22 DIAGNOSIS — L97512 Non-pressure chronic ulcer of other part of right foot with fat layer exposed: Secondary | ICD-10-CM | POA: Diagnosis not present

## 2020-12-22 DIAGNOSIS — Z794 Long term (current) use of insulin: Secondary | ICD-10-CM | POA: Diagnosis not present

## 2020-12-22 DIAGNOSIS — Z8673 Personal history of transient ischemic attack (TIA), and cerebral infarction without residual deficits: Secondary | ICD-10-CM | POA: Insufficient documentation

## 2020-12-22 DIAGNOSIS — E559 Vitamin D deficiency, unspecified: Secondary | ICD-10-CM | POA: Insufficient documentation

## 2020-12-26 ENCOUNTER — Encounter: Payer: Self-pay | Admitting: Family Medicine

## 2020-12-26 ENCOUNTER — Ambulatory Visit (INDEPENDENT_AMBULATORY_CARE_PROVIDER_SITE_OTHER): Payer: BC Managed Care – PPO | Admitting: Family Medicine

## 2020-12-26 ENCOUNTER — Other Ambulatory Visit: Payer: Self-pay

## 2020-12-26 VITALS — BP 128/74 | HR 81 | Temp 97.3°F | Ht 62.0 in | Wt 169.0 lb

## 2020-12-26 DIAGNOSIS — E1142 Type 2 diabetes mellitus with diabetic polyneuropathy: Secondary | ICD-10-CM

## 2020-12-26 DIAGNOSIS — E11319 Type 2 diabetes mellitus with unspecified diabetic retinopathy without macular edema: Secondary | ICD-10-CM | POA: Diagnosis not present

## 2020-12-26 DIAGNOSIS — E1169 Type 2 diabetes mellitus with other specified complication: Secondary | ICD-10-CM | POA: Diagnosis not present

## 2020-12-26 DIAGNOSIS — I10 Essential (primary) hypertension: Secondary | ICD-10-CM

## 2020-12-26 DIAGNOSIS — Z8673 Personal history of transient ischemic attack (TIA), and cerebral infarction without residual deficits: Secondary | ICD-10-CM

## 2020-12-26 DIAGNOSIS — Z1211 Encounter for screening for malignant neoplasm of colon: Secondary | ICD-10-CM

## 2020-12-26 DIAGNOSIS — E785 Hyperlipidemia, unspecified: Secondary | ICD-10-CM

## 2020-12-26 DIAGNOSIS — L97512 Non-pressure chronic ulcer of other part of right foot with fat layer exposed: Secondary | ICD-10-CM | POA: Insufficient documentation

## 2020-12-26 MED ORDER — ATORVASTATIN CALCIUM 10 MG PO TABS: ORAL_TABLET | ORAL | 5 refills | Status: DC

## 2020-12-26 MED ORDER — TRIAMTERENE-HCTZ 37.5-25 MG PO CAPS: 1.0000 | ORAL_CAPSULE | Freq: Every day | ORAL | 5 refills | Status: DC

## 2020-12-26 NOTE — Progress Notes (Signed)
Patient ID: [name] [name], female    DOB: 1956/04/11, 65 y.o.   MRN: [id number]   Chief Complaint  Patient presents with  . Hyperlipidemia  . Hypertension   Subjective:    HPI  Follow-up hyperlipidemia and hypertension-med check up. Needs refill on atorvastatin and dyazide. Sees diabetic specialist.   Has a1c high, seeing diabetic endo every 3 months. Has elevated a1c 10.4. Taking metformin and lantus.  Seeing Dr. Sharl Ma, Endo.  Rt foot ulcer on rt great toe- on right has broken toe and rubbing on end of toe.  Non healing.  Seeing podiatry.-Dr. Loreta Ave. Not on antibiotics at this time. H/o osteomyelitis rt foot, since 2017.  [facility]ing Dr. [facility].  Pt stating was told her "eyes haven't changed." pt not aware that she has retinopathy.  Per chart has retinopathy assoc with diabetes.  HTN Pt compliant with BP meds.  No SEs Denies chest pain, sob, LE swelling, or blurry vision.  Has h/o heart murmur, has been told about it. Had u/s in past on heart. Had echo 2019.  Aortic valve with mild stenosis.   HLD- doing well no new concerns.  Compliant with meds. No chest pain, palpitations, myalgias or joint pains.   Medical History [place] has a past medical history of CVA (cerebral vascular accident) (HCC) (02/24/2018), Diabetes (HCC) (02/24/2018), Hypertension, Microproteinuria, Neuropathy, Noncompliance, Osteopenia, Stroke (HCC), Type 2 diabetes mellitus (HCC), and Vertigo.   Outpatient Encounter Medications as of 12/26/2020  Medication Sig  . aspirin 81 MG EC tablet 1 tablet  . Cyanocobalamin (B-12 PO) Take 2 tablets by mouth daily.   . insulin glargine (LANTUS) 100 UNIT/ML Solostar Pen Inject 52 Units into the skin at bedtime.   . Insulin Pen Needle (RELION PEN NEEDLES) 32G X 4 MM MISC USE ONE  ONCE DAILY  . metFORMIN (GLUCOPHAGE-XR) 500 MG 24 hr tablet Take by mouth.  . Multiple Vitamins-Minerals (ONE-A-DAY WOMENS VITACRAVES) CHEW Chew 1 tablet by mouth  daily.  . [DISCONTINUED] atorvastatin (LIPITOR) 10 MG tablet TAKE 1 TABLET BY MOUTH ONCE DAILY AT  6  PM  . [DISCONTINUED] triamterene-hydrochlorothiazide (DYAZIDE) 37.5-25 MG capsule Take 1 capsule by mouth once daily  . atorvastatin (LIPITOR) 10 MG tablet TAKE 1 TABLET BY MOUTH ONCE DAILY AT  6  PM  . triamterene-hydrochlorothiazide (DYAZIDE) 37.5-25 MG capsule Take 1 each (1 capsule total) by mouth daily.  . [DISCONTINUED] amoxicillin-clavulanate (AUGMENTIN) 875-125 MG tablet Take 1 tablet by mouth 2 (two) times daily.  . [DISCONTINUED] Clindamycin-Benzoyl Per, Refr, gel Apply topically at bedtime.  . [DISCONTINUED] DUREZOL 0.05 % EMUL Place 1 drop into the right eye 3 (three) times daily.  . [DISCONTINUED] ibuprofen (ADVIL) 600 MG tablet Take 600 mg by mouth.  . [DISCONTINUED] loteprednol (LOTEMAX) 0.5 % ophthalmic suspension SMARTSIG:In Eye(s)  . [DISCONTINUED] neomycin-polymyxin b-dexamethasone (MAXITROL) 3.5-10000-0.1 OINT 1 application 2 (two) times daily.  . [DISCONTINUED] PROLENSA 0.07 % SOLN SMARTSIG:1 Drop(s) Left Eye Every Evening  . [DISCONTINUED] silver sulfADIAZINE (SILVADENE) 1 % cream Apply 1 application topically daily.  . [DISCONTINUED] TRULICITY 0.75 MG/0.5ML SOPN Inject 0.75 mg into the skin once a week. (Patient not taking: Reported on 06/27/2020)   No facility-administered encounter medications on file as of 12/26/2020.     Review of Systems  Constitutional: Negative for chills and fever.  HENT: Negative for congestion, rhinorrhea and sore throat.   Respiratory: Negative for cough, shortness of breath and wheezing.   Cardiovascular: Negative for chest pain and leg swelling.  Gastrointestinal: Negative for abdominal pain, diarrhea, nausea and vomiting.  Genitourinary: Negative for dysuria and frequency.  Musculoskeletal: Negative for arthralgias and back pain.  Skin: Negative for rash.  Neurological: Negative for dizziness, weakness and headaches.     Vitals BP  128/74   Pulse 81   Temp (!) 97.3 F (36.3 C)   Ht 5\' 2"  (1.575 m)   Wt 169 lb (76.7 kg)   SpO2 97%   BMI 30.91 kg/m   Objective:   Physical Exam Vitals and nursing note reviewed.  Constitutional:      Appearance: Normal appearance.  HENT:     Head: Normocephalic and atraumatic.     Nose: Nose normal.     Mouth/Throat:     Mouth: Mucous membranes are moist.     Pharynx: Oropharynx is clear.  Eyes:     Extraocular Movements: Extraocular movements intact.     Conjunctiva/sclera: Conjunctivae normal.     Pupils: Pupils are equal, round, and reactive to light.  Cardiovascular:     Rate and Rhythm: Normal rate and regular rhythm.     Pulses: Normal pulses.     Heart sounds: Normal heart sounds.  Pulmonary:     Effort: Pulmonary effort is normal.     Breath sounds: Normal breath sounds. No wheezing, rhonchi or rales.  Musculoskeletal:        General: Normal range of motion.     Right lower leg: No edema.     Left lower leg: No edema.     Comments: Right foot- postop shoe.  Nonhealing right great toe wound.  Skin:    General: Skin is warm and dry.     Findings: No lesion or rash.  Neurological:     General: No focal deficit present.     Mental Status: She is alert and oriented to person, place, and time.  Psychiatric:        Mood and Affect: Mood normal.        Behavior: Behavior normal.    Assessment and Plan   1. Essential hypertension  2. Diabetic retinopathy associated with type 2 diabetes mellitus, macular edema presence unspecified, unspecified laterality, unspecified retinopathy severity (HCC)  3. Polyneuropathy due to type 2 diabetes mellitus (HCC)  4. Hyperlipidemia associated with type 2 diabetes mellitus (HCC)  5. History of CVA (cerebrovascular accident)  6. 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 Screen for colon cancer - Cologuard   Hypertension-stable, continue medications. Hyperlipidemia-stable, continue  medications.  Health maintenance-pt wanting to do cologuard. Pt declining colonoscopy. No family history colon cancer.  dm2- h/o retinopathy and neuropathy, with non-healing wound on rt great toe. Uncontrolled, cont to f/u with Endo, Dr. . Cont to watch carbs in diet and con f/u with endo and podiatry.  Continue to follow-up with podiatry for nonhealing ulcer on right great toe. Pt to f/u with eye exam this year.  Patient stating she will call for referral.  Advising pt to eat low carb diet and cont to keep glucose under control to avoid end organ damage and losing her toe or worse kidney/eye problems.  Pt voiced understanding.   Return in about 6 months (around 06/28/2021) for f/u htn, hld.   01/09/2021

## 2020-12-27 NOTE — Progress Notes (Signed)
Subjective: 65 year old female presents the office today for reoccurrence and follow-up evaluation of the ulcer at the distal aspect of her right big toe.  She states that the toe is doing better but the wound is still present.  She states that she has been trying to keep her blood sugar under better control and watch her diet.  She does that she has been on her feet quite a bit which may be causing the wound not heal as well.  Denies any fevers, chills, nausea, vomiting.  No calf pain, chest pain, shortness of breath.    Objective: AAO x3, NAD DP/PT pulses palpable bilaterally, CRT less than 3 seconds Sensation decreased with Semmes Weinstein monofilament. Hallux malleus is present there is hyperkeratotic lesion with central granular wound present the distal aspect left hallux.  After debridement of the wound today there is a pinpoint type lesion.  There is no probing to bone, undermining or tunneling.  There is no surrounding erythema, ascending cellulitis.  There is no fluctuation crepitation.  No malodor.  No malodor.  No pain with calf compression, swelling, warmth, erythema  Assessment: Recurrence of ulceration, hallux malleus  Plan: -All treatment options discussed with the patient including all alternatives, risks, complications.  -Sharply debrided the wound today utilizing the 312 with scalpel to debride nonviable devitalized tissue to help promote wound healing.  Was debrided down to subcutaneous tissue.  Tolerated well with no blood loss.  The wound is doing better and so pinpoint type lesion.  Continue with daily dressing changes.  Continue offloading limit activity.  Discussed with her in the future possible surgical intervention although she has had some tenderness I think it be beneficial to help her get reoccurrence of the ulceration once this is healed.  Vivi Barrack DPM

## 2021-01-05 ENCOUNTER — Ambulatory Visit (INDEPENDENT_AMBULATORY_CARE_PROVIDER_SITE_OTHER): Payer: BC Managed Care – PPO | Admitting: Podiatry

## 2021-01-05 ENCOUNTER — Other Ambulatory Visit: Payer: Self-pay

## 2021-01-05 ENCOUNTER — Encounter: Payer: Self-pay | Admitting: Podiatry

## 2021-01-05 DIAGNOSIS — E1142 Type 2 diabetes mellitus with diabetic polyneuropathy: Secondary | ICD-10-CM

## 2021-01-05 DIAGNOSIS — L97512 Non-pressure chronic ulcer of other part of right foot with fat layer exposed: Secondary | ICD-10-CM | POA: Diagnosis not present

## 2021-01-05 DIAGNOSIS — Z794 Long term (current) use of insulin: Secondary | ICD-10-CM

## 2021-01-05 DIAGNOSIS — M2031 Hallux varus (acquired), right foot: Secondary | ICD-10-CM

## 2021-01-08 NOTE — Progress Notes (Signed)
Subjective: 65 year old female presents the office today for reoccurrence and follow-up evaluation of the ulcer at the distal aspect of her right big toe.  Patient is concerned about the wound and it coming back.  Denies any drainage or pus or any swelling.  No pain. Denies any fevers, chills, nausea, vomiting.  No calf pain, chest pain, shortness of breath.    Objective: AAO x3, NAD DP/PT pulses palpable bilaterally, CRT less than 3 seconds Sensation decreased with Semmes Weinstein monofilament. Hallux malleus is present there is hyperkeratotic lesion with dried blood present however upon debridement appears of the underlying ulceration is healed.  The area is preulcerative at this time without any definitive skin breakdown.  There is no edema, erythema, drainage or pus or any obvious signs of infection. No malodor.  No pain with calf compression, swelling, warmth, erythema  Assessment: Recurrence of ulceration, hallux malleus-improved  Plan: -All treatment options discussed with the patient including all alternatives, risks, complications.  -Sharply debrided the hyperkeratotic lesion today with a #312 with scalpel to reveal the underlying ulceration is healed but is still preulcerative.  Continue with daily dressing changes, offloading. -Discussed that in the future hallux IPJ fusion will be beneficial but she is has not do surgery.  Encourage glucose control and daily foot inspection.  Vivi Barrack DPM

## 2021-01-09 ENCOUNTER — Encounter: Payer: Self-pay | Admitting: Family Medicine

## 2021-01-24 ENCOUNTER — Encounter: Payer: Self-pay | Admitting: Podiatry

## 2021-01-24 ENCOUNTER — Ambulatory Visit (INDEPENDENT_AMBULATORY_CARE_PROVIDER_SITE_OTHER): Payer: BC Managed Care – PPO | Admitting: Podiatry

## 2021-01-24 ENCOUNTER — Other Ambulatory Visit: Payer: Self-pay

## 2021-01-24 DIAGNOSIS — E1142 Type 2 diabetes mellitus with diabetic polyneuropathy: Secondary | ICD-10-CM | POA: Diagnosis not present

## 2021-01-24 DIAGNOSIS — L97512 Non-pressure chronic ulcer of other part of right foot with fat layer exposed: Secondary | ICD-10-CM | POA: Diagnosis not present

## 2021-01-24 DIAGNOSIS — Z794 Long term (current) use of insulin: Secondary | ICD-10-CM

## 2021-01-28 NOTE — Progress Notes (Signed)
Subjective: 65 year old female presents the office today for reoccurrence and follow-up evaluation of the ulcer at the distal aspect of her right big toe.  She states that the wound is doing better and appears that the wound is healed.  She has not seen any swelling or redness or any drainage or pus.  She has no other concerns today. Denies any fevers, chills, nausea, vomiting.  No calf pain, chest pain, shortness of breath.    Objective: AAO x3, NAD-presents wearing regular shoe DP/PT pulses palpable bilaterally, CRT less than 3 seconds Sensation decreased with Semmes Weinstein monofilament. Hallux malleus is present there is hyperkeratotic lesion at the distal aspect of toe.  There is no underlying ulceration drainage or signs of infection.  Appears the wound is healed. No malodor.  No pain with calf compression, swelling, warmth, erythema     Assessment: Recurrence of ulceration, hallux malleus-improved  Plan: -All treatment options discussed with the patient including all alternatives, risks, complications.  -Debrided hyperkeratotic tissue with any complications.  There is no wound is healed.  She is back to wearing regular shoes.  I want her to hold off on returning back to work for another 2 weeks and at that point she can return to work but she is to continue offloading monitor the foot daily.  I will follow-up with her in 3 weeks or sooner if needed.  Vivi Barrack DPM

## 2021-02-17 ENCOUNTER — Ambulatory Visit (INDEPENDENT_AMBULATORY_CARE_PROVIDER_SITE_OTHER): Payer: BC Managed Care – PPO | Admitting: Podiatry

## 2021-02-17 ENCOUNTER — Other Ambulatory Visit: Payer: Self-pay

## 2021-02-17 DIAGNOSIS — Z794 Long term (current) use of insulin: Secondary | ICD-10-CM | POA: Diagnosis not present

## 2021-02-17 DIAGNOSIS — L97512 Non-pressure chronic ulcer of other part of right foot with fat layer exposed: Secondary | ICD-10-CM | POA: Diagnosis not present

## 2021-02-17 DIAGNOSIS — M2031 Hallux varus (acquired), right foot: Secondary | ICD-10-CM

## 2021-02-17 DIAGNOSIS — E1142 Type 2 diabetes mellitus with diabetic polyneuropathy: Secondary | ICD-10-CM

## 2021-02-17 NOTE — Patient Instructions (Signed)

## 2021-02-19 NOTE — Progress Notes (Signed)
Subjective: 65 year old female presents the office today for reoccurrence and follow-up evaluation of the ulcer at the distal aspect of her right big toe.  She states the wound is healed and she is back to her regular shoe and she is also returned back to work.  She has not seen any opening or any swelling or redness.    She is scheduled to retire in August.   Objective: AAO x3, NAD-presents wearing regular shoe DP/PT pulses palpable bilaterally, CRT less than 3 seconds Sensation decreased with Semmes Weinstein monofilament. Hallux malleus is present there is no significant hyperkeratotic tissue today and there is no ulceration identified.  There is no edema, erythema or signs of infection.  There is no fluctuance or crepitation.  No ulcerations identified today. No malodor.  No pain with calf compression, swelling, warmth, erythema  Assessment: Recurrence of ulceration, hallux malleus-improved  Plan: -All treatment options discussed with the patient including all alternatives, risks, complications.  -No significant hyperkeratotic tissue today.  The wound is healed.  Continue offloading and daily foot inspection.  Continue with supportive shoe gear as well.  Discussed glucose control.  Vivi Barrack DPM

## 2021-03-01 DIAGNOSIS — Z8673 Personal history of transient ischemic attack (TIA), and cerebral infarction without residual deficits: Secondary | ICD-10-CM | POA: Diagnosis not present

## 2021-03-01 DIAGNOSIS — E1165 Type 2 diabetes mellitus with hyperglycemia: Secondary | ICD-10-CM | POA: Diagnosis not present

## 2021-03-01 DIAGNOSIS — E1142 Type 2 diabetes mellitus with diabetic polyneuropathy: Secondary | ICD-10-CM | POA: Diagnosis not present

## 2021-03-01 DIAGNOSIS — Z794 Long term (current) use of insulin: Secondary | ICD-10-CM | POA: Diagnosis not present

## 2021-03-15 DIAGNOSIS — I10 Essential (primary) hypertension: Secondary | ICD-10-CM | POA: Diagnosis not present

## 2021-09-24 ENCOUNTER — Other Ambulatory Visit: Payer: Self-pay | Admitting: Family Medicine

## 2021-09-25 NOTE — Telephone Encounter (Signed)
Sent my chart message 09/25/21 °

## 2021-09-26 NOTE — Telephone Encounter (Signed)
Has appointment on 09/27/21

## 2021-09-27 ENCOUNTER — Ambulatory Visit: Payer: Medicare Other | Admitting: Family Medicine

## 2021-10-02 ENCOUNTER — Ambulatory Visit (INDEPENDENT_AMBULATORY_CARE_PROVIDER_SITE_OTHER): Payer: BC Managed Care – PPO | Admitting: Family Medicine

## 2021-10-02 ENCOUNTER — Other Ambulatory Visit: Payer: Self-pay

## 2021-10-02 VITALS — BP 122/60 | HR 63 | Temp 98.5°F | Ht 62.0 in | Wt 169.0 lb

## 2021-10-02 DIAGNOSIS — E11621 Type 2 diabetes mellitus with foot ulcer: Secondary | ICD-10-CM

## 2021-10-02 DIAGNOSIS — L97519 Non-pressure chronic ulcer of other part of right foot with unspecified severity: Secondary | ICD-10-CM

## 2021-10-02 DIAGNOSIS — I10 Essential (primary) hypertension: Secondary | ICD-10-CM

## 2021-10-02 DIAGNOSIS — E785 Hyperlipidemia, unspecified: Secondary | ICD-10-CM

## 2021-10-02 DIAGNOSIS — Z862 Personal history of diseases of the blood and blood-forming organs and certain disorders involving the immune mechanism: Secondary | ICD-10-CM | POA: Diagnosis not present

## 2021-10-02 DIAGNOSIS — E1165 Type 2 diabetes mellitus with hyperglycemia: Secondary | ICD-10-CM

## 2021-10-02 MED ORDER — ATORVASTATIN CALCIUM 10 MG PO TABS: ORAL_TABLET | ORAL | 3 refills | Status: DC

## 2021-10-02 NOTE — Assessment & Plan Note (Signed)
No evidence of infection.  Needs follow-up with podiatry for debridement.  Needs very close monitoring given her prior history.

## 2021-10-02 NOTE — Progress Notes (Signed)
Subjective:  Patient ID: [name] [name], female    DOB: [birth year]  Age: 66 y.o. MRN: [id number]  CC: Chief Complaint  Patient presents with   essential hypertension    type 2 DM    Sees Dr Waneta Martins endocrine    HPI:  66 year old female with a longstanding history of uncontrolled diabetes with complications, hypertension, hyperlipidemia presents to establish care with me.  Patient's diabetes has been managed by endocrinology, Dr. Buddy Duty.  Per the EMR, she has not been seen since September of last year.  Unsure of her last A1c.  She reports that her last A1c was 7.5.  She is currently on insulin.  Patient is on Lantus.  When asked to tell me her dose of insulin, patient states that it varies.  She is not taking it as prescribed due to reported hypoglycemia.  Patient does not check her fasting blood sugars.  She states that she checks her blood sugar at night and it typically ranges from the 170s to 180s.  Patient is also on metformin 500 mg twice daily.  Patient has a current wound of her right great toe.  She states that it occurred after she injured her toe.  She has a history of diabetic ulcer and also has a history of osteomyelitis.  Will examined today.  Patient denies any redness or drainage from the wound.  Unsure of control regarding her hyperlipidemia.  She has not had laboratory studies regarding lipids since 2019.  Patient's blood pressure is well controlled.  She is currently on losartan.  Patient Active Problem List   Diagnosis Date Noted   Uncontrolled type 2 diabetes mellitus with hyperglycemia ([place]) 10/02/2021   Diabetic ulcer of right great toe ([place]) 10/02/2021   Diabetic retinopathy associated with type 2 diabetes mellitus ([place]) 12/22/2020   History of CVA (cerebrovascular accident) 12/22/2020   Long term (current) use of insulin ([place]) 12/22/2020   Polyneuropathy due to type 2 diabetes mellitus ([place]) 12/22/2020   Heart murmur, systolic 78/24/2353   HLD (hyperlipidemia)  04/18/2018   Hyperlipidemia associated with type 2 diabetes mellitus ([place]) 03/02/2018   Essential hypertension 02/24/2018   History of osteomyelitis- rt great toe 04/22/2016    Social Hx   Social History   Socioeconomic History   Marital status: Married    Spouse name: Engineer, materials   Number of children: 2   Years of education: Not on file   Highest education level: Some college, no degree  Occupational History    Employer: [employer]  Tobacco Use   Smoking status: Never   Smokeless tobacco: Never  Substance and Sexual Activity   Alcohol use: No   Drug use: No   Sexual activity: Not on file  Other Topics Concern   Not on file  Social History Narrative   Patient is left-handed. She lives with her husband in a one level home with a basement. She occasionally drinks coffee and has 2-3 diest sodas a day. She does not exercise.   Social Determinants of Health   Financial Resource Strain: Not on file  Food Insecurity: Not on file  Transportation Needs: Not on file  Physical Activity: Not on file  Stress: Not on file  Social Connections: Not on file    Review of Systems  Constitutional: Negative.   Skin:  Positive for wound.  Per HPI  Objective:  BP 122/60    Pulse 63    Temp 98.5 F (36.9 C)    Ht 5' 2"  (1.575  m)    Wt 169 lb (76.7 kg)    SpO2 97%    BMI 30.91 kg/m   BP/Weight 10/02/2021 12/26/2020 55/37/4827  Systolic BP 078 675 449  Diastolic BP 60 74 83  Wt. (Lbs) 169 169 -  BMI 30.91 30.91 -    Physical Exam Vitals and nursing note reviewed.  Constitutional:      General: She is not in acute distress.    Appearance: Normal appearance. She is not ill-appearing.  HENT:     Head: Normocephalic and atraumatic.  Cardiovascular:     Rate and Rhythm: Normal rate and regular rhythm.     Heart sounds: Murmur heard.  Pulmonary:     Effort: Pulmonary effort is normal.     Breath sounds: Normal breath sounds. No wheezing, rhonchi or rales.  Musculoskeletal:     Comments:  Right great toe -onychomycosis noted.  The distal tip of the toe with a small superficial wound.  No drainage.  No surrounding erythema.  Neurological:     Mental Status: She is alert.  Psychiatric:        Mood and Affect: Mood normal.        Behavior: Behavior normal.    Lab Results  Component Value Date   WBC 7.3 05/12/2020   HGB 12.5 05/12/2020   HCT 38.1 05/12/2020   PLT 283 05/12/2020   GLUCOSE 269 (H) 05/12/2020   CHOL 161 02/25/2018   TRIG 198 (H) 02/25/2018   HDL 43 02/25/2018   LDLCALC 78 02/25/2018   ALT 24 03/09/2019   AST 25 03/09/2019   NA 136 05/12/2020   K 4.6 05/12/2020   CL 98 05/12/2020   CREATININE 1.08 (H) 05/12/2020   BUN 18 05/12/2020   CO2 24 05/12/2020   TSH 1.960 02/24/2018   INR 1.1 03/09/2019   HGBA1C 9.3 (H) 04/18/2018     Assessment & Plan:   Problem List Items Addressed This Visit       Cardiovascular and Mediastinum   Essential hypertension    Blood pressure well controlled.  Continue losartan.      Relevant Medications   losartan (COZAAR) 50 MG tablet   atorvastatin (LIPITOR) 10 MG tablet     Endocrine   Uncontrolled type 2 diabetes mellitus with hyperglycemia (HCC) - Primary    A1c today.  Continue following with endocrinology.  Suspect noncompliance is playing a role.  Continue insulin and metformin.      Relevant Medications   losartan (COZAAR) 50 MG tablet   atorvastatin (LIPITOR) 10 MG tablet   Other Relevant Orders   CMP14+EGFR   Hemoglobin A1c   Microalbumin / creatinine urine ratio   Diabetic ulcer of right great toe (HCC)    No evidence of infection.  Needs follow-up with podiatry for debridement.  Needs very close monitoring given her prior history.      Relevant Medications   losartan (COZAAR) 50 MG tablet   atorvastatin (LIPITOR) 10 MG tablet     Other   HLD (hyperlipidemia)    Has been previously well controlled.  Lipid panel today.  Continue Lipitor.      Relevant Medications   losartan (COZAAR) 50  MG tablet   atorvastatin (LIPITOR) 10 MG tablet   Other Relevant Orders   Lipid panel   Other Visit Diagnoses     History of anemia       Relevant Orders   CBC       Meds ordered this encounter  Medications   atorvastatin (LIPITOR) 10 MG tablet    Sig: TAKE 1 TABLET BY MOUTH ONCE DAILY AT 6:00 PM    Dispense:  90 tablet    Refill:  3    Follow-up:  3-6 months   Bruno DO [place]

## 2021-10-02 NOTE — Assessment & Plan Note (Signed)
A1c today.  Continue following with endocrinology.  Suspect noncompliance is playing a role.  Continue insulin and metformin.

## 2021-10-02 NOTE — Patient Instructions (Signed)
Labs ordered.  I have refilled your medication.  Follow up in 3-6 months.  Please be sure to get your mammogram and follow up with Dr. Jacqualyn Posey.

## 2021-10-02 NOTE — Assessment & Plan Note (Signed)
Blood pressure well controlled.  Continue losartan.

## 2021-10-02 NOTE — Assessment & Plan Note (Signed)
Has been previously well controlled.  Lipid panel today.  Continue Lipitor.

## 2021-10-03 LAB — LIPID PANEL
Chol/HDL Ratio: 2.7 ratio (ref 0.0–4.4)
Cholesterol, Total: 156 mg/dL (ref 100–199)
HDL: 58 mg/dL (ref >39)
LDL Chol Calc (NIH): 65 mg/dL (ref 0–99)
Triglycerides: 205 mg/dL — ABNORMAL HIGH (ref 0–149)
VLDL Cholesterol Cal: 33 mg/dL (ref 5–40)

## 2021-10-03 LAB — CMP14+EGFR
ALT: 19 IU/L (ref 0–32)
AST: 23 IU/L (ref 0–40)
Albumin/Globulin Ratio: 1.2 (ref 1.2–2.2)
Albumin: 4.2 g/dL (ref 3.8–4.8)
Alkaline Phosphatase: 73 IU/L (ref 44–121)
BUN/Creatinine Ratio: 13 (ref 12–28)
BUN: 14 mg/dL (ref 8–27)
Bilirubin Total: 0.2 mg/dL (ref 0.0–1.2)
CO2: 24 mmol/L (ref 20–29)
Calcium: 10.2 mg/dL (ref 8.7–10.3)
Chloride: 102 mmol/L (ref 96–106)
Creatinine, Ser: 1.11 mg/dL — ABNORMAL HIGH (ref 0.57–1.00)
Globulin, Total: 3.4 g/dL (ref 1.5–4.5)
Glucose: 200 mg/dL — ABNORMAL HIGH (ref 70–99)
Potassium: 5 mmol/L (ref 3.5–5.2)
Sodium: 139 mmol/L (ref 134–144)
Total Protein: 7.6 g/dL (ref 6.0–8.5)
eGFR: 54 mL/min/{1.73_m2} — ABNORMAL LOW (ref >59)

## 2021-10-03 LAB — CBC
Hematocrit: 39 % (ref 34.0–46.6)
Hemoglobin: 12.5 g/dL (ref 11.1–15.9)
MCH: 26.8 pg (ref 26.6–33.0)
MCHC: 32.1 g/dL (ref 31.5–35.7)
MCV: 84 fL (ref 79–97)
Platelets: 340 10*3/uL (ref 150–450)
RBC: 4.66 x10E6/uL (ref 3.77–5.28)
RDW: 15.1 % (ref 11.7–15.4)
WBC: 9.4 10*3/uL (ref 3.4–10.8)

## 2021-10-03 LAB — MICROALBUMIN / CREATININE URINE RATIO
Creatinine, Urine: 231.7 mg/dL
Microalb/Creat Ratio: 108 mg/g creat — ABNORMAL HIGH (ref 0–29)
Microalbumin, Urine: 250.3 ug/mL

## 2021-10-03 LAB — HEMOGLOBIN A1C
Est. average glucose Bld gHb Est-mCnc: 235 mg/dL
Hgb A1c MFr Bld: 9.8 % — ABNORMAL HIGH (ref 4.8–5.6)

## 2021-10-04 ENCOUNTER — Other Ambulatory Visit: Payer: Self-pay | Admitting: Family Medicine

## 2021-10-04 DIAGNOSIS — R809 Proteinuria, unspecified: Secondary | ICD-10-CM

## 2021-11-21 ENCOUNTER — Telehealth: Payer: Self-pay

## 2021-11-21 NOTE — Telephone Encounter (Signed)
Pt called in stating for the an ov in Feb 2023, she received a bill for $260.00. Pt states that she has never had any thing more to pay other than a $25 copay. Pt states that she would like to have this claim resubmitted to the insurance. Please advise. ? ?Cb#: [phone] ?

## 2021-11-24 ENCOUNTER — Other Ambulatory Visit (HOSPITAL_COMMUNITY): Payer: Self-pay | Admitting: Nephrology

## 2021-11-24 DIAGNOSIS — E1122 Type 2 diabetes mellitus with diabetic chronic kidney disease: Secondary | ICD-10-CM

## 2021-11-24 DIAGNOSIS — Z8631 Personal history of diabetic foot ulcer: Secondary | ICD-10-CM

## 2021-11-24 DIAGNOSIS — I129 Hypertensive chronic kidney disease with stage 1 through stage 4 chronic kidney disease, or unspecified chronic kidney disease: Secondary | ICD-10-CM

## 2021-11-24 DIAGNOSIS — I5032 Chronic diastolic (congestive) heart failure: Secondary | ICD-10-CM

## 2021-11-24 DIAGNOSIS — R809 Proteinuria, unspecified: Secondary | ICD-10-CM

## 2021-12-06 ENCOUNTER — Ambulatory Visit (HOSPITAL_COMMUNITY)
Admission: RE | Admit: 2021-12-06 | Discharge: 2021-12-06 | Disposition: A | Discharge status: Home / Self Care | Payer: BC Managed Care – PPO | Source: Ambulatory Visit | Attending: Nephrology | Admitting: Nephrology

## 2021-12-06 ENCOUNTER — Other Ambulatory Visit (HOSPITAL_COMMUNITY): Payer: Self-pay | Admitting: Internal Medicine

## 2021-12-06 DIAGNOSIS — Z8631 Personal history of diabetic foot ulcer: Secondary | ICD-10-CM | POA: Insufficient documentation

## 2021-12-06 DIAGNOSIS — E1129 Type 2 diabetes mellitus with other diabetic kidney complication: Secondary | ICD-10-CM | POA: Insufficient documentation

## 2021-12-06 DIAGNOSIS — I129 Hypertensive chronic kidney disease with stage 1 through stage 4 chronic kidney disease, or unspecified chronic kidney disease: Secondary | ICD-10-CM | POA: Diagnosis present

## 2021-12-06 DIAGNOSIS — Z13820 Encounter for screening for osteoporosis: Secondary | ICD-10-CM

## 2021-12-06 DIAGNOSIS — I5032 Chronic diastolic (congestive) heart failure: Secondary | ICD-10-CM | POA: Insufficient documentation

## 2021-12-06 DIAGNOSIS — E1122 Type 2 diabetes mellitus with diabetic chronic kidney disease: Secondary | ICD-10-CM | POA: Diagnosis not present

## 2021-12-06 DIAGNOSIS — N181 Chronic kidney disease, stage 1: Secondary | ICD-10-CM | POA: Insufficient documentation

## 2021-12-06 DIAGNOSIS — R809 Proteinuria, unspecified: Secondary | ICD-10-CM | POA: Insufficient documentation

## 2021-12-06 DIAGNOSIS — Z8489 Family history of other specified conditions: Secondary | ICD-10-CM

## 2021-12-27 ENCOUNTER — Other Ambulatory Visit (HOSPITAL_COMMUNITY): Payer: Self-pay | Admitting: Internal Medicine

## 2021-12-27 DIAGNOSIS — Z8489 Family history of other specified conditions: Secondary | ICD-10-CM

## 2021-12-27 DIAGNOSIS — M810 Age-related osteoporosis without current pathological fracture: Secondary | ICD-10-CM

## 2022-01-02 ENCOUNTER — Ambulatory Visit: Payer: Medicare Other | Admitting: Family Medicine

## 2022-01-03 ENCOUNTER — Ambulatory Visit (HOSPITAL_COMMUNITY)
Admission: RE | Admit: 2022-01-03 | Discharge: 2022-01-03 | Disposition: A | Discharge status: Home / Self Care | Payer: BC Managed Care – PPO | Source: Ambulatory Visit | Attending: Internal Medicine | Admitting: Internal Medicine

## 2022-01-03 DIAGNOSIS — M810 Age-related osteoporosis without current pathological fracture: Secondary | ICD-10-CM | POA: Diagnosis present

## 2022-01-03 DIAGNOSIS — Z8489 Family history of other specified conditions: Secondary | ICD-10-CM | POA: Diagnosis present

## 2022-01-17 ENCOUNTER — Ambulatory Visit: Payer: BC Managed Care – PPO | Admitting: Podiatry

## 2022-01-19 ENCOUNTER — Ambulatory Visit (INDEPENDENT_AMBULATORY_CARE_PROVIDER_SITE_OTHER): Payer: BC Managed Care – PPO

## 2022-01-19 ENCOUNTER — Ambulatory Visit: Payer: BC Managed Care – PPO | Admitting: Podiatry

## 2022-01-19 DIAGNOSIS — M2031 Hallux varus (acquired), right foot: Secondary | ICD-10-CM | POA: Diagnosis not present

## 2022-01-19 DIAGNOSIS — Z5189 Encounter for other specified aftercare: Secondary | ICD-10-CM | POA: Diagnosis not present

## 2022-01-19 DIAGNOSIS — L97512 Non-pressure chronic ulcer of other part of right foot with fat layer exposed: Secondary | ICD-10-CM

## 2022-01-19 MED ORDER — DOXYCYCLINE HYCLATE 100 MG PO TABS: 100.0000 mg | ORAL_TABLET | Freq: Two times a day (BID) | ORAL | 0 refills | Status: DC

## 2022-01-21 NOTE — Progress Notes (Signed)
Subjective: 66 year old female presents the office today for reoccurrence of the wound to the tip of the right big toe.  She did wear a surgical shoe since she noticed this.  She is not sure how it opened back up.  No injury that she reports.  Bloody drainage as well as some minimal swelling or redness.  She denies any fevers or chills.  Objective: AAO x3, NAD-presents wearing regular shoe DP/PT pulses palpable bilaterally, CRT less than 3 seconds Sensation decreased with Semmes Weinstein monofilament. Hallux malleus is present in the distal aspect is superficial granular wound without any probing, undermining or tunneling.  Minimal edema and minimal erythema without any ascending cellulitis.  No fluctuance crepitation.  No malodor No malodor.  No pain with calf compression, swelling, warmth, erythema         Assessment: Recurrence of ulceration, hallux malleus  Plan: -All treatment options discussed with the patient including all alternatives, risks, complications.  -X-rays were obtained reviewed of the right foot.  3 views were obtained.  Hallux malleus is present.  There is chronic changes noted the distal portion of the distal phalanx. -Sharply debrided the wound today utilizing for 312 with scalpel and healthy tissue.  Silvadene was applied followed by dressing.  Continue daily dressing changes. -Prescribed doxycycline. -Continue offloading at all times -Discussed surgical intervention given the continued recurrence of the wound.  Discussed also she is to keep her glucose better controlled to help facilitate wound healing but also if she were to have surgery for this.  Discussed with her addressing the hallux malleus versus distal phalanx amputation given the wound as well as changes on x-rays.  We discussed the surgery we will further discuss this as well. -Monitor for any clinical signs or symptoms of infection and directed to call the office immediately should any occur or go to  the ER.  Return in about 3 weeks (around 02/09/2022).  Vivi Barrack DPM

## 2022-01-23 ENCOUNTER — Ambulatory Visit (HOSPITAL_COMMUNITY)
Admission: RE | Admit: 2022-01-23 | Discharge: 2022-01-23 | Disposition: A | Discharge status: Home / Self Care | Payer: BC Managed Care – PPO | Source: Ambulatory Visit | Attending: Podiatry | Admitting: Podiatry

## 2022-01-23 DIAGNOSIS — L97512 Non-pressure chronic ulcer of other part of right foot with fat layer exposed: Secondary | ICD-10-CM | POA: Insufficient documentation

## 2022-01-31 ENCOUNTER — Ambulatory Visit: Payer: BC Managed Care – PPO | Admitting: Podiatry

## 2022-02-01 ENCOUNTER — Other Ambulatory Visit: Payer: Self-pay | Admitting: Podiatry

## 2022-02-01 DIAGNOSIS — L97512 Non-pressure chronic ulcer of other part of right foot with fat layer exposed: Secondary | ICD-10-CM

## 2022-02-09 ENCOUNTER — Ambulatory Visit (INDEPENDENT_AMBULATORY_CARE_PROVIDER_SITE_OTHER): Payer: BC Managed Care – PPO | Admitting: Podiatry

## 2022-02-09 DIAGNOSIS — L97512 Non-pressure chronic ulcer of other part of right foot with fat layer exposed: Secondary | ICD-10-CM

## 2022-02-09 DIAGNOSIS — M2031 Hallux varus (acquired), right foot: Secondary | ICD-10-CM | POA: Diagnosis not present

## 2022-02-10 NOTE — Progress Notes (Signed)
Subjective: 66-year-old female presents to the office today for evaluation of a wound on the right big toe.  She states it is getting better and has been using Iodosorb.  Denies any drainage or pus.  No swelling or redness.  No fevers or chills that she reports no new concerns.  She states that her blood sugar still runs high at nighttime sometimes 300.  She is working on getting this down.  Objective: AAO x3, NAD-presents wearing regular shoe DP/PT pulses palpable bilaterally, CRT less than 3 seconds Sensation decreased with Semmes Weinstein monofilament. Hallux malleus is present in the distal aspect is superficial granular wound without any probing, undermining or tunneling.  Wound appears to be smaller today.  There is no probing, no tunneling.  There is hyperkeratotic periwound.  There is no probing, or tunneling.  There is no fluctuation or crepitation.  No malodor No pain with calf compression, swelling, warmth, erythema          Assessment: Recurrence of ulceration, hallux malleus-improving  Plan: -All treatment options discussed with the patient including all alternatives, risks, complications.  -Sharply debrided the callus, wound today utilizing #312 with scalpel without any complications or bleeding.  She is in continue with her dressing changes.  Continue offloading at all times.  Monitor for any signs or symptoms of infection.  Glucose control.  Return for 3-4 weeks for ulcer.  Tyvion Edmondson R Kyng Matlock DPM 

## 2022-02-19 ENCOUNTER — Ambulatory Visit: Payer: Medicare Other | Admitting: Podiatry

## 2022-02-28 ENCOUNTER — Encounter: Payer: Self-pay | Admitting: Vascular Surgery

## 2022-02-28 ENCOUNTER — Ambulatory Visit: Payer: BC Managed Care – PPO | Admitting: Vascular Surgery

## 2022-02-28 VITALS — BP 151/79 | HR 65 | Temp 97.9°F | Resp 16 | Ht 62.0 in | Wt 168.2 lb

## 2022-02-28 DIAGNOSIS — I739 Peripheral vascular disease, unspecified: Secondary | ICD-10-CM

## 2022-02-28 NOTE — Progress Notes (Signed)
[facility] [place]  Patient name: [name] [name] MRN: [id number] DOB: 1956/01/19 Sex: female  REASON FOR CONSULT: Evaluation lower extremity arterial insufficiency  HPI: [name] [name] is a 66 y.o. female, who is here today for evaluation of lower extremity arterial insufficiency.  She has a greater than 20-year history of diabetes.  She has had a fracture of her right great toe on several occasions and has a downward projecting deformity on the tip of her toe.  She has had recurrent ulceration over this due to pressure.  She has seen Dr. Loreta Ave for this and had noninvasive studies and is here today to discuss these.  She does not have any claudication symptoms.  She is somewhat limited in her walking ability due to vertigo.  She reports stroke several years ago and has had vertigo since this time.  She has no history of rest pain.  Past Medical History:  Diagnosis Date   CVA (cerebral vascular accident) (HCC) 02/24/2018   Diabetes (HCC) 02/24/2018   Hypertension    Microproteinuria    Neuropathy    Noncompliance    Osteopenia    Stroke (HCC)    residual "balance issues"   Type 2 diabetes mellitus (HCC)    Vertigo     Family History  Problem Relation Age of Onset   Diabetes Mother    Breast cancer Mother    Diabetes Father    Deep vein thrombosis Sister     SOCIAL HISTORY: Social History   Socioeconomic History   Marital status: Married    Spouse name: Educational psychologist   Number of children: 2   Years of education: Not on file   Highest education level: Some college, no degree  Occupational History    Employer: [employer]  Tobacco Use   Smoking status: Never    Passive exposure: Past   Smokeless tobacco: Never  Substance and Sexual Activity   Alcohol use: No   Drug use: No   Sexual activity: Not on file  Other Topics Concern   Not on file  Social History Narrative   Patient is left-handed. She lives with her husband in  a one level home with a basement. She occasionally drinks coffee and has 2-3 diest sodas a day. She does not exercise.   Social Determinants of Health   Financial Resource Strain: Not on file  Food Insecurity: Not on file  Transportation Needs: Not on file  Physical Activity: Not on file  Stress: Not on file  Social Connections: Not on file  Intimate Partner Violence: Not on file    Allergies  Allergen Reactions   Ace Inhibitors Cough   Glipizide    Glyburide Other (See Comments)    Other reaction(s): hypoglycemia and weight gain   Liraglutide     Other reaction(s): stomach upset   Meclizine Hcl     Current Outpatient Medications  Medication Sig Dispense Refill   aspirin 81 MG EC tablet 1 tablet     atorvastatin (LIPITOR) 10 MG tablet TAKE 1 TABLET BY MOUTH ONCE DAILY AT 6:00 PM 90 tablet 3   Cyanocobalamin (B-12 PO) Take 2 tablets by mouth daily.      insulin glargine (LANTUS) 100 UNIT/ML Solostar Pen Inject 60 Units into the skin at bedtime.     Insulin Pen Needle (RELION PEN NEEDLES) 32G X 4 MM MISC USE ONE  ONCE DAILY     losartan (COZAAR) 50 MG tablet Take 50 mg by mouth  daily.     metFORMIN (GLUCOPHAGE-XR) 500 MG 24 hr tablet Take by mouth.     Multiple Vitamins-Minerals (ONE-A-DAY WOMENS VITACRAVES) CHEW Chew 1 tablet by mouth daily.     doxycycline (VIBRA-TABS) 100 MG tablet Take 1 tablet (100 mg total) by mouth 2 (two) times daily. (Patient not taking: Reported on 02/28/2022) 20 tablet 0   No current facility-administered medications for this visit.    REVIEW OF SYSTEMS:  [X]  denotes positive finding, [ ]  denotes negative finding Cardiac  Comments:  Chest pain or chest pressure:    Shortness of breath upon exertion:    Short of breath when lying flat:    Irregular heart rhythm:        Vascular    Pain in calf, thigh, or hip brought on by ambulation: x   Pain in feet at night that wakes you up from your sleep:     Blood clot in your veins:    Leg swelling:  x        Pulmonary    Oxygen at home:    Productive cough:     Wheezing:         Neurologic    Sudden weakness in arms or legs:     Sudden numbness in arms or legs:     Sudden onset of difficulty speaking or slurred speech:    Temporary loss of vision in one eye:     Problems with dizziness:         Gastrointestinal    Blood in stool:     Vomited blood:         Genitourinary    Burning when urinating:     Blood in urine:        Psychiatric    Major depression:         Hematologic    Bleeding problems:    Problems with blood clotting too easily:        Skin    Rashes or ulcers:        Constitutional    Fever or chills:      PHYSICAL EXAM: Vitals:   02/28/22 1408  BP: (!) 151/79  Pulse: 65  Resp: 16  Temp: 97.9 F (36.6 C)  TempSrc: Temporal  SpO2: 97%  Weight: 168 lb 3.2 oz (76.3 kg)  Height: 5\' 2"  (1.575 m)    GENERAL: The patient is a well-nourished female, in no acute distress. The vital signs are documented above. CARDIOVASCULAR: 2+ radial and 2+ popliteal pulses bilaterally.  I do not palpate pedal pulses on the right.  She has a 1-2+ dorsalis pedis pulse on the left PULMONARY: There is good air exchange  MUSCULOSKELETAL: There are no major deformities or cyanosis. NEUROLOGIC: No focal weakness or paresthesias are detected. SKIN: There are no ulcers or rashes noted. PSYCHIATRIC: The patient has a normal affect.  DATA:  Noninvasive studies from 01/23/2022 were reviewed with the patient.  This shows calcification making ankle arm indices unreliable.  She does have biphasic waveforms at the pedal level bilaterally.  She has normal toe-brachial index bilaterally and normal waveforms in her digits bilaterally.  MEDICAL ISSUES: Discussed these findings at length with the patient.  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  I do not feel that she has any evidence  of critical limb ischemia and is not symptomatic from her disease.  She will  continue to keep a close eye on her lower extremities and seek attention should she develop ulceration.  She will see [place] again on an as-needed basis   Larina Earthly, MD [facility] Tel [phone] Pager [phone]  Note: Portions of this report may have been transcribed using voice recognition software.  Every effort has been made to ensure accuracy; however, inadvertent computerized transcription errors may still be present.

## 2022-03-02 ENCOUNTER — Ambulatory Visit (INDEPENDENT_AMBULATORY_CARE_PROVIDER_SITE_OTHER): Payer: BC Managed Care – PPO | Admitting: Podiatry

## 2022-03-02 DIAGNOSIS — M2031 Hallux varus (acquired), right foot: Secondary | ICD-10-CM | POA: Diagnosis not present

## 2022-03-02 DIAGNOSIS — L97512 Non-pressure chronic ulcer of other part of right foot with fat layer exposed: Secondary | ICD-10-CM

## 2022-03-02 NOTE — Progress Notes (Unsigned)
Subjective: 66 year old female presents to the office today for evaluation of a wound on the right big toe.  She states it is getting better and has been using Iodosorb.  Denies any drainage or pus.  No swelling or redness.  No fevers or chills that she reports no new concerns.  She states that her blood sugar still runs high at nighttime sometimes 300.  She is working on getting this down.  Objective: AAO x3, NAD-presents wearing regular shoe DP/PT pulses palpable bilaterally, CRT less than 3 seconds Sensation decreased with Semmes Weinstein monofilament. Hallux malleus is present in the distal aspect is superficial granular wound without any probing, undermining or tunneling.  Wound appears to be smaller today.  There is no probing, no tunneling.  There is hyperkeratotic periwound.  There is no probing, or tunneling.  There is no fluctuation or crepitation.  No malodor No pain with calf compression, swelling, warmth, erythema          Assessment: Recurrence of ulceration, hallux malleus-improving  Plan: -All treatment options discussed with the patient including all alternatives, risks, complications.  -Sharply debrided the callus, wound today utilizing #312 with scalpel without any complications or bleeding.  She is in continue with her dressing changes.  Continue offloading at all times.  Monitor for any signs or symptoms of infection.  Glucose control.  Return for 3-4 weeks for ulcer.  Vivi Barrack DPM

## 2022-04-17 ENCOUNTER — Ambulatory Visit: Payer: BC Managed Care – PPO | Admitting: Podiatry

## 2022-04-17 DIAGNOSIS — M2031 Hallux varus (acquired), right foot: Secondary | ICD-10-CM | POA: Diagnosis not present

## 2022-04-17 DIAGNOSIS — L97512 Non-pressure chronic ulcer of other part of right foot with fat layer exposed: Secondary | ICD-10-CM | POA: Diagnosis not present

## 2022-04-17 DIAGNOSIS — M79675 Pain in left toe(s): Secondary | ICD-10-CM

## 2022-04-17 DIAGNOSIS — E1142 Type 2 diabetes mellitus with diabetic polyneuropathy: Secondary | ICD-10-CM

## 2022-04-17 DIAGNOSIS — B351 Tinea unguium: Secondary | ICD-10-CM

## 2022-04-17 DIAGNOSIS — M79674 Pain in right toe(s): Secondary | ICD-10-CM | POA: Diagnosis not present

## 2022-04-24 NOTE — Progress Notes (Signed)
Subjective: Chief Complaint  Patient presents with   Wound Check    Rm 12 Right 1st toe wound check. PT states wound is healing and is now closed.    Nail Problem    Pt interested in having nails trim. Pt states she is having a difficult time cutting her nails due to nail being too thick.     66 year old female presents the above concerns.  She states the wound has been doing well on the years to be healed.  No swelling redness or any drainage.  Nails be trimmed as are thickened elongated she has difficulty trimming them herself  Blood sugar still runs high at times but states that she is continuing to work on this.  Objective: AAO x3, NAD-presents wearing regular shoe DP/PT pulses palpable bilaterally, CRT less than 3 seconds Sensation decreased with Semmes Weinstein monofilament. Hallux malleus is present in the distal aspect with very minimal hyperkeratotic tissue.  There is no ongoing ulceration noted.  No open lesions bilaterally. Nails are hypertrophic, dystrophic, brittle, discolored, elongated 10. No surrounding redness or drainage. Tenderness nails 1-5 bilaterally. No open lesions or pre-ulcerative lesions are identified today. No pain with calf compression, swelling, warmth, erythema   Assessment: Preulcerative callus left hallux,   Plan: -All treatment options discussed with the patient including all alternatives, risks, complications.  -Sharply debrided the nails x10 without any complications or bleeding. -Intact tissue left hallux.  The debridement was present.  Which is clearly any complications or bleeding.  Continue offloading at all times -Daily foot inspection encouraged glucose control  Return in about 2 months (around 06/19/2022).  Vivi Barrack DPM

## 2022-05-04 ENCOUNTER — Telehealth: Payer: Self-pay | Admitting: *Deleted

## 2022-05-04 NOTE — Telephone Encounter (Signed)
Patient is calling to request an antibiotic for her right great toe and a new boot sent to [facility](one that she has is worn out),please advise.

## 2022-05-05 MED ORDER — DOXYCYCLINE HYCLATE 100 MG PO TABS: 100.0000 mg | ORAL_TABLET | Freq: Two times a day (BID) | ORAL | 0 refills | Status: DC

## 2022-05-07 ENCOUNTER — Other Ambulatory Visit: Payer: Self-pay | Admitting: Podiatry

## 2022-05-07 MED ORDER — MUPIROCIN 2 % EX OINT: 1.0000 | TOPICAL_OINTMENT | Freq: Two times a day (BID) | CUTANEOUS | 2 refills | Status: DC

## 2022-05-07 NOTE — Telephone Encounter (Signed)
Patient is calling because her right great toe has opened back up  Requesting antibiotic, antibiotic cream,please advise.  The toe was doing better at last visit but it has become worse. Spoke with patient to try to get more information about appearance of the toe,if she is having pain, phone went out and was unable to reach. Please advise.

## 2022-05-08 ENCOUNTER — Emergency Department (HOSPITAL_COMMUNITY): Payer: BC Managed Care – PPO

## 2022-05-08 ENCOUNTER — Inpatient Hospital Stay (HOSPITAL_COMMUNITY)
Admission: EM | Admit: 2022-05-08 | Discharge: 2022-05-11 | DRG: 617 | Disposition: A | Discharge status: Home / Self Care | Payer: BC Managed Care – PPO | Attending: Internal Medicine | Admitting: Internal Medicine

## 2022-05-08 ENCOUNTER — Ambulatory Visit (INDEPENDENT_AMBULATORY_CARE_PROVIDER_SITE_OTHER): Payer: BC Managed Care – PPO | Admitting: Podiatry

## 2022-05-08 ENCOUNTER — Ambulatory Visit (INDEPENDENT_AMBULATORY_CARE_PROVIDER_SITE_OTHER): Payer: BC Managed Care – PPO

## 2022-05-08 ENCOUNTER — Encounter (HOSPITAL_COMMUNITY): Payer: Self-pay

## 2022-05-08 DIAGNOSIS — Z803 Family history of malignant neoplasm of breast: Secondary | ICD-10-CM

## 2022-05-08 DIAGNOSIS — Z888 Allergy status to other drugs, medicaments and biological substances status: Secondary | ICD-10-CM

## 2022-05-08 DIAGNOSIS — M00871 Arthritis due to other bacteria, right ankle and foot: Secondary | ICD-10-CM | POA: Diagnosis present

## 2022-05-08 DIAGNOSIS — Z8673 Personal history of transient ischemic attack (TIA), and cerebral infarction without residual deficits: Secondary | ICD-10-CM

## 2022-05-08 DIAGNOSIS — M86171 Other acute osteomyelitis, right ankle and foot: Secondary | ICD-10-CM | POA: Diagnosis present

## 2022-05-08 DIAGNOSIS — L97512 Non-pressure chronic ulcer of other part of right foot with fat layer exposed: Secondary | ICD-10-CM

## 2022-05-08 DIAGNOSIS — Z7982 Long term (current) use of aspirin: Secondary | ICD-10-CM

## 2022-05-08 DIAGNOSIS — E1140 Type 2 diabetes mellitus with diabetic neuropathy, unspecified: Secondary | ICD-10-CM | POA: Diagnosis present

## 2022-05-08 DIAGNOSIS — I10 Essential (primary) hypertension: Secondary | ICD-10-CM | POA: Diagnosis present

## 2022-05-08 DIAGNOSIS — E1169 Type 2 diabetes mellitus with other specified complication: Principal | ICD-10-CM | POA: Diagnosis present

## 2022-05-08 DIAGNOSIS — Z91199 Patient's noncompliance with other medical treatment and regimen due to unspecified reason: Secondary | ICD-10-CM

## 2022-05-08 DIAGNOSIS — M2031 Hallux varus (acquired), right foot: Secondary | ICD-10-CM

## 2022-05-08 DIAGNOSIS — L089 Local infection of the skin and subcutaneous tissue, unspecified: Secondary | ICD-10-CM | POA: Diagnosis present

## 2022-05-08 DIAGNOSIS — E785 Hyperlipidemia, unspecified: Secondary | ICD-10-CM | POA: Diagnosis present

## 2022-05-08 DIAGNOSIS — M8580 Other specified disorders of bone density and structure, unspecified site: Secondary | ICD-10-CM | POA: Diagnosis present

## 2022-05-08 DIAGNOSIS — E11628 Type 2 diabetes mellitus with other skin complications: Secondary | ICD-10-CM | POA: Diagnosis present

## 2022-05-08 DIAGNOSIS — E11621 Type 2 diabetes mellitus with foot ulcer: Secondary | ICD-10-CM | POA: Diagnosis present

## 2022-05-08 DIAGNOSIS — E1121 Type 2 diabetes mellitus with diabetic nephropathy: Secondary | ICD-10-CM | POA: Diagnosis present

## 2022-05-08 DIAGNOSIS — M8618 Other acute osteomyelitis, other site: Principal | ICD-10-CM

## 2022-05-08 DIAGNOSIS — Z833 Family history of diabetes mellitus: Secondary | ICD-10-CM

## 2022-05-08 DIAGNOSIS — L97519 Non-pressure chronic ulcer of other part of right foot with unspecified severity: Secondary | ICD-10-CM | POA: Diagnosis present

## 2022-05-08 DIAGNOSIS — Z794 Long term (current) use of insulin: Secondary | ICD-10-CM

## 2022-05-08 DIAGNOSIS — Z7984 Long term (current) use of oral hypoglycemic drugs: Secondary | ICD-10-CM

## 2022-05-08 DIAGNOSIS — E1165 Type 2 diabetes mellitus with hyperglycemia: Secondary | ICD-10-CM | POA: Diagnosis present

## 2022-05-08 DIAGNOSIS — M869 Osteomyelitis, unspecified: Secondary | ICD-10-CM

## 2022-05-08 LAB — LACTIC ACID, PLASMA: Lactic Acid, Venous: 1.6 mmol/L (ref 0.5–1.9)

## 2022-05-08 LAB — CBC WITH DIFFERENTIAL/PLATELET
Abs Immature Granulocytes: 0.03 10*3/uL (ref 0.00–0.07)
Basophils Absolute: 0.1 10*3/uL (ref 0.0–0.1)
Basophils Relative: 1 %
Eosinophils Absolute: 0.3 10*3/uL (ref 0.0–0.5)
Eosinophils Relative: 2 %
HCT: 37.9 % (ref 36.0–46.0)
Hemoglobin: 12.2 g/dL (ref 12.0–15.0)
Immature Granulocytes: 0 %
Lymphocytes Relative: 12 %
Lymphs Abs: 1.4 10*3/uL (ref 0.7–4.0)
MCH: 27.6 pg (ref 26.0–34.0)
MCHC: 32.2 g/dL (ref 30.0–36.0)
MCV: 85.7 fL (ref 80.0–100.0)
Monocytes Absolute: 0.7 10*3/uL (ref 0.1–1.0)
Monocytes Relative: 6 %
Neutro Abs: 8.7 10*3/uL — ABNORMAL HIGH (ref 1.7–7.7)
Neutrophils Relative %: 79 %
Platelets: 310 10*3/uL (ref 150–400)
RBC: 4.42 MIL/uL (ref 3.87–5.11)
RDW: 15.1 % (ref 11.5–15.5)
WBC: 11.1 10*3/uL — ABNORMAL HIGH (ref 4.0–10.5)
nRBC: 0 % (ref 0.0–0.2)

## 2022-05-08 LAB — BASIC METABOLIC PANEL
Anion gap: 9 (ref 5–15)
BUN: 21 mg/dL (ref 8–23)
CO2: 24 mmol/L (ref 22–32)
Calcium: 9.4 mg/dL (ref 8.9–10.3)
Chloride: 102 mmol/L (ref 98–111)
Creatinine, Ser: 1.21 mg/dL — ABNORMAL HIGH (ref 0.44–1.00)
GFR, Estimated: 49 mL/min — ABNORMAL LOW (ref >60)
Glucose, Bld: 357 mg/dL — ABNORMAL HIGH (ref 70–99)
Potassium: 4.7 mmol/L (ref 3.5–5.1)
Sodium: 135 mmol/L (ref 135–145)

## 2022-05-08 MED ORDER — VANCOMYCIN HCL 1500 MG/300ML IV SOLN
1500.0000 mg | Freq: Once | INTRAVENOUS | Status: AC
  Administered 2022-05-09: 1500 mg via INTRAVENOUS
  Filled 2022-05-08: qty 300

## 2022-05-08 MED ORDER — SODIUM CHLORIDE 0.9 % IV SOLN
2.0000 g | INTRAVENOUS | Status: DC
  Administered 2022-05-09 – 2022-05-10 (×3): 2 g via INTRAVENOUS
  Filled 2022-05-08 (×4): qty 20

## 2022-05-08 MED ORDER — METRONIDAZOLE 500 MG PO TABS
500.0000 mg | ORAL_TABLET | Freq: Two times a day (BID) | ORAL | Status: DC
  Administered 2022-05-09 – 2022-05-11 (×6): 500 mg via ORAL
  Filled 2022-05-08 (×8): qty 1

## 2022-05-08 NOTE — Progress Notes (Signed)
Subjective: Chief Complaint  Patient presents with   Diabetic Ulcer    Right foot diabetic ulcer, hallux, started about 12 days ago,patient went walking with the wrong shoes,  Bleeding and drainage, swelling, rate of pain 3 out of 10, X-rays done today,    66 year old female presents the above concerns.  She states that the swelling is been intermittent to her big toe but recently is gotten worse she has noticed increased swelling and red streaks.  She called the office couple days ago and doxycycline as prescribed but she did not pick this out. And was also prescribed but she did not start this.  She been wearing her daughter-in-law's surgical shoe.  She denies some chills but no fevers.  No chest pain or shortness of breath.  No other concerns.   Last A1c was 9.8 on 10/02/2021  Objective: AAO x3, NAD-presents wearing regular shoe DP/PT pulses palpable bilaterally, CRT less than 3 seconds Sensation decreased with Semmes Weinstein monofilament. There is an ulcer present distal aspect of the hallux which probes close to bone.  There is edema and erythema present to the hallux and there is streaking to the dorsal aspect of the foot to the ankle joint.  There is no drainage or pus.  No fluctuance or crepitation. No pain with calf compression, swelling, warmth, erythema   Assessment: Osteomyelitis hallux, cellulitis  Plan: -All treatment options discussed with the patient including all alternatives, risks, complications.  -X-rays were obtained reviewed.  3 views of the foot were obtained.  There is cortical changes at the distal phalanx distally consistent with osteomyelitis. -Given the concern for osteomyelitis as well as the cellulitis with streaking I recommended to go to the hospital for further evaluation and admission to the hospital for likely IV antibiotics and possible to medication.  Recommend MRI upon admission.  I will discuss with Dr. Loel Lofty who is covering for our group as  well.  We have contacted the [facility] long emergency room clinical note of her arrival. We spoke with  Patty in the ER.   [name] DPM

## 2022-05-08 NOTE — ED Notes (Signed)
Clicked off 2nd lactic in Triage.

## 2022-05-08 NOTE — ED Provider Notes (Signed)
[facility] DEPT Provider Note   CSN: [id number] Arrival date & time: 05/08/22  1333     History {Add pertinent medical, surgical, social history, OB history to HPI:1} Chief Complaint  Patient presents with   Wound Check   Calf Pain    PINKIE MANGER is a 66 y.o. female   HPI     Home Medications Prior to Admission medications   Medication Sig Start Date End Date Taking? Authorizing Provider  aspirin 81 MG EC tablet Take 81 mg by mouth daily.   Yes [provider]  atorvastatin (LIPITOR) 10 MG tablet TAKE 1 TABLET BY MOUTH ONCE DAILY AT 6:00 PM Patient taking differently: Take 10 mg by mouth daily. 10/02/21  Yes Cook, Jayce G, DO  Cyanocobalamin (B-12 PO) Take 2 tablets by mouth daily.    Yes [provider]  insulin glargine (LANTUS) 100 UNIT/ML Solostar Pen Inject 60 Units into the skin at bedtime.   Yes [provider]  losartan (COZAAR) 100 MG tablet Take 100 mg by mouth daily. 05/07/22  Yes [provider]  metFORMIN (GLUCOPHAGE-XR) 500 MG 24 hr tablet Take 500 mg by mouth daily with breakfast. 11/28/20  Yes [provider]  Multiple Vitamins-Minerals (ONE-A-DAY WOMENS VITACRAVES) CHEW Chew 1 tablet by mouth daily.   Yes [provider]  doxycycline (VIBRA-TABS) 100 MG tablet Take 1 tablet (100 mg total) by mouth 2 (two) times daily. Patient not taking: Reported on 02/28/2022 01/19/22   [name], DPM  Insulin Pen Needle (RELION PEN NEEDLES) 32G X 4 MM MISC USE ONE  ONCE DAILY    [provider]  mupirocin ointment (BACTROBAN) 2 % Apply 1 Application topically 2 (two) times daily. 05/07/22   [name], DPM      Allergies    Ace inhibitors, Glipizide, Glyburide, Liraglutide, and Meclizine hcl    Review of Systems   Review of Systems  Physical Exam Updated Vital Signs BP (!) 178/87   Pulse 69   Temp 98.4 F (36.9 C) (Oral)   Resp 18   SpO2 97%  Physical  Exam  ED Results / Procedures / Treatments   Labs (all labs ordered are listed, but only abnormal results are displayed) Labs Reviewed  CBC WITH DIFFERENTIAL/PLATELET - Abnormal; Notable for the following components:      Result Value   WBC 11.1 (*)    Neutro Abs 8.7 (*)    All other components within normal limits  BASIC METABOLIC PANEL - Abnormal; Notable for the following components:   Glucose, Bld 357 (*)    Creatinine, Ser 1.21 (*)    GFR, Estimated 49 (*)    All other components within normal limits  CULTURE, BLOOD (ROUTINE X 2)  CULTURE, BLOOD (ROUTINE X 2)  LACTIC ACID, PLASMA  LACTIC ACID, PLASMA  SEDIMENTATION RATE  C-REACTIVE PROTEIN    EKG None  Radiology MR FOOT RIGHT WO CONTRAST  Result Date: 05/08/2022 CLINICAL DATA:  Osteomyelitis suspected EXAM: MRI OF THE RIGHT FOREFOOT WITHOUT CONTRAST TECHNIQUE: Multiplanar, multisequence MR imaging of the right forefoot was performed. No intravenous contrast was administered. COMPARISON:  Right foot radiographs 05/08/2022 and 02/09/2022 FINDINGS: Bones/Joint/Cartilage There is marrow edema and confluent low T1 signal within the great toe distal phalanx with bony destruction of the phalangeal tuft. Trace great toe IP joint effusion. No other significant marrow signal alteration. Ligaments Intact MTP collateral ligaments.  No evidence of plantar plate tear. Muscles and Tendons Diffuse intramuscular  edema and atrophy in the foot as is commonly seen in diabetics. No acute tendon tear. Soft tissues Diffuse soft tissue swelling of the foot most prominent dorsally and along the great toe. There is a great toe ulcer along the distal aspect. IMPRESSION: Distal great toe ulcer with underlying osteomyelitis of the distal phalanx. Trace great toe IP joint effusion which may suggest developing septic arthritis, though there is no significant marrow signal abnormality in the adjacent proximal phalanx at this time. Diffuse soft tissue swelling of  the foot. No evidence of soft tissue abscess. Electronically Signed   By: Caprice Renshaw M.D.   On: 05/08/2022 20:48    Procedures Procedures  {Document cardiac monitor, telemetry assessment procedure when appropriate:1}  Medications Ordered in ED Medications - No data to display  ED Course/ Medical Decision Making/ A&P                           Medical Decision Making  ***  {Document critical care time when appropriate:1} {Document review of labs and clinical decision tools ie heart score, Chads2Vasc2 etc:1}  {Document your independent review of radiology images, and any outside records:1} {Document your discussion with family members, caretakers, and with consultants:1} {Document social determinants of health affecting pt's care:1} {Document your decision making why or why not admission, treatments were needed:1} Final Clinical Impression(s) / ED Diagnoses Final diagnoses:  None    Rx / DC Orders ED Discharge Orders     None

## 2022-05-08 NOTE — ED Triage Notes (Signed)
Pt states that she has been having ongoing issues with her R greater toe with fractures, and wounds to area, seen at [facility]. Pt stating that the wound has recently been worsening, now she has "red streaks" to her foot and calf pain. Pt denies any CP/SOB, no recent fevers. Pt has hx of T2DM. Not currently on abx.

## 2022-05-08 NOTE — ED Provider Triage Note (Signed)
Emergency Medicine Provider Triage Evaluation Note  [name] [name] , a 66 y.o. female  was evaluated in triage.  Pt complains of foot infection. Sent by podiatry. See note below.  "Plan: -All treatment options discussed with the patient including all alternatives, risks, complications.  -X-rays were obtained reviewed.  3 views of the foot were obtained.  There is cortical changes at the distal phalanx distally consistent with osteomyelitis. -Given the concern for osteomyelitis as well as the cellulitis with streaking I recommended to go to the hospital for further evaluation and admission to the hospital for likely IV antibiotics and possible to medication.  Recommend MRI upon admission.  I will discuss with Dr. Loel Lofty who is covering for our group as well.  We have contacted the [facility] long emergency room clinical note of her arrival. We spoke with  Patty in the ER."     Review of Systems  Positive: wound Negative: fever  Physical Exam  BP (!) 183/80 (BP Location: Left Arm)   Pulse 85   Temp 98.3 F (36.8 C) (Oral)   Resp 18   SpO2 92%  Gen:   Awake, no distress   Resp:  Normal effort  MSK:   Moves extremities without difficulty  Other:    Medical Decision Making  Medically screening exam initiated at 2:26 PM.  Appropriate orders placed.  [name] [name] was informed that the remainder of the evaluation will be completed by another provider, this initial triage assessment does not replace that evaluation, and the importance of remaining in the ED until their evaluation is complete.  Labs MRI   Karie Kirks 05/08/22 1432

## 2022-05-08 NOTE — Telephone Encounter (Signed)
Patient is being seen today

## 2022-05-08 NOTE — Consult Note (Addendum)
PODIATRY CONSULTATION  NAME [name] [name] MRN [id number] DOB 26-May-1956 DOA 05/08/2022   Reason for consult:  Chief Complaint  Patient presents with   Wound Check   Calf Pain    Attending/Consulting physician: Althia Forts as of consultation  History of present illness: 66 y.o. female with history of DM2 with neuropathy. Was seen by Dr. Jacqualyn Posey earlier today for right hallux ulceration distal tip of the toe.  She has noticed increasing swelling as well as some red streaks.  She called office previously and was prescribed doxycycline but never picked it up.  X-rays were taken in the clinic earlier today and there is concern for osteomyelitis in the distal phalanx of the right hallux so the patient was referred to the emergency department for further work-up and possible amputation of the right great toe.    Past Medical History:  Diagnosis Date   CVA (cerebral vascular accident) ([place]) 02/24/2018   Diabetes ([place]) 02/24/2018   Hypertension    Microproteinuria    Neuropathy    Noncompliance    Osteopenia    Stroke [facility])    residual "balance issues"   Type 2 diabetes mellitus (HCC)    Vertigo        Latest Ref Rng & Units 05/08/2022    2:50 PM 10/02/2021    3:30 PM 05/12/2020   12:15 PM  CBC  WBC 4.0 - 10.5 K/uL 11.1  9.4  7.3   Hemoglobin 12.0 - 15.0 g/dL 12.2  12.5  12.5   Hematocrit 36.0 - 46.0 % 37.9  39.0  38.1   Platelets 150 - 400 K/uL 310  340  283        Latest Ref Rng & Units 05/08/2022    2:50 PM 10/02/2021    3:30 PM 05/12/2020   12:15 PM  BMP  Glucose 70 - 99 mg/dL 357  200  269   BUN 8 - 23 mg/dL 21  14  18    Creatinine 0.44 - 1.00 mg/dL 1.21  1.11  1.08   BUN/Creat Ratio 12 - 28  13  17    Sodium 135 - 145 mmol/L 135  139  136   Potassium 3.5 - 5.1 mmol/L 4.7  5.0  4.6   Chloride 98 - 111 mmol/L 102  102  98   CO2 22 - 32 mmol/L 24  24  24    Calcium 8.9 - 10.3 mg/dL 9.4  10.2  9.3       Physical Exam: Lower Extremity Exam Vasc: R - PT palpable, DP  palpable. Cap refill < 3 sec to digits  L - PT palpable, DP palpable. Cap refill <3 sec to digits  Derm: R - There is an ulcer present distal aspect of the hallux which probes close to bone.  There is edema and erythema present to the hallux and there is streaking to the dorsal aspect of the foot to the ankle joint. No drainage from wound. No fluctuance or crepitus appreciated.  L - Normal temp/texture/turgor with no open lesion or clinical signs of infection  MSK:  R - Mallet toe deformity of the right hallux  L -  No gross deformities. Compartments soft, non-tender, compressible  Neuro: R - Gross sensation diminished. Gross motor function intact   L - Gross sensation diminished. Gross motor function intact  XR Right foot 9/26 Osteolysis and radiolucency of the distal aspect of the distal phalanx of the right hallux consistent with osteomyelitis  ASSESSMENT/PLAN OF CARE 66 y.o.  female with PMHx significant for  DM type 2 with neuropathy with Ulceration present to the distal tuft L hallux with XR concerning for osteomyelitis of the right hallux distal phalanx.   - NPO past 5 am tomorrow for partial right hallux amputation tmrw afternoon/evening - MRI R foot ordered, hopefull it can be completed overnight or tmrw am for surgical planning - ESR and CRP ordered - Continue IV abx broad spectrum pending further culture data - Anticoagulation: Hold pending OR tomorrow  - Wound care: leave dressing applied from clinic - WB status: Weightbearing as tolerated in post op shoe - Will continue to follow   Thank you for the consult.  Please contact me directly with any questions or concerns.           Everitt Amber, DPM [facility]    [address], [place] [address]                Office [phone]  Fax [phone]

## 2022-05-09 ENCOUNTER — Encounter (HOSPITAL_COMMUNITY): Payer: Self-pay | Admitting: Internal Medicine

## 2022-05-09 ENCOUNTER — Inpatient Hospital Stay (HOSPITAL_COMMUNITY): Payer: BC Managed Care – PPO

## 2022-05-09 ENCOUNTER — Other Ambulatory Visit: Payer: Self-pay

## 2022-05-09 ENCOUNTER — Observation Stay (HOSPITAL_COMMUNITY): Payer: BC Managed Care – PPO | Admitting: Certified Registered"

## 2022-05-09 ENCOUNTER — Encounter (HOSPITAL_COMMUNITY)
Admission: EM | Disposition: A | Discharge status: Home / Self Care | Payer: Self-pay | Source: Home / Self Care | Attending: Internal Medicine

## 2022-05-09 DIAGNOSIS — M86171 Other acute osteomyelitis, right ankle and foot: Secondary | ICD-10-CM

## 2022-05-09 DIAGNOSIS — E11628 Type 2 diabetes mellitus with other skin complications: Secondary | ICD-10-CM | POA: Diagnosis present

## 2022-05-09 DIAGNOSIS — E7801 Familial hypercholesterolemia: Secondary | ICD-10-CM

## 2022-05-09 DIAGNOSIS — E1140 Type 2 diabetes mellitus with diabetic neuropathy, unspecified: Secondary | ICD-10-CM | POA: Diagnosis present

## 2022-05-09 DIAGNOSIS — M8580 Other specified disorders of bone density and structure, unspecified site: Secondary | ICD-10-CM | POA: Diagnosis present

## 2022-05-09 DIAGNOSIS — Z794 Long term (current) use of insulin: Secondary | ICD-10-CM | POA: Diagnosis not present

## 2022-05-09 DIAGNOSIS — Z7982 Long term (current) use of aspirin: Secondary | ICD-10-CM | POA: Diagnosis not present

## 2022-05-09 DIAGNOSIS — E1165 Type 2 diabetes mellitus with hyperglycemia: Secondary | ICD-10-CM

## 2022-05-09 DIAGNOSIS — E11621 Type 2 diabetes mellitus with foot ulcer: Secondary | ICD-10-CM | POA: Diagnosis present

## 2022-05-09 DIAGNOSIS — M869 Osteomyelitis, unspecified: Secondary | ICD-10-CM

## 2022-05-09 DIAGNOSIS — L089 Local infection of the skin and subcutaneous tissue, unspecified: Secondary | ICD-10-CM

## 2022-05-09 DIAGNOSIS — Z91199 Patient's noncompliance with other medical treatment and regimen due to unspecified reason: Secondary | ICD-10-CM | POA: Diagnosis not present

## 2022-05-09 DIAGNOSIS — E1121 Type 2 diabetes mellitus with diabetic nephropathy: Secondary | ICD-10-CM | POA: Diagnosis present

## 2022-05-09 DIAGNOSIS — E7800 Pure hypercholesterolemia, unspecified: Secondary | ICD-10-CM | POA: Diagnosis not present

## 2022-05-09 DIAGNOSIS — Z8673 Personal history of transient ischemic attack (TIA), and cerebral infarction without residual deficits: Secondary | ICD-10-CM | POA: Diagnosis not present

## 2022-05-09 DIAGNOSIS — I10 Essential (primary) hypertension: Secondary | ICD-10-CM

## 2022-05-09 DIAGNOSIS — Z888 Allergy status to other drugs, medicaments and biological substances status: Secondary | ICD-10-CM | POA: Diagnosis not present

## 2022-05-09 DIAGNOSIS — E1169 Type 2 diabetes mellitus with other specified complication: Secondary | ICD-10-CM | POA: Diagnosis present

## 2022-05-09 DIAGNOSIS — L97519 Non-pressure chronic ulcer of other part of right foot with unspecified severity: Secondary | ICD-10-CM | POA: Diagnosis present

## 2022-05-09 DIAGNOSIS — Z833 Family history of diabetes mellitus: Secondary | ICD-10-CM | POA: Diagnosis not present

## 2022-05-09 DIAGNOSIS — M00871 Arthritis due to other bacteria, right ankle and foot: Secondary | ICD-10-CM | POA: Diagnosis present

## 2022-05-09 DIAGNOSIS — E785 Hyperlipidemia, unspecified: Secondary | ICD-10-CM | POA: Diagnosis present

## 2022-05-09 DIAGNOSIS — Z803 Family history of malignant neoplasm of breast: Secondary | ICD-10-CM | POA: Diagnosis not present

## 2022-05-09 DIAGNOSIS — M8618 Other acute osteomyelitis, other site: Secondary | ICD-10-CM | POA: Diagnosis not present

## 2022-05-09 DIAGNOSIS — Z7984 Long term (current) use of oral hypoglycemic drugs: Secondary | ICD-10-CM | POA: Diagnosis not present

## 2022-05-09 HISTORY — PX: AMPUTATION: SHX166

## 2022-05-09 LAB — CBC
HCT: 40.1 % (ref 36.0–46.0)
Hemoglobin: 12.7 g/dL (ref 12.0–15.0)
MCH: 27.4 pg (ref 26.0–34.0)
MCHC: 31.7 g/dL (ref 30.0–36.0)
MCV: 86.4 fL (ref 80.0–100.0)
Platelets: 330 10*3/uL (ref 150–400)
RBC: 4.64 MIL/uL (ref 3.87–5.11)
RDW: 15.2 % (ref 11.5–15.5)
WBC: 10.9 10*3/uL — ABNORMAL HIGH (ref 4.0–10.5)
nRBC: 0 % (ref 0.0–0.2)

## 2022-05-09 LAB — GLUCOSE, CAPILLARY
Glucose-Capillary: 134 mg/dL — ABNORMAL HIGH (ref 70–99)
Glucose-Capillary: 136 mg/dL — ABNORMAL HIGH (ref 70–99)
Glucose-Capillary: 397 mg/dL — ABNORMAL HIGH (ref 70–99)
Glucose-Capillary: 412 mg/dL — ABNORMAL HIGH (ref 70–99)
Glucose-Capillary: 419 mg/dL — ABNORMAL HIGH (ref 70–99)
Glucose-Capillary: 433 mg/dL — ABNORMAL HIGH (ref 70–99)
Glucose-Capillary: 444 mg/dL — ABNORMAL HIGH (ref 70–99)
Glucose-Capillary: 450 mg/dL — ABNORMAL HIGH (ref 70–99)
Glucose-Capillary: 474 mg/dL — ABNORMAL HIGH (ref 70–99)

## 2022-05-09 LAB — BASIC METABOLIC PANEL
Anion gap: 10 (ref 5–15)
BUN: 23 mg/dL (ref 8–23)
CO2: 26 mmol/L (ref 22–32)
Calcium: 9.6 mg/dL (ref 8.9–10.3)
Chloride: 105 mmol/L (ref 98–111)
Creatinine, Ser: 1.1 mg/dL — ABNORMAL HIGH (ref 0.44–1.00)
GFR, Estimated: 55 mL/min — ABNORMAL LOW (ref >60)
Glucose, Bld: 144 mg/dL — ABNORMAL HIGH (ref 70–99)
Potassium: 3.6 mmol/L (ref 3.5–5.1)
Sodium: 141 mmol/L (ref 135–145)

## 2022-05-09 LAB — SEDIMENTATION RATE: Sed Rate: 30 mm/hr — ABNORMAL HIGH (ref 0–22)

## 2022-05-09 LAB — CBG MONITORING, ED
Glucose-Capillary: 263 mg/dL — ABNORMAL HIGH (ref 70–99)
Glucose-Capillary: 184 mg/dL — ABNORMAL HIGH (ref 70–99)
Glucose-Capillary: 145 mg/dL — ABNORMAL HIGH (ref 70–99)

## 2022-05-09 LAB — TYPE AND SCREEN
ABO/RH(D): A POS
Antibody Screen: NEGATIVE

## 2022-05-09 LAB — HIV ANTIBODY (ROUTINE TESTING W REFLEX): HIV Screen 4th Generation wRfx: NONREACTIVE

## 2022-05-09 LAB — C-REACTIVE PROTEIN: CRP: 4.1 mg/dL — ABNORMAL HIGH (ref <1.0)

## 2022-05-09 LAB — LACTIC ACID, PLASMA: Lactic Acid, Venous: 1.1 mmol/L (ref 0.5–1.9)

## 2022-05-09 SURGERY — AMPUTATION, TOE
Anesthesia: Choice | Site: Toe | Laterality: Right

## 2022-05-09 SURGERY — AMPUTATION DIGIT
Anesthesia: Monitor Anesthesia Care | Laterality: Right

## 2022-05-09 MED ORDER — LIDOCAINE HCL (PF) 1 % IJ SOLN
INTRAMUSCULAR | Status: DC | PRN
  Administered 2022-05-09: 5 mL

## 2022-05-09 MED ORDER — SODIUM CHLORIDE 0.9% FLUSH
3.0000 mL | Freq: Two times a day (BID) | INTRAVENOUS | Status: DC
  Administered 2022-05-09 – 2022-05-11 (×4): 3 mL via INTRAVENOUS

## 2022-05-09 MED ORDER — ONDANSETRON HCL 4 MG/2ML IJ SOLN
INTRAMUSCULAR | Status: AC
  Filled 2022-05-09: qty 2

## 2022-05-09 MED ORDER — PROPOFOL 1000 MG/100ML IV EMUL
INTRAVENOUS | Status: AC
  Filled 2022-05-09: qty 100

## 2022-05-09 MED ORDER — POLYETHYLENE GLYCOL 3350 17 G PO PACK: 17.0000 g | PACK | Freq: Every day | ORAL | Status: DC | PRN

## 2022-05-09 MED ORDER — FENTANYL CITRATE (PF) 100 MCG/2ML IJ SOLN
INTRAMUSCULAR | Status: AC
  Filled 2022-05-09: qty 2

## 2022-05-09 MED ORDER — PHENYLEPHRINE HCL-NACL 20-0.9 MG/250ML-% IV SOLN
INTRAVENOUS | Status: AC
  Filled 2022-05-09: qty 250

## 2022-05-09 MED ORDER — LACTATED RINGERS IV SOLN: INTRAVENOUS | Status: DC

## 2022-05-09 MED ORDER — 0.9 % SODIUM CHLORIDE (POUR BTL) OPTIME
TOPICAL | Status: DC | PRN
  Administered 2022-05-09: 500 mL

## 2022-05-09 MED ORDER — DEXAMETHASONE SODIUM PHOSPHATE 10 MG/ML IJ SOLN
INTRAMUSCULAR | Status: AC
  Filled 2022-05-09: qty 1

## 2022-05-09 MED ORDER — BUPIVACAINE HCL 0.25 % IJ SOLN
INTRAMUSCULAR | Status: DC | PRN
  Administered 2022-05-09: 5 mL

## 2022-05-09 MED ORDER — ONDANSETRON HCL 4 MG/2ML IJ SOLN
INTRAMUSCULAR | Status: DC | PRN
  Administered 2022-05-09: 4 mg via INTRAVENOUS

## 2022-05-09 MED ORDER — INSULIN ASPART 100 UNIT/ML IJ SOLN
16.0000 [IU] | Freq: Once | INTRAMUSCULAR | Status: AC
  Administered 2022-05-09: 16 [IU] via SUBCUTANEOUS

## 2022-05-09 MED ORDER — CHLORHEXIDINE GLUCONATE 0.12 % MT SOLN
15.0000 mL | Freq: Once | OROMUCOSAL | Status: AC
  Administered 2022-05-09: 15 mL via OROMUCOSAL

## 2022-05-09 MED ORDER — PROPOFOL 500 MG/50ML IV EMUL
INTRAVENOUS | Status: DC | PRN
  Administered 2022-05-09: 30 ug/kg/min via INTRAVENOUS

## 2022-05-09 MED ORDER — ACETAMINOPHEN 650 MG RE SUPP: 650.0000 mg | Freq: Four times a day (QID) | RECTAL | Status: DC | PRN

## 2022-05-09 MED ORDER — MIDAZOLAM HCL 5 MG/5ML IJ SOLN
INTRAMUSCULAR | Status: DC | PRN
  Administered 2022-05-09: 2 mg via INTRAVENOUS

## 2022-05-09 MED ORDER — INSULIN GLARGINE-YFGN 100 UNIT/ML ~~LOC~~ SOLN
20.0000 [IU] | Freq: Every day | SUBCUTANEOUS | Status: DC
  Administered 2022-05-09 – 2022-05-10 (×3): 20 [IU] via SUBCUTANEOUS
  Filled 2022-05-09 (×4): qty 0.2

## 2022-05-09 MED ORDER — VANCOMYCIN HCL 750 MG/150ML IV SOLN
750.0000 mg | INTRAVENOUS | Status: DC
  Administered 2022-05-10: 750 mg via INTRAVENOUS
  Filled 2022-05-09: qty 150

## 2022-05-09 MED ORDER — FENTANYL CITRATE (PF) 100 MCG/2ML IJ SOLN
INTRAMUSCULAR | Status: DC | PRN
  Administered 2022-05-09 (×2): 50 ug via INTRAVENOUS

## 2022-05-09 MED ORDER — MIDAZOLAM HCL 2 MG/2ML IJ SOLN
INTRAMUSCULAR | Status: AC
  Filled 2022-05-09: qty 2

## 2022-05-09 MED ORDER — OXYCODONE-ACETAMINOPHEN 5-325 MG PO TABS
1.0000 | ORAL_TABLET | ORAL | Status: DC | PRN
  Administered 2022-05-09 – 2022-05-11 (×3): 1 via ORAL
  Filled 2022-05-09 (×4): qty 1

## 2022-05-09 MED ORDER — DEXAMETHASONE SODIUM PHOSPHATE 10 MG/ML IJ SOLN
INTRAMUSCULAR | Status: DC | PRN
  Administered 2022-05-09: 8 mg via INTRAVENOUS

## 2022-05-09 MED ORDER — ATORVASTATIN CALCIUM 10 MG PO TABS
10.0000 mg | ORAL_TABLET | Freq: Every evening | ORAL | Status: DC
  Administered 2022-05-09 – 2022-05-10 (×2): 10 mg via ORAL
  Filled 2022-05-09 (×2): qty 1

## 2022-05-09 MED ORDER — BUPIVACAINE HCL (PF) 0.25 % IJ SOLN
INTRAMUSCULAR | Status: AC
  Filled 2022-05-09: qty 30

## 2022-05-09 MED ORDER — INSULIN ASPART 100 UNIT/ML IJ SOLN
0.0000 [IU] | INTRAMUSCULAR | Status: DC
  Administered 2022-05-09: 3 [IU] via SUBCUTANEOUS
  Administered 2022-05-09 (×2): 2 [IU] via SUBCUTANEOUS
  Administered 2022-05-09: 8 [IU] via SUBCUTANEOUS
  Administered 2022-05-10 (×2): 5 [IU] via SUBCUTANEOUS
  Administered 2022-05-10: 8 [IU] via SUBCUTANEOUS
  Administered 2022-05-10: 3 [IU] via SUBCUTANEOUS
  Administered 2022-05-10: 5 [IU] via SUBCUTANEOUS
  Administered 2022-05-11: 2 [IU] via SUBCUTANEOUS
  Administered 2022-05-11: 3 [IU] via SUBCUTANEOUS
  Filled 2022-05-09: qty 0.15

## 2022-05-09 MED ORDER — ACETAMINOPHEN 325 MG PO TABS
650.0000 mg | ORAL_TABLET | Freq: Four times a day (QID) | ORAL | Status: DC | PRN
  Administered 2022-05-09: 650 mg via ORAL
  Filled 2022-05-09: qty 2

## 2022-05-09 MED ORDER — LIDOCAINE HCL 1 % IJ SOLN
INTRAMUSCULAR | Status: AC
  Filled 2022-05-09: qty 20

## 2022-05-09 MED ORDER — FENTANYL CITRATE PF 50 MCG/ML IJ SOSY: 25.0000 ug | PREFILLED_SYRINGE | INTRAMUSCULAR | Status: DC | PRN

## 2022-05-09 MED ORDER — LOSARTAN POTASSIUM 50 MG PO TABS
100.0000 mg | ORAL_TABLET | Freq: Every day | ORAL | Status: DC
  Administered 2022-05-09 – 2022-05-11 (×3): 100 mg via ORAL
  Filled 2022-05-09: qty 4
  Filled 2022-05-09 (×2): qty 2

## 2022-05-09 SURGICAL SUPPLY — 61 items
APL PRP STRL LF DISP 70% ISPRP (MISCELLANEOUS) ×1
BAG COUNTER SPONGE SURGICOUNT (BAG) ×2 IMPLANT
BAG SPNG CNTER NS LX DISP (BAG)
BLADE SURG 15 STRL LF DISP TIS (BLADE) ×2 IMPLANT
BLADE SURG 15 STRL SS (BLADE) ×1
BNDG CMPR 9X4 STRL LF SNTH (GAUZE/BANDAGES/DRESSINGS)
BNDG ELASTIC 3X5.8 VLCR STR LF (GAUZE/BANDAGES/DRESSINGS) ×2 IMPLANT
BNDG ELASTIC 4X5.8 VLCR STR LF (GAUZE/BANDAGES/DRESSINGS) ×2 IMPLANT
BNDG ESMARK 4X9 LF (GAUZE/BANDAGES/DRESSINGS) IMPLANT
BNDG GAUZE DERMACEA FLUFF 4 (GAUZE/BANDAGES/DRESSINGS) ×2 IMPLANT
BNDG GZE DERMACEA 4 6PLY (GAUZE/BANDAGES/DRESSINGS) ×1
CHLORAPREP W/TINT 26 (MISCELLANEOUS) IMPLANT
CNTNR URN SCR LID CUP LEK RST (MISCELLANEOUS) IMPLANT
CONT SPEC 4OZ STRL OR WHT (MISCELLANEOUS) ×2
COVER BACK TABLE 60X90IN (DRAPES) ×2 IMPLANT
CUFF TOURN SGL QUICK 18X4 (TOURNIQUET CUFF) IMPLANT
CUFF TOURN SGL QUICK 24 (TOURNIQUET CUFF)
CUFF TRNQT CYL 24X4X16.5-23 (TOURNIQUET CUFF) IMPLANT
DRAPE 3/4 80X56 (DRAPES) ×2 IMPLANT
DRAPE EXTREMITY T 121X128X90 (DISPOSABLE) ×2 IMPLANT
DRAPE IMP U-DRAPE 54X76 (DRAPES) IMPLANT
DRAPE SHEET LG 3/4 BI-LAMINATE (DRAPES) ×2 IMPLANT
DRAPE U-SHAPE 47X51 STRL (DRAPES) ×2 IMPLANT
DRSG EMULSION OIL 3X3 NADH (GAUZE/BANDAGES/DRESSINGS) IMPLANT
ELECT REM PT RETURN 15FT ADLT (MISCELLANEOUS) ×2 IMPLANT
GAUZE 4X4 16PLY ~~LOC~~+RFID DBL (SPONGE) IMPLANT
GAUZE SPONGE 4X4 12PLY STRL (GAUZE/BANDAGES/DRESSINGS) ×2 IMPLANT
GAUZE XEROFORM 1X8 LF (GAUZE/BANDAGES/DRESSINGS) IMPLANT
GLOVE BIO SURGEON STRL SZ7.5 (GLOVE) ×2 IMPLANT
GLOVE BIOGEL PI IND STRL 8 (GLOVE) ×2 IMPLANT
GOWN STRL REUS W/ TWL XL LVL3 (GOWN DISPOSABLE) ×2 IMPLANT
GOWN STRL REUS W/TWL XL LVL3 (GOWN DISPOSABLE) ×1
KIT BASIN OR (CUSTOM PROCEDURE TRAY) ×2 IMPLANT
KIT TURNOVER KIT A (KITS) IMPLANT
MANIFOLD NEPTUNE II (INSTRUMENTS) ×2 IMPLANT
NDL HYPO 25X1 1.5 SAFETY (NEEDLE) ×2 IMPLANT
NDL SAFETY ECLIP 18X1.5 (MISCELLANEOUS) IMPLANT
NEEDLE HYPO 25X1 1.5 SAFETY (NEEDLE) ×2 IMPLANT
NS IRRIG 1000ML POUR BTL (IV SOLUTION) IMPLANT
PADDING CAST ABS COTTON 4X4 ST (CAST SUPPLIES) ×2 IMPLANT
SET IRRIG Y TYPE TUR BLADDER L (SET/KITS/TRAYS/PACK) IMPLANT
SPONGE T-LAP 4X18 ~~LOC~~+RFID (SPONGE) ×2 IMPLANT
STAPLER VISISTAT 35W (STAPLE) IMPLANT
STOCKINETTE 6  STRL (DRAPES) ×1
STOCKINETTE 6 STRL (DRAPES) ×2 IMPLANT
SUCTION FRAZIER HANDLE 10FR (MISCELLANEOUS) ×1
SUCTION TUBE FRAZIER 10FR DISP (MISCELLANEOUS) IMPLANT
SUT ETHILON 3 0 PS 1 (SUTURE) IMPLANT
SUT ETHILON 4 0 PS 2 18 (SUTURE) IMPLANT
SUT MNCRL AB 3-0 PS2 18 (SUTURE) IMPLANT
SUT MNCRL AB 4-0 PS2 18 (SUTURE) IMPLANT
SUT PROLENE 4 0 PS 2 18 (SUTURE) IMPLANT
SUT VIC AB 2-0 SH 27 (SUTURE)
SUT VIC AB 2-0 SH 27XBRD (SUTURE) IMPLANT
SYR 10ML LL (SYRINGE) IMPLANT
SYR BULB EAR ULCER 3OZ GRN STR (SYRINGE) ×2 IMPLANT
SYR CONTROL 10ML LL (SYRINGE) ×2 IMPLANT
TUBE IRRIGATION SET MISONIX (TUBING) IMPLANT
TUBING CONNECTING 10 (TUBING) IMPLANT
UNDERPAD 30X36 HEAVY ABSORB (UNDERPADS AND DIAPERS) ×2 IMPLANT
YANKAUER SUCT BULB TIP NO VENT (SUCTIONS) ×2 IMPLANT

## 2022-05-09 NOTE — H&P (Signed)
History and Physical   [name] [name] WPY:[id number] DOB: 25-Dec-1955 DOA: 05/08/2022  PCP: Coral Spikes, DO   Patient coming from: Home  Chief Complaint: Diabetic Foot Infection  HPI: [name] [name] is a 65 y.o. female with medical history significant of hypertension, hyperlipidemia, diabetes, neuropathy, stroke, history of osteomyelitis presenting with diabetic foot wound.  Patient has known history of diabetic foot wound and follows with podiatry outpatient.  She was seen in the office earlier today and sent to the ED due to concern for developing osteomyelitis of the distal great toe.  She was told plan is for IV antibiotics and possible amputation.  She denies fevers, chills, chest pain, shortness of breath, abdominal pain, constipation, diarrhea, nausea, vomiting.  No significant pain of the toe due to her neuropathy.  ED Course: Vital signs in the ED significant for blood pressure in the 053Z to 767H systolic.  Lab work-up included BMP with creatinine of 1.21 near baseline of 1.1, glucose 357.  CBC with leukocytosis to 11.1.  Lactic acid normal with repeat pending.  ESR and CRP pending.  Blood cultures pending.  MRI of the right foot did confirm distal great toe ulcer with underlying osteomyelitis and possible early developing septic arthritis.  Also noted was diffuse soft tissue swelling.  Patient received vancomycin, ceftriaxone, Flagyl in the ED.  Podiatry consulted and recommended patient be n.p.o. at 5 AM for amputation in the morning and to continue with broad-spectrum antibiotics for now.  Review of Systems: As per HPI otherwise all other systems reviewed and are negative.  Past Medical History:  Diagnosis Date   CVA (cerebral vascular accident) ([place]) 02/24/2018   Diabetes ([place]) 02/24/2018   Hypertension    Microproteinuria    Neuropathy    Noncompliance    Osteopenia    Stroke ([place])    residual "balance issues"   Type 2 diabetes mellitus ([place])    Vertigo     Past  Surgical History:  Procedure Laterality Date   ABDOMINAL HYSTERECTOMY     BACK SURGERY      Social History  reports that she has never smoked. She has been exposed to tobacco smoke. She has never used smokeless tobacco. She reports that she does not drink alcohol and does not use drugs.  Allergies  Allergen Reactions   Ace Inhibitors Cough   Glipizide    Glyburide Other (See Comments)    Other reaction(s): hypoglycemia and weight gain   Liraglutide     Other reaction(s): stomach upset   Meclizine Hcl     Family History  Problem Relation Age of Onset   Diabetes Mother    Breast cancer Mother    Diabetes Father    Deep vein thrombosis Sister   Reviewed on admission  Prior to Admission medications   Medication Sig Start Date End Date Taking? Authorizing Provider  aspirin 81 MG EC tablet Take 81 mg by mouth daily.   Yes [provider]  atorvastatin (LIPITOR) 10 MG tablet TAKE 1 TABLET BY MOUTH ONCE DAILY AT 6:00 PM Patient taking differently: Take 10 mg by mouth daily. 10/02/21  Yes Cook, Jayce G, DO  Cyanocobalamin (B-12 PO) Take 2 tablets by mouth daily.    Yes [provider]  insulin glargine (LANTUS) 100 UNIT/ML Solostar Pen Inject 60 Units into the skin at bedtime.   Yes [provider]  losartan (COZAAR) 100 MG tablet Take 100 mg by mouth daily. 05/07/22  Yes [provider]  metFORMIN (  GLUCOPHAGE-XR) 500 MG 24 hr tablet Take 500 mg by mouth daily with breakfast. 11/28/20  Yes [provider]  Multiple Vitamins-Minerals (ONE-A-DAY WOMENS VITACRAVES) CHEW Chew 1 tablet by mouth daily.   Yes [provider]  Insulin Pen Needle (RELION PEN NEEDLES) 32G X 4 MM MISC USE ONE  ONCE DAILY    [provider]  mupirocin ointment (BACTROBAN) 2 % Apply 1 Application topically 2 (two) times daily. 05/07/22   [name], DPM    Physical Exam: Vitals:   05/08/22 2230 05/08/22 2318 05/08/22 2343 05/09/22 0000  BP: (!)  178/87   (!) 180/70  Pulse: 69   62  Resp: 18   16  Temp:  98 F (36.7 C)    TempSrc:  Oral    SpO2: 97%   98%  Weight:   74.5 kg     Physical Exam Constitutional:      General: She is not in acute distress.    Appearance: Normal appearance.  HENT:     Head: Normocephalic and atraumatic.     Mouth/Throat:     Mouth: Mucous membranes are moist.     Pharynx: Oropharynx is clear.  Eyes:     Extraocular Movements: Extraocular movements intact.     Pupils: Pupils are equal, round, and reactive to light.  Cardiovascular:     Rate and Rhythm: Normal rate and regular rhythm.     Pulses: Normal pulses.     Heart sounds: Normal heart sounds.  Pulmonary:     Effort: Pulmonary effort is normal. No respiratory distress.     Breath sounds: Normal breath sounds.  Abdominal:     General: Bowel sounds are normal. There is no distension.     Palpations: Abdomen is soft.     Tenderness: There is no abdominal tenderness.  Musculoskeletal:        General: Swelling present. No deformity.     Comments: Right foot with erythema, edema, warmth about the right great toe with ulceration noted.  Skin:    General: Skin is warm and dry.  Neurological:     General: No focal deficit present.     Mental Status: Mental status is at baseline.        Labs on Admission: I have personally reviewed following labs and imaging studies  CBC: Recent Labs  Lab 05/08/22 1450  WBC 11.1*  NEUTROABS 8.7*  HGB 12.2  HCT 37.9  MCV 85.7  PLT 174    Basic Metabolic Panel: Recent Labs  Lab 05/08/22 1450  NA 135  K 4.7  CL 102  CO2 24  GLUCOSE 357*  BUN 21  CREATININE 1.21*  CALCIUM 9.4    GFR: Estimated Creatinine Clearance: 42.1 mL/min (A) (by C-G formula based on SCr of 1.21 mg/dL (H)).  Liver Function Tests: No results for input(s): "AST", "ALT", "ALKPHOS", "BILITOT", "PROT", "ALBUMIN" in the last 168 hours.  Urine analysis:    Component Value Date/Time   COLORURINE YELLOW  04/18/2018 [place] 04/18/2018 0753   LABSPEC 1.025 04/18/2018 0753   PHURINE 5.0 04/18/2018 0753   GLUCOSEU >=500 (A) 04/18/2018 0753   HGBUR NEGATIVE 04/18/2018 0753   BILIRUBINUR NEGATIVE 04/18/2018 0753   KETONESUR 80 (A) 04/18/2018 0753   PROTEINUR Positive (A) 05/14/2018 1411   PROTEINUR 30 (A) 04/18/2018 0753   NITRITE NEGATIVE 04/18/2018 0753   LEUKOCYTESUR Large (3+) (A) 05/14/2018 1411    Radiological Exams on Admission: MR FOOT RIGHT WO CONTRAST  Result Date: 05/08/2022 CLINICAL DATA:  Osteomyelitis suspected EXAM: MRI OF THE RIGHT FOREFOOT WITHOUT CONTRAST TECHNIQUE: Multiplanar, multisequence MR imaging of the right forefoot was performed. No intravenous contrast was administered. COMPARISON:  Right foot radiographs 05/08/2022 and 02/09/2022 FINDINGS: Bones/Joint/Cartilage There is marrow edema and confluent low T1 signal within the great toe distal phalanx with bony destruction of the phalangeal tuft. Trace great toe IP joint effusion. No other significant marrow signal alteration. Ligaments Intact MTP collateral ligaments.  No evidence of plantar plate tear. Muscles and Tendons Diffuse intramuscular edema and atrophy in the foot as is commonly seen in diabetics. No acute tendon tear. Soft tissues Diffuse soft tissue swelling of the foot most prominent dorsally and along the great toe. There is a great toe ulcer along the distal aspect. IMPRESSION: Distal great toe ulcer with underlying osteomyelitis of the distal phalanx. Trace great toe IP joint effusion which may suggest developing septic arthritis, though there is no significant marrow signal abnormality in the adjacent proximal phalanx at this time. Diffuse soft tissue swelling of the foot. No evidence of soft tissue abscess. Electronically Signed   By: Maurine Simmering M.D.   On: 05/08/2022 20:48    EKG: Not performed in the emergency department  Assessment/Plan Principal Problem:   Diabetic foot infection  ([place]) Active Problems:   Essential hypertension   HLD (hyperlipidemia)   History of CVA (cerebrovascular accident)   Polyneuropathy due to type 2 diabetes mellitus ([place])   Uncontrolled type 2 diabetes mellitus with hyperglycemia ([place])   Diabetic foot infection Osteomyelitis > Patient presenting from podiatry due to concern for developing osteomyelitis in setting of known diabetic foot infection. > MRI in the ED did confirm osteomyelitis with possible developing septic arthritis. > Patient noted to have leukocytosis to 11.1 but otherwise stable labs and vital signs. > Podiatry consulted recommending n.p.o. at 5 AM and continue with broad-spectrum antibiotics as started in the ED which include vancomycin, ceftriaxone, Flagyl. - Monitor on MedSurg unit - Appreciate podiatry recommendations - Continue with vancomycin, ceftriaxone, Flagyl - Trend fever curve and WBC - N.p.o. at midnight  Diabetes > On 20 to 60 units nightly at home.  Glucose 357 in the ED in the setting of foot infection. - Continue with 20 units nightly - SSI every 4 hours as patient will be n.p.o. by the time she is due for initial dose.  Hypertension - Continue home losartan  Hyperlipidemia - Continue home atorvastatin  History of CVA - Continue home atorvastatin - Hold home aspirin in the setting of planned surgery  DVT prophylaxis: SCDs for now Code Status:   Full Family Communication:  Updated at bedside Disposition Plan:   Patient is from:  Home  Anticipated DC to:  Home  Anticipated DC date:  1 to 3 days  Anticipated DC barriers: None  Consults called:  Podiatry Admission status:  Observation, MedSurg  Severity of Illness: The appropriate patient status for this patient is OBSERVATION. Observation status is judged to be reasonable and necessary in order to provide the required intensity of service to ensure the patient's safety. The patient's presenting symptoms, physical exam findings, and initial  radiographic and laboratory data in the context of their medical condition is felt to place them at decreased risk for further clinical deterioration. Furthermore, it is anticipated that the patient will be medically stable for discharge from the hospital within 2 midnights of admission.    Marcelyn Bruins MD [facility]  How to contact the [facility]  Attending or Consulting provider [place] or covering provider during after hours [place], for this patient?   Check the care team in [facility] and look for a) attending/consulting [facility] provider listed and b) the [facility] team listed Log into [email or web address] and use [facility]'s universal password to access. If you do not have the password, please contact the hospital operator. Locate the [facility] provider you are looking for under [facility] and page to a number that you can be directly reached. If you still have difficulty reaching the provider, please page the [facility] (Director on Call) for the Hospitalists listed on amion for assistance.  05/09/2022, 12:40 AM

## 2022-05-09 NOTE — ED Notes (Signed)
Pt up to BR, steady gait noted

## 2022-05-09 NOTE — Plan of Care (Signed)
?  Problem: Activity: ?Goal: Risk for activity intolerance will decrease ?Outcome: Progressing ?  ?Problem: Safety: ?Goal: Ability to remain free from injury will improve ?Outcome: Progressing ?  ?Problem: Pain Managment: ?Goal: General experience of comfort will improve ?Outcome: Progressing ?  ?

## 2022-05-09 NOTE — Progress Notes (Signed)
Orthopedic Tech Progress Note Patient Details:  [name] [name] 1956-01-10 [id number]  Patient ID: [name] [name], female   DOB: 24-Oct-1955, 66 y.o.   MRN: [id number]  [name] [name] 05/09/2022, 3:43 PM Right post op shoe applied

## 2022-05-09 NOTE — Discharge Instructions (Signed)
Foot & Ankle Surgery Patient Discharge Instructions:   Arrange to have an adult drive you home after surgery. If you had general anesthesia, it may take a day or more to fully recover. So, for at least the next 24 hours: Do not drive or use machinery or power tools; do not drink alcohol; and do not make any major decisions.   Bandages: Keep your dressings clean, dry, and intact. Do not remove or change. When bathing/showering, cover the bandage or cast with a plastic bag to keep it dry (still keep the area away from the water) and use a chair, do not stand. Don't remove your bandage until your doctor tells you to. If your bandage gets wet or dirty, check with your doctor. You can likely replace it with a clean, dry one.  Activity: Weightbearing as tolerated to the right foot in a post op shoe Sit or lie down when possible. Put a pillow under your heel to raise your foot above the level of your heart. Place ice bag or pack behind knee on surgical side for 20 minutes every hour that you are awake for the first 24-48 hours. You can drive again when instructed by your doctor. Wear your surgical shoe at all times unless told otherwise by your health care provider. Use crutches or a cane as directed. Follow your doctor's instructions about putting weight on your foot.  Diet: Start with liquids and light foods (such as dry toast, bananas, and applesauce). As you feel up to it, slowly return to your normal diet. Drink at least six to eight glasses of water or other nonalcoholic fluids a day. To avoid nausea, eat before taking narcotic pain medications.  Medications: Take all medications as instructed. Take pain medications on time. Do not wait until the pain is bad before taking your medications. Avoid alcohol while on pain medications.  Call your doctor if: Continuous bleeding through dressings. If your dressings get wet. Fevers over 100.4 degrees Fahrenheit (38 degrees Celsius). Chest pains  or difficulty breathing.

## 2022-05-09 NOTE — Transfer of Care (Signed)
Immediate Anesthesia Transfer of Care Note  Patient: [name] [name]  Procedure(s) Performed: AMPUTATION OF DIGIT Right Great Toe (Right)  Patient Location: PACU  Anesthesia Type:MAC  Level of Consciousness: awake  Airway & Oxygen Therapy: Patient Spontanous Breathing  Post-op Assessment: Report given to RN  Post vital signs: stable  Last Vitals:  Vitals Value Taken Time  BP 115/59 05/09/22 1415  Temp 36.3 C 05/09/22 1413  Pulse 55 05/09/22 1417  Resp 13 05/09/22 1417  SpO2 100 % 05/09/22 1417  Vitals shown include unvalidated device data.  Last Pain:  Vitals:   05/09/22 1235  TempSrc:   PainSc: 0-No pain         Complications: No notable events documented.

## 2022-05-09 NOTE — Anesthesia Procedure Notes (Signed)
Procedure Name: MAC Date/Time: 05/09/2022 1:35 PM  Performed by: Jannetta Massey, Forest Gleason, CRNAPre-anesthesia Checklist: Patient identified, Emergency Drugs available, Suction available, Patient being monitored and Timeout performed Patient Re-evaluated:Patient Re-evaluated prior to induction Induction Type: IV induction

## 2022-05-09 NOTE — Progress Notes (Signed)
No charge progress note.  [name] [name] is a 66 y.o. female with medical history significant of hypertension, hyperlipidemia, diabetes, neuropathy, stroke, history of osteomyelitis presenting with diabetic foot wound.   Patient has known history of diabetic foot wound and follows with podiatry outpatient.  She was seen in the office earlier today and sent to the ED due to concern for developing osteomyelitis of the distal great toe.  She was told plan is for IV antibiotics and possible amputation.  MRI concerning for distal great toe ulcer with underlying osteomyelitis and possible early developing septic arthritis. Podiatry to do amputation today. She was placed on broad-spectrum antibiotics with vancomycin, ceftriaxone and Flagyl.  9/27: Patient was seen after surgery.  Still somnolent with anesthesia.  Cultures were sent. S/p partial amputation of right hallux at IPJ level, right foot. PT evaluation when able to participate.

## 2022-05-09 NOTE — ED Notes (Signed)
Pt assisted to BR, one person assist, steady gait noted

## 2022-05-09 NOTE — Op Note (Signed)
Full Operative Report  Date of Operation: 2:17 PM, 05/09/2022   Patient: [name] [name] - 66 y.o. female  Surgeon: Yevonne Pax, DPM   Assistant: None  Diagnosis: Osteomilitis Right Great Toe  Procedure:  Partial amputation of right hallux at IPJ level, right foot    Anesthesia: Monitor Anesthesia Care  Belinda Block, MD  Anesthesiologist: Belinda Block, MD CRNA: Catha Gosselin, CRNA   Estimated Blood Loss: Minimal   Hemostasis: 1) Anatomical dissection, mechanical compression, electrocautery 2) Ankle tourniquet inflated to 250 mmHg  Implants: * No implants in log *  Materials: Prolene  Injectables: 1) Pre-operatively: 10 cc of 1% lido with 0.25% marcaine plain 2) Post-operatively: None  Specimens: Distal right hallux for pathology, distal phalanx bone for culture, wound culture aerobic anaerobic hallux IPJ    Antibiotics: Given as scheduled from the floor  Drains: None  Complications: Patient tolerated the procedure well without complication.   Findings: as below  Indications for Procedure: [name] [name] presents to [facility], [place] with a chief complaint of distal right hallux wound and infection. Concern for MRI with osteomyelitis in the distal phalanx. The patient has failed conservative treatments of various modalities. At this time the patient has elected to proceed with surgical correction. All alternatives, risks, and complications of the procedures were thoroughly explained to the patient. Patient exhibits appropriate understanding of all discussion points and informed consent was signed and obtained in the chart with no guarantees to surgical outcome given or implied.  Description of Procedure: Patient was brought to the operating room and placed on the operative table in the supine position. Patient was secured to the table with safety belt, a contralateral SCD was placed, and all bony prominences were well padded. A surgical  timeout was performed and all members of the operating room, the procedure, and the surgical site were identified. Local MAC anesthesia occurred. Local anesthetic as previously described was then injected about the operative field in a local infiltrative block.   A well padded pneumatic tourniquet was placed to the operative limb. The Right lower extremity was then prepped and draped in the usual sterile manner. The pneumatic tourniquet was inflated. The following procedure then began.  Attention was directed to the distal hallux of the right foot. A full-thickness incision encompassing the entire distal digit was made using a #15 blade and was done so that a plantar flap was maintained. Dissection was carried down to bone. The distal phalanx was secured with a towel clamp, further dissected in its entirety, and disarticulated at the hallux interphalangeal joint (IPJ) and passed to the back table as a gross specimen. This was then labled and sent to pathology. The bone was noted to be soft and eroded, and consistent with osteomyelitis. A bone specimen from the distal phalanx was taken and sent for culture. All remaining necrotic and devitalized soft tissue structures were visualized and dissected away using sharp and dull dissection. No frank pus or purulent drainage was noted. Care was taken to protect all neurovascular structures throughout the dissection. All bleeders were cauterized as necessary. A deep tissue culture was obtained at this time at the IPJ. The area was then flushed with copious amounts of sterile saline. Then using the suture materials previously described, the site was closed in anatomic layers and the skin was well approximated under minimal tension. The plantar digital flap was approximated using prolene.   The surgical site was then dressed with betadine adaptic 4x4 kerlix ace wrap.  The tourniquet was deflated after 17 minutes with immediate vascular return noted to the operative foot.  The patient tolerated both the procedure and anesthesia well with vital signs stable throughout. The patient was transferred from the OR to recovery under the discretion of anesthesia.  Condition: Patient was taken to PACU in good condition and all vital signs stable and neurovascular status intact to the operative limb.  Discharge: Patient will be readmitted to the floor per anesthesia for ongoing IV abx and monitoring.  Osvaldo Lamping, Nena Karrisa Didio, DPM will follow the patient throughout the entire post-operative course and the patient is aware of all post-operative protocols in place. The patient will follow the protocol of rest, ice, and elevation. The patient will be Weightbearing as tolerated in a post op shoe to the operative limb until further instructed. The dressing is to remain clean, dry, and intact.   Ames Coupe, DPM

## 2022-05-09 NOTE — ED Notes (Signed)
Pt given ham sandwich, crackers, and ice water

## 2022-05-09 NOTE — Anesthesia Postprocedure Evaluation (Signed)
Anesthesia Post Note  Patient: [name] [name]  Procedure(s) Performed: AMPUTATION OF DIGIT Right Great Toe (Right)     Patient location during evaluation: PACU Anesthesia Type: MAC Level of consciousness: awake Pain management: pain level controlled Vital Signs Assessment: post-procedure vital signs reviewed and stable Respiratory status: spontaneous breathing Cardiovascular status: stable Postop Assessment: no apparent nausea or vomiting Anesthetic complications: no   No notable events documented.  Last Vitals:  Vitals:   05/09/22 1515 05/09/22 1530  BP: 135/71 (!) 145/68  Pulse: (!) 57   Resp: 17 17  Temp:    SpO2: 100%     Last Pain:  Vitals:   05/09/22 1515  TempSrc:   PainSc: 0-No pain                 [name]

## 2022-05-09 NOTE — ED Notes (Signed)
Pt reminded of NPO status at 5 am, pt verbalized understanding. Crackers, water, and diet Dr. Malachi Bonds removed from bedside table.

## 2022-05-09 NOTE — Hospital Course (Addendum)
Taken from H&P.  [name] [name] is a 66 y.o. female with medical history significant of hypertension, hyperlipidemia, diabetes, neuropathy, stroke, history of osteomyelitis presenting with diabetic foot wound.   Patient has known history of diabetic foot wound and follows with podiatry outpatient.  She was seen in the office earlier today and sent to the ED due to concern for developing osteomyelitis of the distal great toe.  She was told plan is for IV antibiotics and possible amputation.  MRI concerning for distal great toe ulcer with underlying osteomyelitis and possible early developing septic arthritis. Podiatry to do amputation today. She was placed on broad-spectrum antibiotics with vancomycin, ceftriaxone and Flagyl.  9/27: Patient was seen after surgery.  Still somnolent with anesthesia.  Cultures were sent. S/p partial amputation of right hallux at IPJ level, right foot. PT evaluation when able to participate.

## 2022-05-09 NOTE — Anesthesia Preprocedure Evaluation (Signed)
Anesthesia Evaluation  Patient identified by MRN, date of birth, ID band Patient awake    Reviewed: Allergy & Precautions, NPO status , Patient's Chart, lab work & pertinent test results  Airway Mallampati: II       Dental   Pulmonary neg pulmonary ROS,    breath sounds clear to auscultation       Cardiovascular hypertension,  Rhythm:Regular Rate:Normal     Neuro/Psych  Neuromuscular disease CVA    GI/Hepatic negative GI ROS, Neg liver ROS,   Endo/Other  diabetes  Renal/GU negative Renal ROS     Musculoskeletal   Abdominal   Peds  Hematology   Anesthesia Other Findings   Reproductive/Obstetrics                             Anesthesia Physical Anesthesia Plan  ASA: 3  Anesthesia Plan: MAC   Post-op Pain Management:    Induction: Intravenous  PONV Risk Score and Plan: 2  Airway Management Planned: Simple Face Mask  Additional Equipment:   Intra-op Plan:   Post-operative Plan:   Informed Consent: I have reviewed the patients History and Physical, chart, labs and discussed the procedure including the risks, benefits and alternatives for the proposed anesthesia with the patient or authorized representative who has indicated his/her understanding and acceptance.     Dental advisory given  Plan Discussed with: CRNA and Anesthesiologist  Anesthesia Plan Comments:         Anesthesia Quick Evaluation

## 2022-05-09 NOTE — ED Notes (Signed)
Pt has signed surgical consent and blood consent prior to schedule surgery later today. Refer to electronic consent in EMR.

## 2022-05-09 NOTE — Progress Notes (Signed)
The patient's current CBG is 419. Informed Gershon Cull.

## 2022-05-09 NOTE — Progress Notes (Signed)
PODIATRY PROGRESS NOTE Patient Name: [name] [name]  DOB [birth year] DOA 05/08/2022  Hospital Day: 2  Assessment:  66 y.o. female with PMHx significant for  DM type 2 with neuropathy with Ulceration present to the distal tuft L hallux with XR and MRI concerning for osteomyelitis of the right hallux distal phalanx.   AF, HTN  WBC: 10.9 ESR/CRP: 30/4.1  Wound/Bone Cultures: to be collected in OR  Imaging:  XR R foot 05/08/22: Osteolysis and radiolucency of the distal aspect of the distal phalanx of the right hallux consistent with osteomyelitis  MRI R foot WO contrast 05/08/22: Distal great toe ulcer with underlying osteomyelitis of the distal phalanx. Trace great toe IP joint effusion which may suggest developing septic arthritis, though there is no significant marrow signal abnormality in the adjacent proximal phalanx at this time.  Plan:  - NPO for OR today for partial Right hallux amputation at level of IPJ vs head of proximal phalanx  - Continue IV abx broad spectrum pending further culture data - Anticoagulation: Resume per primary post procedure - Wound care: leave dressing applied in OR clean dry intact - WB status: Weightbearing as tolerated in post op shoe Will continue to follow        Everitt Amber, [place]    Subjective:  Patient seen in Pre op, all questions answered. Aware of findings thus far and plans for amputation. Clarified the level of amputation in the toe being at the IPJ or slightly proximal to there meaning the whole toe will not be amputated today. Also discussed aftercare and expected postop course.   Objective:   Vitals:   05/09/22 1204 05/09/22 1206  BP: (!) 172/81 (!) 143/81  Pulse: 66   Resp: 16   Temp: 97.9 F (36.6 C)   SpO2: 99%        Latest Ref Rng & Units 05/09/2022    5:18 AM 05/08/2022    2:50 PM 10/02/2021    3:30 PM  CBC  WBC 4.0 - 10.5 K/uL 10.9  11.1  9.4   Hemoglobin 12.0 - 15.0 g/dL 12.7  12.2  12.5    Hematocrit 36.0 - 46.0 % 40.1  37.9  39.0   Platelets 150 - 400 K/uL 330  310  340        Latest Ref Rng & Units 05/09/2022    5:18 AM 05/08/2022    2:50 PM 10/02/2021    3:30 PM  BMP  Glucose 70 - 99 mg/dL 144  357  200   BUN 8 - 23 mg/dL _0 Creatinine 0.44 - 1.00 mg/dL 1.10  1.21  1.11   BUN/Creat Ratio 12 - 28   13   Sodium 135 - 145 mmol/L 141  135  139   Potassium 3.5 - 5.1 mmol/L 3.6  4.7  5.0   Chloride 98 - 111 mmol/L 105  102  102   CO2 22 - 32 mmol/L _1 Calcium 8.9 - 10.3 mg/dL 9.6  9.4  10.2     General: AAOx3, NAD  Lower Extremity Exam Lower Extremity Exam Vasc:     R - PT palpable, DP palpable. Cap refill < 3 sec to digits               L - PT palpable, DP palpable. Cap refill <3 sec to digits   Derm:    R - There  is an ulcer present distal aspect of the hallux which probes close to bone.  There is edema and erythema present to the hallux and there is streaking to the dorsal aspect of the foot to the ankle joint. No drainage from wound. No fluctuance or crepitus appreciated.               L - Normal temp/texture/turgor with no open lesion or clinical signs of infection   MSK:     R - Mallet toe deformity of the right hallux               L -  No gross deformities. Compartments soft, non-tender, compressible   Neuro:   R - Gross sensation diminished. Gross motor function intact                 L - Gross sensation diminished. Gross motor function intact     Radiology:  Results reviewed. See assessment for pertinent imaging results

## 2022-05-09 NOTE — Progress Notes (Signed)
CBG 444. Paged Gershon Cull to advise insulin admin and/or stat lab. Per pt: this is what her blood sugar runs.

## 2022-05-09 NOTE — Progress Notes (Incomplete)
Pharmacy Antibiotic Note  [name] [name] is a 66 y.o. female admitted on 05/08/2022 with ***.  Pharmacy has been consulted for Vancomycin dosing.  Plan: {Assessment:21075}  Weight: 74.5 kg (164 lb 4.8 oz)  Temp (24hrs), Avg:98.1 F (36.7 C), Min:97.5 F (36.4 C), Max:98.4 F (36.9 C)  Recent Labs  Lab 05/08/22 1450  WBC 11.1*  CREATININE 1.21*  LATICACIDVEN 1.6    Estimated Creatinine Clearance: 42.1 mL/min (A) (by C-G formula based on SCr of 1.21 mg/dL (H)).    Allergies  Allergen Reactions   Ace Inhibitors Cough   Glipizide    Glyburide Other (See Comments)    Other reaction(s): hypoglycemia and weight gain   Liraglutide     Other reaction(s): stomach upset   Meclizine Hcl     Antimicrobials this admission: *** *** >> *** *** *** >> ***  Dose adjustments this admission: ***  Microbiology results: *** BCx: *** *** UCx: ***  *** Sputum: ***  *** MRSA PCR: ***  Thank you for allowing pharmacy to be a part of this patient's care.  Sharyn Lull Tayli Buch 05/09/2022 12:00 AM

## 2022-05-09 NOTE — ED Notes (Signed)
Pt ambulated to BR, steady gait noted, labs to be obtain once pt returns to treatment area

## 2022-05-09 NOTE — ED Notes (Signed)
Morning labs obtain, secure message sent to J. Olena Heckle, NP (floor coverage) inquiring a type & screen as pt will have sx later today. Awaiting response.

## 2022-05-10 ENCOUNTER — Encounter (HOSPITAL_COMMUNITY): Payer: Self-pay | Admitting: Podiatry

## 2022-05-10 DIAGNOSIS — E11628 Type 2 diabetes mellitus with other skin complications: Secondary | ICD-10-CM | POA: Diagnosis not present

## 2022-05-10 DIAGNOSIS — I10 Essential (primary) hypertension: Secondary | ICD-10-CM | POA: Diagnosis not present

## 2022-05-10 DIAGNOSIS — M8618 Other acute osteomyelitis, other site: Secondary | ICD-10-CM

## 2022-05-10 DIAGNOSIS — E7800 Pure hypercholesterolemia, unspecified: Secondary | ICD-10-CM

## 2022-05-10 DIAGNOSIS — Z8673 Personal history of transient ischemic attack (TIA), and cerebral infarction without residual deficits: Secondary | ICD-10-CM | POA: Diagnosis not present

## 2022-05-10 LAB — CBC WITH DIFFERENTIAL/PLATELET
Abs Immature Granulocytes: 0.08 10*3/uL — ABNORMAL HIGH (ref 0.00–0.07)
Basophils Absolute: 0 10*3/uL (ref 0.0–0.1)
Basophils Relative: 0 %
Eosinophils Absolute: 0 10*3/uL (ref 0.0–0.5)
Eosinophils Relative: 0 %
HCT: 33.8 % — ABNORMAL LOW (ref 36.0–46.0)
Hemoglobin: 10.8 g/dL — ABNORMAL LOW (ref 12.0–15.0)
Immature Granulocytes: 1 %
Lymphocytes Relative: 6 %
Lymphs Abs: 1 10*3/uL (ref 0.7–4.0)
MCH: 27.5 pg (ref 26.0–34.0)
MCHC: 32 g/dL (ref 30.0–36.0)
MCV: 86 fL (ref 80.0–100.0)
Monocytes Absolute: 0.4 10*3/uL (ref 0.1–1.0)
Monocytes Relative: 2 %
Neutro Abs: 13.5 10*3/uL — ABNORMAL HIGH (ref 1.7–7.7)
Neutrophils Relative %: 91 %
Platelets: 290 10*3/uL (ref 150–400)
RBC: 3.93 MIL/uL (ref 3.87–5.11)
RDW: 15 % (ref 11.5–15.5)
WBC: 15 10*3/uL — ABNORMAL HIGH (ref 4.0–10.5)
nRBC: 0 % (ref 0.0–0.2)

## 2022-05-10 LAB — GLUCOSE, CAPILLARY
Glucose-Capillary: 180 mg/dL — ABNORMAL HIGH (ref 70–99)
Glucose-Capillary: 207 mg/dL — ABNORMAL HIGH (ref 70–99)
Glucose-Capillary: 224 mg/dL — ABNORMAL HIGH (ref 70–99)
Glucose-Capillary: 232 mg/dL — ABNORMAL HIGH (ref 70–99)
Glucose-Capillary: 275 mg/dL — ABNORMAL HIGH (ref 70–99)
Glucose-Capillary: 358 mg/dL — ABNORMAL HIGH (ref 70–99)
Glucose-Capillary: 368 mg/dL — ABNORMAL HIGH (ref 70–99)

## 2022-05-10 LAB — COMPREHENSIVE METABOLIC PANEL
ALT: 17 U/L (ref 0–44)
AST: 19 U/L (ref 15–41)
Albumin: 3 g/dL — ABNORMAL LOW (ref 3.5–5.0)
Alkaline Phosphatase: 47 U/L (ref 38–126)
Anion gap: 7 (ref 5–15)
BUN: 27 mg/dL — ABNORMAL HIGH (ref 8–23)
CO2: 22 mmol/L (ref 22–32)
Calcium: 8.9 mg/dL (ref 8.9–10.3)
Chloride: 109 mmol/L (ref 98–111)
Creatinine, Ser: 1 mg/dL (ref 0.44–1.00)
GFR, Estimated: >60 mL/min (ref >60)
Glucose, Bld: 180 mg/dL — ABNORMAL HIGH (ref 70–99)
Potassium: 4.1 mmol/L (ref 3.5–5.1)
Sodium: 138 mmol/L (ref 135–145)
Total Bilirubin: 0.4 mg/dL (ref 0.3–1.2)
Total Protein: 6.8 g/dL (ref 6.5–8.1)

## 2022-05-10 LAB — SURGICAL PATHOLOGY

## 2022-05-10 LAB — MAGNESIUM: Magnesium: 1.6 mg/dL — ABNORMAL LOW (ref 1.7–2.4)

## 2022-05-10 LAB — PHOSPHORUS: Phosphorus: 2.8 mg/dL (ref 2.5–4.6)

## 2022-05-10 MED ORDER — VANCOMYCIN HCL IN DEXTROSE 1-5 GM/200ML-% IV SOLN
1000.0000 mg | INTRAVENOUS | Status: DC
  Administered 2022-05-10: 1000 mg via INTRAVENOUS
  Filled 2022-05-10: qty 200

## 2022-05-10 MED ORDER — INSULIN GLARGINE-YFGN 100 UNIT/ML ~~LOC~~ SOLN
10.0000 [IU] | Freq: Every day | SUBCUTANEOUS | Status: DC
  Administered 2022-05-10 – 2022-05-11 (×2): 10 [IU] via SUBCUTANEOUS
  Filled 2022-05-10 (×2): qty 0.1

## 2022-05-10 MED ORDER — INSULIN ASPART 100 UNIT/ML IJ SOLN
16.0000 [IU] | Freq: Once | INTRAMUSCULAR | Status: AC
  Administered 2022-05-10: 16 [IU] via SUBCUTANEOUS

## 2022-05-10 NOTE — Progress Notes (Signed)
[place] NP to inform that the patient's blood sugar is 358 and was instructed to do blood sugar checks every 4 hours.

## 2022-05-10 NOTE — Progress Notes (Signed)
Pharmacy Antibiotic Note  [name] [name] is a 66 y.o. female admitted on 05/08/2022 with  osteomyelitis of R distal phalanx per MRI .  Pharmacy has been consulted for Vancomycin dosing.  Today, 05/10/2022: D2 Vanc/Ceftriaxone/Flagyl SCr continues to trend down >> new vanc eAUC ~350 on 750 mg/d WBC increased (did improve yesterday prior to getting pre-op dexamethasone) Remains afebrile since admission  Plan: Increase vancomycin to 1000 mg IV q24 hr with improved SCr (new eAUC 468 w/ SCr 1.0; Vd 0.72) Ceftriaxone/Flagyl per MD; dosing appropriate F/u postop LOT per Podiatry recommendations Hold off on vancomycin levels unless needing extended treatment   Height: 5\' 2"  (157.5 cm) Weight: 74 kg (163 lb 2.3 oz) IBW/kg (Calculated) : 50.1  Temp (24hrs), Avg:97.7 F (36.5 C), Min:97.6 F (36.4 C), Max:97.9 F (36.6 C)  Recent Labs  Lab 05/08/22 1450 05/09/22 0518 05/09/22 1422 05/10/22 0756  WBC 11.1* 10.9*  --  15.0*  CREATININE 1.21* 1.10*  --  1.00  LATICACIDVEN 1.6  --  1.1  --      Estimated Creatinine Clearance: 50.7 mL/min (by C-G formula based on SCr of 1 mg/dL).    Allergies  Allergen Reactions   Ace Inhibitors Cough   Glipizide    Glyburide Other (See Comments)    Other reaction(s): hypoglycemia and weight gain   Liraglutide     Other reaction(s): stomach upset   Meclizine Hcl     Antimicrobials this admission: 9/27 Ceftriaxone >>  9/27 vancomycin >>  6/27 Metronidazole >>  Dose adjustments this admission: 9/28 increase vancomycin 750 >> 1000 q24 with improved SCr  Microbiology results: 9/26 BCx: ngtd 9/27 bone Cx (from OR): ngtd 9/27 tissue Cx (from OR): ngtd   Thank you for allowing pharmacy to be a part of this patient's care.  Dodie Parisi A PharmD 05/10/2022 3:31 PM

## 2022-05-10 NOTE — Plan of Care (Signed)
Plan of care reviewed and discussed with the patient. 

## 2022-05-10 NOTE — Inpatient Diabetes Management (Signed)
Inpatient Diabetes Program Recommendations  AACE/ADA: New Consensus Statement on Inpatient Glycemic Control (2015)  Target Ranges:  Prepandial:   less than 140 mg/dL      Peak postprandial:   less than 180 mg/dL (1-2 hours)      Critically ill patients:  140 - 180 mg/dL   Lab Results  Component Value Date   GLUCAP 180 (H) 05/10/2022   HGBA1C 9.8 (H) 10/02/2021    Review of Glycemic Control  Diabetes history: DM2 Outpatient Diabetes medications: Lantus 60 units QHS, metformin 500 mg QAM,  Current orders for Inpatient glycemic control: Semglee 20 units QHS, Novolog 0-15 Q4H  Inpatient Diabetes Program Recommendations:    Consider Semglee to 30 units QHS  Novolog 0-15 units TID with meals and 0-5 HS  With elevated post-prandials, may need meal coverage insulin, not eating 50% yet. Novolog 4 units TID for when pt is eating better.  Continue to follow.   Thank you. Lorenda Peck, RD, LDN, [place] Inpatient Diabetes Coordinator [phone]

## 2022-05-10 NOTE — Progress Notes (Signed)
[name] [name] FTD:[id number] DOB: [birth year] DOA: 05/08/2022 PCP: Coral Spikes, DO   Subj: 66 y.o. WF PMHx essential HTN, HLD, DM type II uncontrolled with complications, DM Nephropathy, CVA, Hx osteomyelitis.  Presenting with diabetic foot wound.   Patient has known history of diabetic foot wound and follows with podiatry outpatient.  She was seen in the office earlier today and sent to the ED due to concern for developing osteomyelitis of the distal great toe.  She was told plan is for IV antibiotics and possible amputation.   She denies fevers, chills, chest pain, shortness of breath, abdominal pain, constipation, diarrhea, nausea, vomiting.  No significant pain of the toe due to her neuropathy. ED Course: Vital signs in the ED significant for blood pressure in the 062B to 762G systolic.  Lab work-up included BMP with creatinine of 1.21 near baseline of 1.1, glucose 357.  CBC with leukocytosis to 11.1.  Lactic acid normal with repeat pending.  ESR and CRP pending.  Blood cultures pending.  Obj: A/O x4, sitting comfortably in chair    Objective: VITAL SIGNS: Temp: 97.6 F (36.4 C) (09/28 0551) Temp Source: Oral (09/28 0551) BP: 122/53 (09/28 0551) Pulse Rate: 58 (09/28 0551) SPO2; FIO2:   Intake/Output Summary (Last 24 hours) at 05/10/2022 0659 Last data filed at 05/09/2022 1350 Gross per 24 hour  Intake --  Output 5 ml  Net -5 ml     Exam: General: No acute respiratory distress Lungs: Clear to auscultation bilaterally without wheezes or crackles Cardiovascular: Regular rate and rhythm without murmur gallop or rub normal S1 and S2 Abdomen: Nontender, nondistended, soft, bowel sounds positive, no rebound, no ascites, no appreciable mass Extremities: No significant cyanosis, clubbing, or edema bilateral lower extremities           Skin: Negative rashes, lesions, ulcers Psychiatric:  Negative depression, negative anxiety, negative fatigue, negative mania  Central  nervous system:  Cranial nerves II through XII intact, tongue/uvula midline, all extremities muscle strength 5/5, sensation intact throughout, f negative dysarthria, negative expressive aphasia, negative receptive aphasia.  .  Mobility Assessment (last 72 hours)     Mobility Assessment     Row Name 05/09/22 2144 05/09/22 1625         Does patient have an order for bedrest or is patient medically unstable No - Continue assessment No - Continue assessment      What is the highest level of mobility based on the progressive mobility assessment? Level 5 (Walks with assist in room/hall) - Balance while stepping forward/back and can walk in room with assist - Complete Level 3 (Stands with assist) - Balance while standing  and cannot march in place      Is the above level different from baseline mobility prior to current illness? Yes - Recommend PT order Yes - Recommend PT order                 DVT prophylaxis:  Code Status:  Family Communication: 9/28 husband at bedside for discussion of plan of care all questions answered Status is: Inpatient    Dispo: The patient is from: Home              Anticipated d/c is to: SNF              Anticipated d/c date is: 2 days              Patient currently is not medically stable to d/c.    Procedures/Significant Events:  MRI of the right foot did confirm distal great toe ulcer with underlying osteomyelitis and possible early developing septic arthritis.  Also noted was diffuse soft tissue swelling.   Consultants:  ID CCS   Cultures   Antimicrobials: Anti-infectives (From admission, onward)    Start     Dose/Rate Route Frequency Ordered Stop   05/10/22 1800  vancomycin (VANCOCIN) IVPB 1000 mg/200 mL premix        1,000 mg 200 mL/hr over 60 Minutes Intravenous Every 24 hours 05/10/22 1539     05/10/22 0200  vancomycin (VANCOREADY) IVPB 750 mg/150 mL  Status:  Discontinued        750 mg 150 mL/hr over 60 Minutes Intravenous Every 24  hours 05/09/22 0102 05/10/22 1539   05/09/22 0000  vancomycin (VANCOREADY) IVPB 1500 mg/300 mL        1,500 mg 150 mL/hr over 120 Minutes Intravenous  Once 05/08/22 2359 05/09/22 0259   05/08/22 2330  cefTRIAXone (ROCEPHIN) 2 g in sodium chloride 0.9 % 100 mL IVPB        2 g 200 mL/hr over 30 Minutes Intravenous Every 24 hours 05/08/22 2317 05/15/22 2329   05/08/22 2330  metroNIDAZOLE (FLAGYL) tablet 500 mg        500 mg Oral Every 12 hours 05/08/22 2317 05/15/22 2159        A/P RIGHT foot osteomyelitis -Podiatry consulted and recommended patient be n.p.o. at 5 AM for amputation in the morning and to continue with broad-spectrum antibiotics for now.  Diabetic foot infection Osteomyelitis > Patient presenting from podiatry due to concern for developing osteomyelitis in setting of known diabetic foot infection. > MRI in the ED did confirm osteomyelitis with possible developing septic arthritis. > Patient noted to have leukocytosis to 11.1 but otherwise stable labs and vital signs. > Podiatry consulted recommending n.p.o. at 5 AM and continue with broad-spectrum antibiotics as started in the ED which include vancomycin, ceftriaxone, Flagyl. - Monitor on MedSurg unit - Appreciate podiatry recommendations - Continue with vancomycin, ceftriaxone, Flagyl - Trend fever curve and WBC - N.p.o. at midnight   DM type II uncontrolled with hyperglycemia > On 20 to 60 units nightly at home.  Glucose 357 in the ED in the setting of foot infection. - Continue with 20 units nightly - SSI every 4 hours as patient will be n.p.o. by the time she is due for initial dose. CBG (last 3)  Recent Labs    05/10/22 0031 05/10/22 0046 05/10/22 0434  GLUCAP 368* 358* 232*  -9/28 increase Semglee 10 units daily, 20 units qhs - Moderate SSI    Hypertension - Continue home losartan   Hyperlipidemia - Continue home atorvastatin   History of CVA - Continue home atorvastatin - Hold home aspirin in the  setting of planned surgery       Care during the described time interval was provided by me .  I have reviewed this patient's available data, including medical history, events of note, physical examination, and all test results as part of my evaluation.

## 2022-05-10 NOTE — Progress Notes (Addendum)
PODIATRY PROGRESS NOTE Patient Name: [name] [name]  DOB 11/22/1955 DOA 05/08/2022  Hospital Day: 3  Assessment:  66 y.o. female with PMHx significant for  DM type 2 with neuropathy with Ulceration present to the distal tuft L hallux with XR and MRI concerning for osteomyelitis of the right hallux distal phalanx.   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 PHALANX, RIGHT DISTAL, AMPUTATION: - Digit, clinically right distal great toe showing necrotizing inflammation with acute osteomyelitis   Imaging:  XR R foot 05/08/22: Osteolysis and radiolucency of the distal aspect of the distal phalanx of the right hallux consistent with osteomyelitis  MRI R foot WO contrast 05/08/22: Distal great toe ulcer with underlying osteomyelitis of the distal phalanx. Trace great toe IP joint effusion which may suggest developing septic arthritis, though there is no significant marrow signal abnormality in the adjacent proximal phalanx at this time.  Plan:  - S/p R partial hallux amputation at IPJ level healing as expected post op. Dressing changed today, betadine gauze dressing applied. -Abx per primary, recommend short course of PO abx such as augmentin 875-125 BID x 7 days. Cultures pending. - Anticoagulation: Resume per primary post procedure - Wound care: leave dressing applied bedside today clean dry and intact until follow up in office next week Thursday. - WB status: Weightbearing as tolerated in post op shoe, though encouraged to reduce weightbearing amount. - Stable from podiatry standpoint for discharge 9/29 AM with home PO abx         Everitt Amber, [place]    Subjective:  Patient seen post op earlier this evening. Doing well denies pain. States dressing was removed partially due to some bleeding but everything looked good. Aware of plans for  follow up next week. All questions answered.   Objective:   Vitals:   05/10/22 1745 05/10/22 2036  BP: (!) 141/60 (!) 129/56  Pulse: 61 67  Resp: 17 18  Temp: 97.8 F (36.6 C) 97.9 F (36.6 C)  SpO2: 100% 99%       Latest Ref Rng & Units 05/10/2022    7:56 AM 05/09/2022    5:18 AM 05/08/2022    2:50 PM  CBC  WBC 4.0 - 10.5 K/uL 15.0  10.9  11.1   Hemoglobin 12.0 - 15.0 g/dL 10.8  12.7  12.2   Hematocrit 36.0 - 46.0 % 33.8  40.1  37.9   Platelets 150 - 400 K/uL 290  330  310        Latest Ref Rng & Units 05/10/2022    7:56 AM 05/09/2022    5:18 AM 05/08/2022    2:50 PM  BMP  Glucose 70 - 99 mg/dL 180  144  357   BUN 8 - 23 mg/dL 27  23  21    Creatinine 0.44 - 1.00 mg/dL 1.00  1.10  1.21   Sodium 135 - 145 mmol/L 138  141  135   Potassium 3.5 - 5.1 mmol/L 4.1  3.6  4.7   Chloride 98 - 111 mmol/L 109  105  102   CO2 22 - 32 mmol/L 22  26  24    Calcium 8.9 - 10.3 mg/dL 8.9  9.6  9.4     General: AAOx3, NAD  Lower Extremity Exam Lower Extremity Exam Vasc:  R - PT palpable, DP palpable. Cap refill < 3 sec to digits               L - PT palpable, DP palpable. Cap refill <3 sec to digits   Derm:    R - Dressing changed. Incision site well coapted no erythema dehisence or drainage.                   L - Normal temp/texture/turgor with no open lesion or clinical signs of infection   MSK:     R - S/p Partial amputation R hallux               L -  No gross deformities. Compartments soft, non-tender, compressible   Neuro:   R - Gross sensation diminished. Gross motor function intact                 L - Gross sensation diminished. Gross motor function intact     Radiology:  Results reviewed. See assessment for pertinent imaging results

## 2022-05-10 NOTE — Plan of Care (Signed)
  Problem: Activity: Goal: Risk for activity intolerance will decrease Outcome: Progressing   Problem: Pain Managment: Goal: General experience of comfort will improve Outcome: Progressing   Problem: Safety: Goal: Ability to remain free from injury will improve Outcome: Progressing   

## 2022-05-10 NOTE — Progress Notes (Signed)
Arrived to patient's room. MD at bedside. Fran Lowes, RN VAST

## 2022-05-10 NOTE — Evaluation (Signed)
Physical Therapy Evaluation Patient Details Name: [name] [name] MRN: [id number] DOB: [birth year] Today's Date: 05/10/2022  History of Present Illness  Pt s/p Partial amputationi R hallux at [facility] and with hx of DM and CVA (pt reports minimal residual and was able to return to work).  Clinical Impression  Pt admitted as above and presenting with functional mobility limitations 2* mild balance deficits and minimal cueing for safety with activity.  Pt should progress well to dc home with family assist.     Recommendations for follow up therapy are one component of a multi-disciplinary discharge planning process, led by the attending physician.  Recommendations may be updated based on patient status, additional functional criteria and insurance authorization.  Follow Up Recommendations No PT follow up      Assistance Recommended at Discharge Intermittent Supervision/Assistance  Patient can return home with the following  Assist for transportation;Help with stairs or ramp for entrance;Assistance with cooking/housework;A little help with bathing/dressing/bathroom    Equipment Recommendations None recommended by PT  Recommendations for Other Services       Functional Status Assessment       Precautions / Restrictions Precautions Precautions: Fall;Other (comment) Precaution Comments: Post op shoe on R Restrictions Weight Bearing Restrictions: Yes RLE Weight Bearing: Weight bearing as tolerated Other Position/Activity Restrictions: WBAT with post op shoe      Mobility  Bed Mobility Overal bed mobility: Modified Independent             General bed mobility comments: NO physical assist    Transfers Overall transfer level: Needs assistance Equipment used: Rolling walker (2 wheels) Transfers: Sit to/from Stand Sit to Stand: Supervision           General transfer comment: supervision for safety onl; cues for use of UEs to self assist    Ambulation/Gait Ambulation/Gait  assistance: Min guard, Supervision Gait Distance (Feet): 180 Feet Assistive device: Rolling walker (2 wheels) Gait Pattern/deviations: Step-to pattern, Step-through pattern, Decreased step length - right, Decreased step length - left, Shuffle, Trunk flexed       General Gait Details: cues for posture and position from [employer]            Wheelchair Mobility    Modified Rankin (Stroke Patients Only)       Balance Overall balance assessment: Mild deficits observed, not formally tested                                           Pertinent Vitals/Pain Pain Assessment Pain Assessment: No/denies pain    Home Living Family/patient expects to be discharged to:: Private residence Living Arrangements: Spouse/significant other Available Help at Discharge: Family Type of Home: House Home Access: Stairs to enter Entrance Stairs-Rails: None Technical brewer of Steps: 2   Home Layout: One level Home Equipment: Conservation officer, nature (2 wheels)      Prior Function Prior Level of Function : Independent/Modified Independent             Mobility Comments: States occasionally used her mother's cane for stability       Hand Dominance   Dominant Hand: Left    Extremity/Trunk Assessment   Upper Extremity Assessment Upper Extremity Assessment: Overall WFL for tasks assessed    Lower Extremity Assessment Lower Extremity Assessment: Overall WFL for tasks assessed    Cervical / Trunk Assessment Cervical / Trunk Assessment: Normal  Communication  Communication: No difficulties  Cognition Arousal/Alertness: Awake/alert Behavior During Therapy: WFL for tasks assessed/performed Overall Cognitive Status: Within Functional Limits for tasks assessed                                          General Comments      Exercises     Assessment/Plan    PT Assessment Patient needs continued PT services  PT Problem List Decreased  balance;Decreased knowledge of use of DME       PT Treatment Interventions DME instruction;Gait training;Stair training;Functional mobility training;Therapeutic activities;Patient/family education    PT Goals (Current goals can be found in the Care Plan section)  Acute Rehab PT Goals Patient Stated Goal: GEt back to driving PT Goal Formulation: With patient Time For Goal Achievement: 05/17/22 Potential to Achieve Goals: Good    Frequency Min 5X/week     Co-evaluation               AM-PAC PT "6 Clicks" Mobility  Outcome Measure Help needed turning from your back to your side while in a flat bed without using bedrails?: None Help needed moving from lying on your back to sitting on the side of a flat bed without using bedrails?: None Help needed moving to and from a bed to a chair (including a wheelchair)?: A Little Help needed standing up from a chair using your arms (e.g., wheelchair or bedside chair)?: A Little Help needed to walk in hospital room?: A Little Help needed climbing 3-5 steps with a railing? : A Little 6 Click Score: 20    End of Session Equipment Utilized During Treatment: Gait belt Activity Tolerance: Patient tolerated treatment well Patient left: in chair;with call bell/phone within reach;with chair alarm set Nurse Communication: Mobility status PT Visit Diagnosis: Unsteadiness on feet (R26.81)    Time: 8366-2947 PT Time Calculation (min) (ACUTE ONLY): 26 min   Charges:   PT Evaluation $PT Eval Low Complexity: 1 Low          [place] Pager [phone] Office [phone]   Keevin Panebianco 05/10/2022, 3:25 PM

## 2022-05-10 NOTE — Progress Notes (Signed)
OT Cancellation Note  Patient Details Name: [name] [name] MRN: [id number] DOB: 10-20-1955   Cancelled Treatment:    Reason Eval/Treat Not Completed: OT screened, no needs identified, will sign off. Patient is modified independent for ADLs and reports no OT needs.   Concha Sudol L Pasha Gadison 05/10/2022, 12:55 PM

## 2022-05-10 NOTE — Progress Notes (Signed)
Patient's blood sugar at 4:34 was 232.

## 2022-05-11 DIAGNOSIS — E11628 Type 2 diabetes mellitus with other skin complications: Secondary | ICD-10-CM | POA: Diagnosis not present

## 2022-05-11 DIAGNOSIS — I10 Essential (primary) hypertension: Secondary | ICD-10-CM | POA: Diagnosis not present

## 2022-05-11 DIAGNOSIS — E7800 Pure hypercholesterolemia, unspecified: Secondary | ICD-10-CM | POA: Diagnosis not present

## 2022-05-11 DIAGNOSIS — M8618 Other acute osteomyelitis, other site: Secondary | ICD-10-CM | POA: Diagnosis not present

## 2022-05-11 LAB — GLUCOSE, CAPILLARY
Glucose-Capillary: 110 mg/dL — ABNORMAL HIGH (ref 70–99)
Glucose-Capillary: 113 mg/dL — ABNORMAL HIGH (ref 70–99)
Glucose-Capillary: 136 mg/dL — ABNORMAL HIGH (ref 70–99)
Glucose-Capillary: 194 mg/dL — ABNORMAL HIGH (ref 70–99)

## 2022-05-11 LAB — CREATININE, SERUM
Creatinine, Ser: 1.3 mg/dL — ABNORMAL HIGH (ref 0.44–1.00)
GFR, Estimated: 45 mL/min — ABNORMAL LOW (ref >60)

## 2022-05-11 MED ORDER — ACETAMINOPHEN 325 MG PO TABS: 650.0000 mg | ORAL_TABLET | Freq: Four times a day (QID) | ORAL | 0 refills | Status: DC | PRN

## 2022-05-11 MED ORDER — OXYCODONE-ACETAMINOPHEN 5-325 MG PO TABS: 1.0000 | ORAL_TABLET | ORAL | 0 refills | Status: DC | PRN

## 2022-05-11 MED ORDER — AMOXICILLIN-POT CLAVULANATE 875-125 MG PO TABS
1.0000 | ORAL_TABLET | Freq: Two times a day (BID) | ORAL | Status: DC
  Administered 2022-05-11: 1 via ORAL
  Filled 2022-05-11: qty 1

## 2022-05-11 MED ORDER — AMOXICILLIN-POT CLAVULANATE 875-125 MG PO TABS: 1.0000 | ORAL_TABLET | Freq: Two times a day (BID) | ORAL | 0 refills | Status: DC

## 2022-05-11 NOTE — Progress Notes (Signed)
Physical Therapy Treatment Patient Details Name: [name] [name] MRN: [id number] DOB: May 17, 1956 Today's Date: 05/11/2022   History of Present Illness Pt s/p Partial amputationi R hallux at IPS and with hx of DM and CVA (pt reports minimal residual and was able to return to work).    PT Comments    Pt continues very cooperative and mobilizing at MOD I to supervision level including up to bathroom, ambulation in hall and negotiating stairs to enter home.  Pt hopeful for dc home this date.   Recommendations for follow up therapy are one component of a multi-disciplinary discharge planning process, led by the attending physician.  Recommendations may be updated based on patient status, additional functional criteria and insurance authorization.  Follow Up Recommendations  No PT follow up     Assistance Recommended at Discharge Intermittent Supervision/Assistance  Patient can return home with the following Assist for transportation;Help with stairs or ramp for entrance;Assistance with cooking/housework   Equipment Recommendations  None recommended by PT    Recommendations for Other Services       Precautions / Restrictions Precautions Precautions: Fall;Other (comment) Precaution Comments: Post op shoe on R Restrictions Weight Bearing Restrictions: No RLE Weight Bearing: Weight bearing as tolerated     Mobility  Bed Mobility Overal bed mobility: Modified Independent                  Transfers Overall transfer level: Modified independent                      Ambulation/Gait Ambulation/Gait assistance: Supervision Gait Distance (Feet): 150 Feet (twice) Assistive device: Rolling walker (2 wheels) Gait Pattern/deviations: Step-to pattern, Step-through pattern, Decreased step length - right, Decreased step length - left, Shuffle, Trunk flexed       General Gait Details: cues for posture and position from RW   Stairs Stairs: Yes Stairs assistance:  Supervision Stair Management: Two rails, Step to pattern, Forwards Number of Stairs: 2     Wheelchair Mobility    Modified Rankin (Stroke Patients Only)       Balance Overall balance assessment: Mild deficits observed, not formally tested                                          Cognition Arousal/Alertness: Awake/alert Behavior During Therapy: WFL for tasks assessed/performed Overall Cognitive Status: Within Functional Limits for tasks assessed                                          Exercises      General Comments        Pertinent Vitals/Pain Pain Assessment Pain Assessment: No/denies pain    Home Living                          Prior Function            PT Goals (current goals can now be found in the care plan section) Acute Rehab PT Goals Patient Stated Goal: GEt back to driving PT Goal Formulation: With patient Time For Goal Achievement: 05/17/22 Potential to Achieve Goals: Good Progress towards PT goals: Progressing toward goals    Frequency    Min 5X/week      PT Plan Current  plan remains appropriate    Co-evaluation              AM-PAC PT "6 Clicks" Mobility   Outcome Measure  Help needed turning from your back to your side while in a flat bed without using bedrails?: None Help needed moving from lying on your back to sitting on the side of a flat bed without using bedrails?: None Help needed moving to and from a bed to a chair (including a wheelchair)?: A Little Help needed standing up from a chair using your arms (e.g., wheelchair or bedside chair)?: A Little Help needed to walk in hospital room?: A Little Help needed climbing 3-5 steps with a railing? : A Little 6 Click Score: 20    End of Session Equipment Utilized During Treatment: Gait belt Activity Tolerance: Patient tolerated treatment well Patient left: in chair;with call bell/phone within reach;with chair alarm set Nurse  Communication: Mobility status PT Visit Diagnosis: Unsteadiness on feet (R26.81)     Time: 1638-4665 PT Time Calculation (min) (ACUTE ONLY): 20 min  Charges:  $Gait Training: 8-22 mins                     [place] Pager [phone] Office [phone]    Charity Tessier 05/11/2022, 12:29 PM

## 2022-05-11 NOTE — Discharge Summary (Signed)
Physician Discharge Summary  [name] [name] LSL:[id number] DOB: July 06, 1956 DOA: 05/08/2022  PCP: Coral Spikes, DO  Admit date: 05/08/2022 Discharge date: 05/11/2022  Time spent: 35 minutes  Recommendations for Outpatient Follow-up:   RIGHT foot osteomyelitis -s/p R partial hallux amputation at IPJ leve.  Diabetic foot infection Osteomyelitis > Patient presenting from podiatry due to concern for developing osteomyelitis in setting of known diabetic foot infection. > MRI in the ED did confirm osteomyelitis with possible developing septic arthritis. > Patient noted to have leukocytosis to 11.1 but otherwise stable labs and vital signs. > Podiatry consulted recommending n.p.o. at 5 AM and continue with broad-spectrum antibiotics as started in the ED which include vancomycin, ceftriaxone, Flagyl. - Monitor on MedSurg unit -Per podiatry Augmentin 875-125 BID x7 days -Wound care: leave dressing applied bedside today clean dry and intact until follow up in office next week Thursday with D.P.M. Alexander Standiford -WB status: Weightbearing as tolerated in post op shoe, though encouraged to reduce weightbearing amount. -Per PT/OT no follow-up recommended.  DM type II uncontrolled with hyperglycemia -2/20 hemoglobin A1c= 9.8 -Lantus 60 units nightly -Metformin XR 500 mg daily - Follow-up with PCP patient with uncontrolled diabetes.  Patient's surgical site will not heal appropriately with current diabetic control.  PCP will need to make aggressive changes in medication.    Hypertension -Losartan 100 mg daily   Hyperlipidemia -Lipitor 10 mg daily    History of CVA -ASA 81 mg daily    Discharge Diagnoses:  Principal Problem:   Diabetic foot infection ([place]) Active Problems:   Essential hypertension   HLD (hyperlipidemia)   History of CVA (cerebrovascular accident)   Uncontrolled type 2 diabetes mellitus with hyperglycemia ([place])   Osteomyelitis ([place])   Discharge Condition:  Stable  Diet recommendation: Heart healthy/carb modified  Filed Weights   05/08/22 2343 05/09/22 1235  Weight: 74.5 kg 74 kg    History of present illness:  66 y.o. WF PMHx essential HTN, HLD, DM type II uncontrolled with complications, DM Nephropathy, CVA, Hx osteomyelitis.   Presenting with diabetic foot wound.   Patient has known history of diabetic foot wound and follows with podiatry outpatient.  She was seen in the office earlier today and sent to the ED due to concern for developing osteomyelitis of the distal great toe.  She was told plan is for IV antibiotics and possible amputation.   She denies fevers, chills, chest pain, shortness of breath, abdominal pain, constipation, diarrhea, nausea, vomiting.  No significant pain of the toe due to her neuropathy. ED Course: Vital signs in the ED significant for blood pressure in the 115B to 262M systolic.  Lab work-up included BMP with creatinine of 1.21 near baseline of 1.1, glucose 357.  CBC with leukocytosis to 11.1.  Lactic acid normal with repeat pending.  ESR and CRP pending.  Blood cultures pending.  Hospital Course:  See above  Procedures: S/p R partial hallux amputation at IPJ level healing as expected post op  Consultations: Podiatry  D.P.M. Alexander Standiford  Cultures  9/27 wound RIGHT toe I DJ space ALM NGTD 9/27 bone RIGHT distal phalanx amputation NGTD   Antibiotics Anti-infectives (From admission, onward)    Start     Ordered Stop   05/11/22 1200  amoxicillin-clavulanate (AUGMENTIN) 875-125 MG per tablet 1 tablet        05/11/22 1106 05/18/22 0959   05/10/22 1800  vancomycin (VANCOCIN) IVPB 1000 mg/200 mL premix  Status:  Discontinued  05/10/22 1539 05/11/22 1106   05/10/22 0200  vancomycin (VANCOREADY) IVPB 750 mg/150 mL  Status:  Discontinued        05/09/22 0102 05/10/22 1539   05/09/22 0000  vancomycin (VANCOREADY) IVPB 1500 mg/300 mL        05/08/22 2359 05/09/22 0259   05/08/22 2330   cefTRIAXone (ROCEPHIN) 2 g in sodium chloride 0.9 % 100 mL IVPB  Status:  Discontinued        05/08/22 2317 05/11/22 1106   05/08/22 2330  metroNIDAZOLE (FLAGYL) tablet 500 mg  Status:  Discontinued        05/08/22 2317 05/11/22 1106         Discharge Exam: Vitals:   05/10/22 1451 05/10/22 1745 05/10/22 2036 05/11/22 0610  BP: 115/61 (!) 141/60 (!) 129/56 (!) 141/63  Pulse: 61 61 67 (!) 53  Resp: 17 17 18 18   Temp: 97.8 F (36.6 C) 97.8 F (36.6 C) 97.9 F (36.6 C) 97.8 F (36.6 C)  TempSrc: Oral  Oral Oral  SpO2: 100% 100% 99% 98%  Weight:      Height:        General: No acute respiratory distress Lungs: Clear to auscultation bilaterally without wheezes or crackles Cardiovascular: Regular rate and rhythm without murmur gallop or rub normal S1 and S2  Discharge Instructions   Allergies as of 05/11/2022       Reactions   Ace Inhibitors Cough   Glipizide    Glyburide Other (See Comments)   Other reaction(s): hypoglycemia and weight gain   Liraglutide    Other reaction(s): stomach upset   Meclizine Hcl         Medication List     TAKE these medications    acetaminophen 325 MG tablet Commonly known as: TYLENOL Take 2 tablets (650 mg total) by mouth every 6 (six) hours as needed for mild pain (or Fever >/= 101).   amoxicillin-clavulanate 875-125 MG tablet Commonly known as: AUGMENTIN Take 1 tablet by mouth every 12 (twelve) hours.   aspirin EC 81 MG tablet Take 81 mg by mouth daily.   atorvastatin 10 MG tablet Commonly known as: LIPITOR TAKE 1 TABLET BY MOUTH ONCE DAILY AT 6:00 PM What changed:  how much to take how to take this when to take this additional instructions   B-12 PO Take 2 tablets by mouth daily.   insulin glargine 100 UNIT/ML Solostar Pen Commonly known as: LANTUS Inject 60 Units into the skin at bedtime.   losartan 100 MG tablet Commonly known as: COZAAR Take 100 mg by mouth daily.   metFORMIN 500 MG 24 hr tablet Commonly  known as: GLUCOPHAGE-XR Take 500 mg by mouth daily with breakfast.   mupirocin ointment 2 % Commonly known as: BACTROBAN Apply 1 Application topically 2 (two) times daily.   One-A-Day [employer]omens [employer] 1 tablet by mouth daily.   oxyCODONE-acetaminophen 5-325 MG tablet Commonly known as: PERCOCET/ROXICET Take 1 tablet by mouth every 4 (four) hours as needed for moderate pain.   ReliOn Pen Needles 32G X 4 MM Misc Generic drug: Insulin Pen Needle USE ONE  ONCE DAILY       Allergies  Allergen Reactions   Ace Inhibitors Cough   Glipizide    Glyburide Other (See Comments)    Other reaction(s): hypoglycemia and weight gain   Liraglutide     Other reaction(s): stomach upset   Meclizine Hcl     Follow-up Information     [name],  DPM Follow up on 05/17/2022.   Specialty: Podiatry Contact information: [address] [place] [address] [phone]                  The results of significant diagnostics from this hospitalization (including imaging, microbiology, ancillary and laboratory) are listed below for reference.    Significant Diagnostic Studies: DG Foot 2 Views Right  Result Date: 05/09/2022 CLINICAL DATA:  Status post great toe amputation. EXAM: RIGHT FOOT - 2 VIEW COMPARISON:  Right foot x-ray 05/08/2022 FINDINGS: There has been interval amputation of the distal first phalanx. No acute fracture or dislocation. No cortical erosion or periosteal reaction. Soft tissues are within normal limits. Small plantar calcaneal spur is again seen. There are peripheral vascular calcifications. IMPRESSION: Status post amputation of the distal first phalanx. Electronically Signed   By: Ronney Asters M.D.   On: 05/09/2022 15:49   DG Foot Complete Right  Result Date: 05/09/2022 Please see detailed radiograph report in office note.  MR FOOT RIGHT WO CONTRAST  Result Date: 05/08/2022 CLINICAL DATA:  Osteomyelitis suspected EXAM: MRI OF  THE RIGHT FOREFOOT WITHOUT CONTRAST TECHNIQUE: Multiplanar, multisequence MR imaging of the right forefoot was performed. No intravenous contrast was administered. COMPARISON:  Right foot radiographs 05/08/2022 and 02/09/2022 FINDINGS: Bones/Joint/Cartilage There is marrow edema and confluent low T1 signal within the great toe distal phalanx with bony destruction of the phalangeal tuft. Trace great toe IP joint effusion. No other significant marrow signal alteration. Ligaments Intact MTP collateral ligaments.  No evidence of plantar plate tear. Muscles and Tendons Diffuse intramuscular edema and atrophy in the foot as is commonly seen in diabetics. No acute tendon tear. Soft tissues Diffuse soft tissue swelling of the foot most prominent dorsally and along the great toe. There is a great toe ulcer along the distal aspect. IMPRESSION: Distal great toe ulcer with underlying osteomyelitis of the distal phalanx. Trace great toe IP joint effusion which may suggest developing septic arthritis, though there is no significant marrow signal abnormality in the adjacent proximal phalanx at this time. Diffuse soft tissue swelling of the foot. No evidence of soft tissue abscess. Electronically Signed   By: Maurine Simmering M.D.   On: 05/08/2022 20:48    Microbiology: Recent Results (from the past 240 hour(s))  Blood culture (routine x 2)     Status: None (Preliminary result)   Collection Time: 05/08/22 12:04 AM   Specimen: BLOOD  Result Value Ref Range Status   Specimen Description   Final    BLOOD RIGHT ANTECUBITAL Performed at [facility] [address] [place], [place] [address]    Special Requests   Final    BOTTLES DRAWN AEROBIC AND ANAEROBIC Blood Culture results may not be optimal due to an excessive volume of blood received in culture bottles Performed at [facility] [address], [place], [place] [address]    Culture   Final    NO GROWTH 2 DAYS Performed at [facility] [address] [place], [place] [address]    Report Status PENDING  Incomplete  Blood culture (routine x 2)     Status: None (Preliminary result)   Collection Time: 05/08/22  2:50 PM   Specimen: BLOOD  Result Value Ref Range Status   Specimen Description   Final    BLOOD RIGHT ANTECUBITAL Performed at [facility] [address], [place],  [address]    Special Requests  Final    BOTTLES DRAWN AEROBIC AND ANAEROBIC Blood Culture results may not be optimal due to an excessive volume of blood received in culture bottles Performed at [facility] [address], [place], [place] [address]    Culture   Final    NO GROWTH 3 DAYS Performed at [facility], [place] [address] [place], [place] [address]    Report Status PENDING  Incomplete  Aerobic/Anaerobic Culture w Gram Stain (surgical/deep wound)     Status: None (Preliminary result)   Collection Time: 05/09/22  2:01 PM   Specimen: Toe, Right; Amputation  Result Value Ref Range Status   Specimen Description   Final    WOUND RIGHT TOE IDJ ALM Performed at [facility] [address], [place], [place] [address]    Special Requests   Final    NONE Performed at [facility], [place] [address], [place], [place] [address]    Gram Stain NO WBC SEEN NO ORGANISMS SEEN   Final   Culture   Final    NO GROWTH 2 DAYS Performed at [facility], [place] [address], [place], [place] [address]    Report Status PENDING  Incomplete  Aerobic/Anaerobic Culture w Gram Stain (surgical/deep wound)     Status: None (Preliminary result)   Collection Time: 05/09/22  2:05 PM   Specimen: Toe, Right; Amputation  Result Value Ref Range Status   Specimen Description   Final    BONE RIGHT DISTAL PHALANX AMPUTATION Performed at [facility] [address] [place], [place] [address]    Special Requests   Final    NONE Performed at  [facility], [place] [address], [place], [place] [address]    Gram Stain   Final    FEW WBC PRESENT, PREDOMINANTLY PMN NO ORGANISMS SEEN    Culture   Final    NO GROWTH 2 DAYS Performed at [facility] [address], [place], [place] [address]    Report Status PENDING  Incomplete     Labs: Basic Metabolic Panel: Recent Labs  Lab 05/08/22 1450 05/09/22 0518 05/10/22 0756 05/11/22 0344  NA 135 141 138  --   K 4.7 3.6 4.1  --   CL 102 105 109  --   CO2 24 26 22   --   GLUCOSE 357* 144* 180*  --   BUN 21 23 27*  --   CREATININE 1.21* 1.10* 1.00 1.30*  CALCIUM 9.4 9.6 8.9  --   MG  --   --  1.6*  --   PHOS  --   --  2.8  --    Liver Function Tests: Recent Labs  Lab 05/10/22 0756  AST 19  ALT 17  ALKPHOS 47  BILITOT 0.4  PROT 6.8  ALBUMIN 3.0*   No results for input(s): "LIPASE", "AMYLASE" in the last 168 hours. No results for input(s): "AMMONIA" in the last 168 hours. CBC: Recent Labs  Lab 05/08/22 1450 05/09/22 0518 05/10/22 0756  WBC 11.1* 10.9* 15.0*  NEUTROABS 8.7*  --  13.5*  HGB 12.2 12.7 10.8*  HCT 37.9 40.1 33.8*  MCV 85.7 86.4 86.0  PLT 310 330 290   Cardiac Enzymes: No results for input(s): "CKTOTAL", "CKMB", "CKMBINDEX", "TROPONINI" in the last 168 hours. BNP: BNP (last 3 results) No results for input(s): "BNP" in the last 8760 hours.  ProBNP (last 3 results) No results for input(s): "PROBNP" in  the last 8760 hours.  CBG: Recent Labs  Lab 05/10/22 2037 05/11/22 0001 05/11/22 0359 05/11/22 0745 05/11/22 1145  GLUCAP 224* 194* 113* 110* 136*       Signed:  Dia Crawford, MD [facility]

## 2022-05-11 NOTE — Plan of Care (Signed)
  Problem: Activity: Goal: Risk for activity intolerance will decrease Outcome: Progressing   Problem: Pain Managment: Goal: General experience of comfort will improve Outcome: Progressing   

## 2022-05-11 NOTE — Progress Notes (Signed)
Discharge package printed and instructions given to patient. Verbalizes understanding.  

## 2022-05-13 LAB — CULTURE, BLOOD (ROUTINE X 2): Culture: NO GROWTH

## 2022-05-14 LAB — CULTURE, BLOOD (ROUTINE X 2): Culture: NO GROWTH

## 2022-05-14 LAB — AEROBIC/ANAEROBIC CULTURE W GRAM STAIN (SURGICAL/DEEP WOUND)
Culture: NO GROWTH
Culture: NO GROWTH
Gram Stain: NONE SEEN

## 2022-05-17 ENCOUNTER — Ambulatory Visit (INDEPENDENT_AMBULATORY_CARE_PROVIDER_SITE_OTHER): Payer: BC Managed Care – PPO | Admitting: Podiatry

## 2022-05-17 DIAGNOSIS — S98111A Complete traumatic amputation of right great toe, initial encounter: Secondary | ICD-10-CM

## 2022-05-17 DIAGNOSIS — L97512 Non-pressure chronic ulcer of other part of right foot with fat layer exposed: Secondary | ICD-10-CM

## 2022-05-18 ENCOUNTER — Ambulatory Visit: Payer: Medicare Other | Admitting: Podiatry

## 2022-05-19 NOTE — Progress Notes (Signed)
Subjective: No chief complaint on file.   [name] [name] is a 67 y.o. is seen today in office s/p right partial hallux amputation preformed on 123 with Dr. Loel Lofty.  She is not having any pain.  She is kept the dressing intact denies any systemic complaints such as fevers, chills, nausea, vomiting. No calf pain, chest pain, shortness of breath.   Objective: General: No acute distress, AAOx3  DP/PT pulses palpable 2/4, CRT < 3 sec to all digits.  Right foot: Incision is well coapted without any evidence of dehiscence with sutures intact. There is no surrounding erythema, ascending cellulitis, fluctuance, crepitus, malodor, drainage/purulence. There is mild edema around the surgical site. There is no pain along the surgical site.  No other open lesions. No pain with calf compression, swelling, warmth, erythema.   Assessment and Plan:  Status post right partial hallux amputation, doing well with no complications   -Treatment options discussed including all alternatives, risks, and complications -Incisions healing well.  All sutures intact.  Small amount of Betadine was applied followed by dressing.  Discussed that she continues the dressing on the feet otherwise she can leave it intact until follow-up.  May need surgical shoe. -Ice/elevation -Pain medication as needed. -Monitor for any clinical signs or symptoms of infection and DVT/PE and directed to call the office immediately should any occur or go to the ER. -Follow-up as scheduled for possible suture removal or sooner if any problems arise. In the meantime, encouraged to call the office with any questions, concerns, change in symptoms.   Celesta Gentile, DPM

## 2022-05-23 ENCOUNTER — Telehealth: Payer: Self-pay | Admitting: *Deleted

## 2022-05-23 ENCOUNTER — Other Ambulatory Visit: Payer: Self-pay | Admitting: Podiatry

## 2022-05-23 MED ORDER — AMOXICILLIN-POT CLAVULANATE 875-125 MG PO TABS: 1.0000 | ORAL_TABLET | Freq: Two times a day (BID) | ORAL | 0 refills | Status: DC

## 2022-05-23 NOTE — Telephone Encounter (Signed)
Patient is scheduled for tomorrow at 9:45 and she is not currently taking any antibiotics. She stated she finished them.

## 2022-05-23 NOTE — Telephone Encounter (Signed)
Patient is calling because her right toe is not looking good,side of toe is still is bleeding,concerned it may be infected. Please advise.

## 2022-05-24 ENCOUNTER — Ambulatory Visit (INDEPENDENT_AMBULATORY_CARE_PROVIDER_SITE_OTHER): Payer: BC Managed Care – PPO | Admitting: Podiatry

## 2022-05-24 ENCOUNTER — Ambulatory Visit (INDEPENDENT_AMBULATORY_CARE_PROVIDER_SITE_OTHER): Payer: BC Managed Care – PPO

## 2022-05-24 DIAGNOSIS — L97512 Non-pressure chronic ulcer of other part of right foot with fat layer exposed: Secondary | ICD-10-CM | POA: Diagnosis not present

## 2022-05-24 DIAGNOSIS — S98111A Complete traumatic amputation of right great toe, initial encounter: Secondary | ICD-10-CM

## 2022-05-27 NOTE — Progress Notes (Addendum)
Subjective: Chief Complaint  Patient presents with   Diabetic Ulcer    A1c-  BG- 103 (yesterday) Right foot hallux ulcer, drainage, Patient is having some pain, Patient denies N/V/F/C/SOB, X-Rays taken today,    [name] [name] is a 66 y.o. is seen today in office s/p right partial hallux amputation preformed on 123 with Dr. Loel Lofty.  She presents today for an acute appointment.  She thinks that the incision does not look as good as it did last time she was have it checked.  No recent injury or changes.  She still been in the surgical shoe.  Denies any fevers or chills.  Objective: General: No acute distress, AAOx3  DP/PT pulses palpable 2/4, CRT < 3 sec to all digits.  Right foot: Incision is well coapted without any evidence of dehiscence with sutures intact.  Unable to appreciate any drainage or pus.  Slight edema to the toe.  There is dry skin present to the plantar aspect.  No fluctuation or crepitation.  No malodor. No pain with calf compression, swelling, warmth, erythema.          Assessment and Plan:  Status post right partial hallux amputation, doing well with no complications   -Treatment options discussed including all alternatives, risks, and complications -X-rays were obtained and reviewed with the patient.  3 views of the right foot were obtained.  Edema present along the proximal phalanx but no cortical erosions. -Overall incisions intact.  I did go and restart antibiotics and she will pick them up and is started them.  Continue daily dressing changes and surgical shoe.  Elevation.  We will hold off on removing the sutures today. -Monitor for any clinical signs or symptoms of infection and directed to call the office immediately should any occur or go to the ER.  Follow-up next week as scheduled.  [name] DPM

## 2022-05-29 ENCOUNTER — Ambulatory Visit (INDEPENDENT_AMBULATORY_CARE_PROVIDER_SITE_OTHER): Payer: BC Managed Care – PPO

## 2022-05-29 DIAGNOSIS — S98111A Complete traumatic amputation of right great toe, initial encounter: Secondary | ICD-10-CM

## 2022-05-29 NOTE — Progress Notes (Signed)
Patient in the office for suture removal.   Sutures removed from the right hallux without any complication. Patient denies nausea,vomiting, fever and chills at this time. Steri-strips applied to the right hallux followed by DSD. Patient tolerated suture removal well.   Advised patient to continue to monitor for signs and symptoms of infection and call the office with any questions, comments, or concerns.   Patient verbalized understanding.

## 2022-06-11 ENCOUNTER — Ambulatory Visit (INDEPENDENT_AMBULATORY_CARE_PROVIDER_SITE_OTHER): Payer: BC Managed Care – PPO | Admitting: Podiatry

## 2022-06-11 DIAGNOSIS — S98111A Complete traumatic amputation of right great toe, initial encounter: Secondary | ICD-10-CM

## 2022-06-11 DIAGNOSIS — M2031 Hallux varus (acquired), right foot: Secondary | ICD-10-CM | POA: Diagnosis not present

## 2022-06-11 NOTE — Progress Notes (Unsigned)
Subjective: Chief Complaint  Patient presents with   Foot Ulcer    Right hallux amputation, Patient denies any pain, No N/V/F/C/SOB    66 year old female presents the office with above concerns and for proper evaluation after undergoing a right hallux amputation with Dr. Loel Lofty.  States that she is doing well.  No swelling or redness.  No fevers or chills.  States that she is happy she did the surgery and she which she had done this a long time ago.  Objective: General: No acute distress, AAOx3 -presents today wearing surgical shoe DP/PT pulses palpable 2/4, CRT < 3 sec to all digits.  Right foot: Incision is well coapted without any evidence of dehiscence and scar is formed.  Small mount of callus formation is present with some dried blood but upon debridement underlying skin intact with any opening.  No swelling redness or any drainage.  No pain on exam.  No pain with calf compression, swelling, warmth, erythema.      Assessment and Plan:  Status post right partial hallux amputation, doing well with no complications   -Treatment options discussed including all alternatives, risks, and complications -Patient site appears to be healing well.  I still keep a bandage on the area daily and remain in surgical shoe for offloading.  Discussed that with inspection encouraged glucose control again. -Monitor for any clinical signs or symptoms of infection and directed to call the office immediately should any occur or go to the ER.   [name] DPM

## 2022-06-25 ENCOUNTER — Ambulatory Visit: Payer: BC Managed Care – PPO | Admitting: Podiatry

## 2022-06-25 ENCOUNTER — Ambulatory Visit (INDEPENDENT_AMBULATORY_CARE_PROVIDER_SITE_OTHER): Payer: BC Managed Care – PPO | Admitting: Podiatry

## 2022-06-25 DIAGNOSIS — M79674 Pain in right toe(s): Secondary | ICD-10-CM | POA: Diagnosis not present

## 2022-06-25 DIAGNOSIS — B351 Tinea unguium: Secondary | ICD-10-CM

## 2022-06-25 DIAGNOSIS — E1142 Type 2 diabetes mellitus with diabetic polyneuropathy: Secondary | ICD-10-CM

## 2022-06-25 DIAGNOSIS — M79675 Pain in left toe(s): Secondary | ICD-10-CM

## 2022-06-25 DIAGNOSIS — S98111A Complete traumatic amputation of right great toe, initial encounter: Secondary | ICD-10-CM

## 2022-06-25 NOTE — Patient Instructions (Signed)

## 2022-06-27 NOTE — Progress Notes (Signed)
Subjective: Chief Complaint  Patient presents with   Foot Ulcer    Right hallux amputation, Patient denies any pain, No N/V/F/C/SOB    66 year old female presents today for follow-up evaluation status post hallux amputation.  States that she is doing well.  No swelling redness or any drainage.  No open lesions.  Remainder of the nails appear to be thick and discolored.  No swelling redness or drainage.   Objective: General: No acute distress, AAOx3 -presents today wearing surgical shoe DP/PT pulses palpable 2/4, CRT < 3 sec to all digits.  Right foot: Incision is well coapted without any evidence of dehiscence and scar is formed.  1 small area of scabbing present but there is no drainage or pus.  No edema, erythema or any signs of infection noted.  Appears to be healed at this time. Nails are hypertrophic, dystrophic, brittle, discolored, elongated 10. No surrounding redness or drainage. Tenderness nails 1-5 bilaterally except the right hallux which has been partially amputated. No open lesions or pre-ulcerative lesions are identified today. Hammertoes present. No pain with calf compression, swelling, warmth, erythema.   Assessment and Plan:  Status post right partial hallux amputation, doing well with no complications   -Treatment options discussed including all alternatives, risks, and complications -Surgical site is well-healed.  She can start to get to her regular shoe as tolerated.  Discussed importance of daily foot inspection particularly in the second toe given the hammertoes they will help prevent infection no further lesions develop. -Sharply debrided nails x9 without any complications or bleeding. -Monitor for any clinical signs or symptoms of infection and directed to call the office immediately should any occur or go to the ER.   Vivi Barrack DPM

## 2022-09-28 ENCOUNTER — Ambulatory Visit: Payer: Medicare Other | Admitting: Podiatry

## 2022-10-05 ENCOUNTER — Other Ambulatory Visit: Payer: Self-pay | Admitting: Family Medicine

## 2022-10-15 ENCOUNTER — Ambulatory Visit: Payer: BC Managed Care – PPO | Admitting: Podiatry

## 2022-10-15 DIAGNOSIS — B351 Tinea unguium: Secondary | ICD-10-CM | POA: Diagnosis not present

## 2022-10-15 DIAGNOSIS — M79674 Pain in right toe(s): Secondary | ICD-10-CM

## 2022-10-15 DIAGNOSIS — E1142 Type 2 diabetes mellitus with diabetic polyneuropathy: Secondary | ICD-10-CM

## 2022-10-15 DIAGNOSIS — M79675 Pain in left toe(s): Secondary | ICD-10-CM | POA: Diagnosis not present

## 2022-10-15 DIAGNOSIS — Z794 Long term (current) use of insulin: Secondary | ICD-10-CM

## 2022-10-15 NOTE — Progress Notes (Signed)
Subjective: Chief Complaint  Patient presents with   routine foot care   67 year old female with the above concerns.  She is not reporting any ulcerations.  She does report numbness to her feet which is not new.  No swelling redness or drainage to the toenails.  No other concerns.  Objective: AAO x3, NAD DP/PT pulses palpable bilaterally, CRT less than 3 seconds Incision from prior surgery in the right hallux is well-healed. Nails are hypertrophic, dystrophic, brittle, discolored, elongated 9. No surrounding redness or drainage. 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  Left hallux toenail is loose distally.  Here proximally.  No signs of infection.  No open lesions or pre-ulcerative lesions are identified today. No pain with calf compression, swelling, warmth, erythema  Assessment: Symptomatic onychomycosis  Plan: -All treatment options discussed with the patient including all alternatives, risks, complications.  -Debrided nails x 9 without any complications or bleeding.  Unable to remove the loose nail left hallux toenail.  Monitor for any signs or symptoms of infection. -Discussed neuropathy.  Will monitor numbness.  Glucose control. -Monitor for any clinical signs or symptoms of infection and directed to call the office immediately should any occur or go to the ER.  Return in about 3 months (around 01/15/2023).  [name] DPM

## 2022-10-16 ENCOUNTER — Other Ambulatory Visit: Payer: Self-pay | Admitting: Family Medicine

## 2022-10-16 DIAGNOSIS — Z1231 Encounter for screening mammogram for malignant neoplasm of breast: Secondary | ICD-10-CM

## 2022-10-29 ENCOUNTER — Ambulatory Visit
Admission: RE | Admit: 2022-10-29 | Discharge: 2022-10-29 | Disposition: A | Discharge status: Home / Self Care | Payer: BC Managed Care – PPO | Source: Ambulatory Visit | Attending: Family Medicine | Admitting: Family Medicine

## 2022-10-29 DIAGNOSIS — Z1231 Encounter for screening mammogram for malignant neoplasm of breast: Secondary | ICD-10-CM

## 2023-01-17 ENCOUNTER — Ambulatory Visit: Payer: BC Managed Care – PPO | Admitting: Nurse Practitioner

## 2023-01-17 ENCOUNTER — Ambulatory Visit: Payer: BC Managed Care – PPO | Admitting: Podiatry

## 2023-01-17 VITALS — BP 150/70 | HR 73 | Ht 62.0 in | Wt 166.2 lb

## 2023-01-17 DIAGNOSIS — E1142 Type 2 diabetes mellitus with diabetic polyneuropathy: Secondary | ICD-10-CM

## 2023-01-17 DIAGNOSIS — I10 Essential (primary) hypertension: Secondary | ICD-10-CM | POA: Diagnosis not present

## 2023-01-17 DIAGNOSIS — B351 Tinea unguium: Secondary | ICD-10-CM

## 2023-01-17 DIAGNOSIS — Z1211 Encounter for screening for malignant neoplasm of colon: Secondary | ICD-10-CM | POA: Diagnosis not present

## 2023-01-17 DIAGNOSIS — M79675 Pain in left toe(s): Secondary | ICD-10-CM | POA: Diagnosis not present

## 2023-01-17 DIAGNOSIS — M79674 Pain in right toe(s): Secondary | ICD-10-CM | POA: Diagnosis not present

## 2023-01-17 MED ORDER — LOSARTAN POTASSIUM 100 MG PO TABS: 100.0000 mg | ORAL_TABLET | Freq: Every day | ORAL | 1 refills | Status: DC

## 2023-01-17 NOTE — Progress Notes (Signed)
RM   Subjective:    Patient ID: BRISTAL STEFFY, female    DOB: 01/23/1956, 67 y.o.   MRN: [id number]  HPI Patient arrives today medication follow up. Patient states she needs refill losartan.  Sees endocrinology for her diabetes.  Recently started on Farxiga 10 mg daily.  Had lab work done this morning.  Results unavailable.  Is scheduled for eye exam in September. Recently bought a new blood pressure machine for home.  BP running around 150/78.  Patient had several vaccines including pneumonia and shingles vaccine done through her work which she is now retired.  This record is not available during this visit. Her specialist has also ordered her DEXA scan.  Gets regular mammograms. Would like to schedule a screening colonoscopy. Is due for her preventive health exam. 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  PMH also includes stroke.  Review of Systems  Constitutional:  Positive for fatigue.  Respiratory:  Negative for cough, chest tightness and shortness of breath.   Cardiovascular:  Negative for chest pain and leg swelling.       Localized swelling around the right ankle at times due to previous history of injury.  No generalized lower extremity edema.       Objective:   Physical Exam NAD.  Alert, oriented.  Calm cheerful affect.  Lungs clear.  Heart regular rate rhythm.  Lower extremities no significant edema. Diabetic Foot Exam - Simple   Simple Foot Form  01/17/2023  3:40 PM  Visual Inspection See comments: Yes Sensation Testing See comments: Yes Pulse Check See comments: Yes Comments DP pulses present bilaterally.  Normal capillary refill.  Significant varicose veins noted in the lower legs and feet.  Skin intact.  Decreased monofilament testing especially in the left toes.  Partial amputation of the right great toe noted.    Today's Vitals   01/17/23 1532  BP: (!) 150/70  Pulse: 73  SpO2: 98%  Weight: 166 lb 3.2 oz (75.4 kg)   Height: 5\' 2"  (1.575 m)   Body mass index is 30.4 kg/m.        Assessment & Plan:   Problem List Items Addressed This Visit       Cardiovascular and Mediastinum   Essential hypertension - Primary   Relevant Medications   losartan (COZAAR) 100 MG tablet     Endocrine   Polyneuropathy due to type 2 diabetes mellitus (HCC)   Relevant Medications   losartan (COZAAR) 100 MG tablet   dapagliflozin propanediol (FARXIGA) 10 MG TABS tablet   Other Visit Diagnoses     Screen for colon cancer       Relevant Orders   Ambulatory referral to Gastroenterology      Meds ordered this encounter  Medications   losartan (COZAAR) 100 MG tablet    Sig: Take 1 tablet (100 mg total) by mouth daily.    Dispense:  90 tablet    Refill:  1    Order Specific Question:   Supervising Provider    Answer:   Lilyan Punt A [[id number]]   Continue losartan as directed.  Continue to monitor BP at home. Referred for screening colonoscopy. Requested patient get [place] a copy of her lab work that was done today. Will check to see if we can get a release of records for [employer] to get vaccine records. Recommend preventive health physical. Follow-up in September for her eye exam. Otherwise follow-up in December with Dr. Adriana Simas as planned.

## 2023-01-17 NOTE — Progress Notes (Signed)
Subjective: Chief Complaint  Patient presents with   Nail Problem    Nail trim    67 year old female with the above concerns.  States she been doing well.  No open lesions.  No pain along the nails and they need to be trimmed. No other concerns.   Objective: AAO x3, NAD DP/PT pulses palpable bilaterally, CRT less than 3 seconds Incision from prior surgery in the right hallux is well-healed. Nails are hypertrophic, dystrophic, brittle, discolored, elongated 9. No surrounding redness or drainage. 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  Left hallux toenail is loose distally.  Here proximally.  No signs of infection.  No open lesions or pre-ulcerative lesions are identified today. No pain with calf compression, swelling, warmth, erythema  Assessment: Symptomatic onychomycosis  Plan: -All treatment options discussed with the patient including all alternatives, risks, complications.  -Debrided nails x 9 without any complications or bleeding.  Unable to remove the loose nail left hallux toenail.  Monitor for any signs or symptoms of infection. -Continue to monitor neuropathy -Daily foot inspection and glucose control -Monitor for any clinical signs or symptoms of infection and directed to call the office immediately should any occur or go to the ER.  Return in about 3 months (around 04/19/2023) for ROUTINE CARE.  Vivi Barrack DPM

## 2023-01-18 ENCOUNTER — Encounter: Payer: Self-pay | Admitting: Nurse Practitioner

## 2023-01-22 ENCOUNTER — Encounter: Payer: Self-pay | Admitting: *Deleted

## 2023-04-16 ENCOUNTER — Ambulatory Visit: Payer: BC Managed Care – PPO | Admitting: Podiatry

## 2023-04-16 DIAGNOSIS — E1142 Type 2 diabetes mellitus with diabetic polyneuropathy: Secondary | ICD-10-CM

## 2023-04-16 DIAGNOSIS — Z794 Long term (current) use of insulin: Secondary | ICD-10-CM

## 2023-04-16 DIAGNOSIS — M79675 Pain in left toe(s): Secondary | ICD-10-CM

## 2023-04-16 DIAGNOSIS — M79674 Pain in right toe(s): Secondary | ICD-10-CM

## 2023-04-16 DIAGNOSIS — M792 Neuralgia and neuritis, unspecified: Secondary | ICD-10-CM

## 2023-04-16 DIAGNOSIS — B351 Tinea unguium: Secondary | ICD-10-CM

## 2023-04-16 MED ORDER — GABAPENTIN 100 MG PO CAPS: 100.0000 mg | ORAL_CAPSULE | Freq: Every day | ORAL | 0 refills | Status: DC

## 2023-04-16 NOTE — Patient Instructions (Signed)
Gabapentin Capsules or Tablets What is this medication? GABAPENTIN (GA ba pen tin) treats nerve pain. It may also be used to prevent and control seizures in people with epilepsy. It works by calming overactive nerves in your body. This medicine may be used for other purposes; ask your health care provider or pharmacist if you have questions. COMMON BRAND NAME(S): Active-PAC with Gabapentin, Gabarone, Gralise, Neurontin What should I tell my care team before I take this medication? They need to know if you have any of these conditions: Kidney disease Lung or breathing disease Substance use disorder Suicidal thoughts, plans, or attempt by you or a family member An unusual or allergic reaction to gabapentin, other medications, foods, dyes, or preservatives Pregnant or trying to get pregnant Breastfeeding How should I use this medication? Take this medication by mouth with a glass of water. Follow the directions on the prescription label. You can take it with or without food. If it upsets your stomach, take it with food. Take your medication at regular intervals. Do not take it more often than directed. Do not stop taking except on your care team's advice. If you are directed to break the 600 or 800 mg tablets in half as part of your dose, the extra half tablet should be used for the next dose. If you have not used the extra half tablet within 28 days, it should be thrown away. A special MedGuide will be given to you by the pharmacist with each prescription and refill. Be sure to read this information carefully each time. Talk to your care team about the use of this medication in children. While this medication may be prescribed for children as young as 3 years for selected conditions, precautions do apply. Overdosage: If you think you have taken too much of this medicine contact a poison control center or emergency room at once. NOTE: This medicine is only for you. Do not share this medicine with  others. What if I miss a dose? If you miss a dose, take it as soon as you can. If it is almost time for your next dose, take only that dose. Do not take double or extra doses. What may interact with this medication? Alcohol Antihistamines for allergy, cough, and cold Certain medications for anxiety or sleep Certain medications for depression like amitriptyline, fluoxetine, sertraline Certain medications for seizures like phenobarbital, primidone Certain medications for stomach problems General anesthetics like halothane, isoflurane, methoxyflurane, propofol Local anesthetics like lidocaine, pramoxine, tetracaine Medications that relax muscles for surgery Opioid medications for pain Phenothiazines like chlorpromazine, mesoridazine, prochlorperazine, thioridazine This list may not describe all possible interactions. Give your health care provider a list of all the medicines, herbs, non-prescription drugs, or dietary supplements you use. Also tell them if you smoke, drink alcohol, or use illegal drugs. Some items may interact with your medicine. What should I watch for while using this medication? Visit your care team for regular checks on your progress. You may want to keep a record at home of how you feel your condition is responding to treatment. You may want to share this information with your care team at each visit. You should contact your care team if your seizures get worse or if you have any new types of seizures. Do not stop taking this medication or any of your seizure medications unless instructed by your care team. Stopping your medication suddenly can increase your seizures or their severity. This medication may cause serious skin reactions. They can happen weeks to   months after starting the medication. Contact your care team right away if you notice fevers or flu-like symptoms with a rash. The rash may be red or purple and then turn into blisters or peeling of the skin. Or, you might  notice a red rash with swelling of the face, lips or lymph nodes in your neck or under your arms. Wear a medical identification bracelet or chain if you are taking this medication for seizures. Carry a card that lists all your medications. This medication may affect your coordination, reaction time, or judgment. Do not drive or operate machinery until you know how this medication affects you. Sit up or stand slowly to reduce the risk of dizzy or fainting spells. Drinking alcohol with this medication can increase the risk of these side effects. Your mouth may get dry. Chewing sugarless gum or sucking hard candy, and drinking plenty of water may help. Watch for new or worsening thoughts of suicide or depression. This includes sudden changes in mood, behaviors, or thoughts. These changes can happen at any time but are more common in the beginning of treatment or after a change in dose. Call your care team right away if you experience these thoughts or worsening depression. If you become pregnant while using this medication, you may enroll in the [place] American Antiepileptic Drug Pregnancy Registry by calling 1-888-233-2334. This registry collects information about the safety of antiepileptic medication use during pregnancy. What side effects may I notice from receiving this medication? Side effects that you should report to your care team as soon as possible: Allergic reactions or angioedema--skin rash, itching, hives, swelling of the face, eyes, lips, tongue, arms, or legs, trouble swallowing or breathing Rash, fever, and swollen lymph nodes Thoughts of suicide or self harm, worsening mood, feelings of depression Trouble breathing Unusual changes in mood or behavior in children after use such as difficulty concentrating, hostility, or restlessness Side effects that usually do not require medical attention (report to your care team if they continue or are  bothersome): Dizziness Drowsiness Nausea Swelling of ankles, feet, or hands Vomiting This list may not describe all possible side effects. Call your doctor for medical advice about side effects. You may report side effects to FDA at 1-800-FDA-1088. Where should I keep my medication? Keep out of reach of children and pets. Store at room temperature between 15 and 30 degrees C (59 and 86 degrees F). Get rid of any unused medication after the expiration date. This medication may cause accidental overdose and death if taken by other adults, children, or pets. To get rid of medications that are no longer needed or have expired: Take the medication to a medication take-back program. Check with your pharmacy or law enforcement to find a location. If you cannot return the medication, check the label or package insert to see if the medication should be thrown out in the garbage or flushed down the toilet. If you are not sure, ask your care team. If it is safe to put it in the trash, empty the medication out of the container. Mix the medication with cat litter, dirt, coffee grounds, or other unwanted substance. Seal the mixture in a bag or container. Put it in the trash. NOTE: This sheet is a summary. It may not cover all possible information. If you have questions about this medicine, talk to your doctor, pharmacist, or health care provider.  2024 Elsevier/Gold Standard (2022-05-15 00:00:00)  

## 2023-04-16 NOTE — Progress Notes (Signed)
Subjective: Chief Complaint  Patient presents with   Nail Problem    Pt present something for a routine foot care.    67 year old female with the above concerns.  States she been doing well.  She does not report any new ulcerations.  She is still neuropathy getting worse.   Objective: AAO x3, NAD DP/PT pulses palpable bilaterally, CRT less than 3 seconds Incision from prior surgery in the right hallux is well-healed. Nails are hypertrophic, dystrophic, brittle, discolored, elongated 9. No surrounding redness or drainage. 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 No signs of infection.  No open lesions or pre-ulcerative lesions are identified today. No pain with calf compression, swelling, warmth, erythema  Assessment: Symptomatic onychomycosis  Plan: -All treatment options discussed with the patient including all alternatives, risks, complications.  -Debrided nails x 9 without any complications or bleeding.   -Continue to monitor neuropathy-she is interested in treatment.  She feels this is getting worse.  Prescribed gabapentin.  Discussed this is likely slowly progressive and there is no cure for neuropathy.  Discussed side effects of the gabapentin and she is in monitor this.  Will start 100 mg at nighttime and titrate up as needed. -Daily foot inspection and glucose control -Monitor for any clinical signs or symptoms of infection and directed to call the office immediately should any occur or go to the ER.  Return in about 3 months (around 07/16/2023).  Vivi Barrack DPM

## 2023-04-18 ENCOUNTER — Ambulatory Visit: Payer: Medicare Other | Admitting: Podiatry

## 2023-04-24 LAB — HM DIABETES EYE EXAM

## 2023-05-17 ENCOUNTER — Other Ambulatory Visit: Payer: Self-pay | Admitting: Family Medicine

## 2023-05-17 DIAGNOSIS — Z1212 Encounter for screening for malignant neoplasm of rectum: Secondary | ICD-10-CM

## 2023-05-17 DIAGNOSIS — Z1211 Encounter for screening for malignant neoplasm of colon: Secondary | ICD-10-CM

## 2023-06-09 LAB — COLOGUARD: COLOGUARD: NEGATIVE

## 2023-06-27 ENCOUNTER — Encounter: Payer: Self-pay | Admitting: Family Medicine

## 2023-06-27 NOTE — Telephone Encounter (Signed)
Care team updated and letter sent for eye exam notes.

## 2023-07-18 ENCOUNTER — Encounter: Payer: Self-pay | Admitting: Podiatry

## 2023-07-18 ENCOUNTER — Ambulatory Visit (INDEPENDENT_AMBULATORY_CARE_PROVIDER_SITE_OTHER): Payer: Medicare Other | Admitting: Podiatry

## 2023-07-18 ENCOUNTER — Ambulatory Visit: Payer: BC Managed Care – PPO | Admitting: Family Medicine

## 2023-07-18 VITALS — BP 148/60 | HR 94 | Temp 97.2°F | Wt 164.0 lb

## 2023-07-18 DIAGNOSIS — B351 Tinea unguium: Secondary | ICD-10-CM

## 2023-07-18 DIAGNOSIS — M79675 Pain in left toe(s): Secondary | ICD-10-CM | POA: Diagnosis not present

## 2023-07-18 DIAGNOSIS — Z23 Encounter for immunization: Secondary | ICD-10-CM | POA: Diagnosis not present

## 2023-07-18 DIAGNOSIS — E7800 Pure hypercholesterolemia, unspecified: Secondary | ICD-10-CM

## 2023-07-18 DIAGNOSIS — R011 Cardiac murmur, unspecified: Secondary | ICD-10-CM

## 2023-07-18 DIAGNOSIS — E1142 Type 2 diabetes mellitus with diabetic polyneuropathy: Secondary | ICD-10-CM

## 2023-07-18 DIAGNOSIS — E1165 Type 2 diabetes mellitus with hyperglycemia: Secondary | ICD-10-CM

## 2023-07-18 DIAGNOSIS — D649 Anemia, unspecified: Secondary | ICD-10-CM

## 2023-07-18 DIAGNOSIS — M79674 Pain in right toe(s): Secondary | ICD-10-CM

## 2023-07-18 DIAGNOSIS — Z794 Long term (current) use of insulin: Secondary | ICD-10-CM

## 2023-07-18 DIAGNOSIS — I10 Essential (primary) hypertension: Secondary | ICD-10-CM

## 2023-07-18 MED ORDER — INSULIN LISPRO (1 UNIT DIAL) 100 UNIT/ML (KWIKPEN): 4.0000 [IU] | PEN_INJECTOR | Freq: Three times a day (TID) | SUBCUTANEOUS | Status: AC

## 2023-07-18 MED ORDER — METFORMIN HCL ER 500 MG PO TB24: 500.0000 mg | ORAL_TABLET | Freq: Two times a day (BID) | ORAL | 3 refills | Status: DC

## 2023-07-18 MED ORDER — FREESTYLE LIBRE 3 READER DEVI: 0 refills | Status: AC

## 2023-07-18 MED ORDER — FREESTYLE LIBRE 3 SENSOR MISC: 3 refills | Status: DC

## 2023-07-18 NOTE — Progress Notes (Signed)
Subjective:  Patient ID: [name] [name], female    DOB: [birth year]  Age: 67 y.o. MRN: [id number]  CC:   Chief Complaint  Patient presents with   Hypertension    Discuss losartan    HPI:  67 year old female with type 2 diabetes with complications, hyperlipidemia, history of stroke, hypertension presents for follow-up.  It appears that noncompliance is the main issue.  Patient states that she sees endocrinology at [facility] but is not taking her insulin as prescribed.  She states that she frequently decreases the dose of her Lantus to avoid hypoglycemia.  She is takes anywhere from 20-60.  She takes 4 units of fast acting insulin (lispro).  She is managed by endocrinology.  No recent labs available to me.  She states her last A1c was uncontrolled.  She is also on metformin and [place].  Prior GLP-1 use with Victoza.  No recent GLP-1 use.  Patient does not have her glucose readings with her.  Is not currently using a CGM.  Will discuss these issues today.  BP elevated here today.  Patient states that she feels like the losartan is not working.  She would like to discuss change in therapy.  Unsure of the status of her lipids.  Last lipid panel in our EMR was in February 2023.  LDL at that time was 65.  She is on atorvastatin.  Patient Active Problem List   Diagnosis Date Noted   Uncontrolled type 2 diabetes mellitus with hyperglycemia (HCC) 10/02/2021   Diabetic retinopathy associated with type 2 diabetes mellitus (HCC) 12/22/2020   History of CVA (cerebrovascular accident) 12/22/2020   Long term (current) use of insulin (HCC) 12/22/2020   Polyneuropathy due to type 2 diabetes mellitus (HCC) 12/22/2020   Heart murmur, systolic 06/27/2020   HLD (hyperlipidemia) 04/18/2018   Essential hypertension 02/24/2018   History of osteomyelitis- rt great toe 04/22/2016    Social Hx   Social History   Socioeconomic History   Marital status: Married    Spouse name: Educational psychologist   Number of children: 2    Years of education: Not on file   Highest education level: Some college, no degree  Occupational History    Employer: [employer]  Tobacco Use   Smoking status: Never    Passive exposure: Past   Smokeless tobacco: Never  Substance and Sexual Activity   Alcohol use: No   Drug use: No   Sexual activity: Not on file  Other Topics Concern   Not on file  Social History Narrative   Patient is left-handed. She lives with her husband in a one level home with a basement. She occasionally drinks coffee and has 2-3 diest sodas a day. She does not exercise.   Social Determinants of Health   Financial Resource Strain: Not on file  Food Insecurity: No Food Insecurity (05/09/2022)   Hunger Vital Sign    Worried About Running Out of Food in the Last Year: Never true    Ran Out of Food in the Last Year: Never true  Transportation Needs: No Transportation Needs (05/09/2022)   PRAPARE - Administrator, Civil Service (Medical): No    Lack of Transportation (Non-Medical): No  Physical Activity: Not on file  Stress: Not on file  Social Connections: Unknown (12/24/2021)   Received from [facility]   Social Network    Social Network: Not on file    Review of Systems Per HPI  Objective:  BP (!) 148/60  Pulse 94   Temp (!) 97.2 F (36.2 C)   Wt 164 lb (74.4 kg)   SpO2 97%   BMI 30.00 kg/m      07/18/2023   10:40 AM 01/17/2023    3:32 PM 05/11/2022    1:06 PM  BP/Weight  Systolic BP 148 150 190  Diastolic BP 60 70 71  Wt. (Lbs) 164 166.2   BMI 30 kg/m2 30.4 kg/m2     Physical Exam Constitutional:      General: She is not in acute distress.    Appearance: Normal appearance.  HENT:     Head: Normocephalic and atraumatic.  Cardiovascular:     Rate and Rhythm: Normal rate and regular rhythm.     Heart sounds: Murmur heard.  Pulmonary:     Effort: Pulmonary effort is normal.     Breath sounds: Normal breath sounds.  Neurological:     Mental Status: She is  alert.     Lab Results  Component Value Date   WBC 15.0 (H) 05/10/2022   HGB 10.8 (L) 05/10/2022   HCT 33.8 (L) 05/10/2022   PLT 290 05/10/2022   GLUCOSE 180 (H) 05/10/2022   CHOL 156 10/02/2021   TRIG 205 (H) 10/02/2021   HDL 58 10/02/2021   LDLCALC 65 10/02/2021   ALT 17 05/10/2022   AST 19 05/10/2022   NA 138 05/10/2022   K 4.1 05/10/2022   CL 109 05/10/2022   CREATININE 1.30 (H) 05/11/2022   BUN 27 (H) 05/10/2022   CO2 22 05/10/2022   TSH 1.960 02/24/2018   INR 1.1 03/09/2019   HGBA1C 9.8 (H) 10/02/2021     Assessment & Plan:   Problem List Items Addressed This Visit       Cardiovascular and Mediastinum   Essential hypertension    Uncontrolled.  Awaiting metabolic panel before change in antihypertensive therapy.  She will continue losartan for now.  My anticipation is that I will add HCTZ.  However, I need to see her metabolic panel before doing so.      Relevant Medications   atorvastatin (LIPITOR) 40 MG tablet     Endocrine   Uncontrolled type 2 diabetes mellitus with hyperglycemia (HCC) - Primary    Uncontrolled.  Obtaining labs today.  Sending in Rx for continuous glucometer.  Metformin refilled.  We had a lengthy discussion today about compliance with her insulin regimen.  Referring to [facility] endocrinology.      Relevant Medications   atorvastatin (LIPITOR) 40 MG tablet   metFORMIN (GLUCOPHAGE-XR) 500 MG 24 hr tablet   insulin lispro (HUMALOG) 100 UNIT/ML KwikPen   Other Relevant Orders   CMP14+EGFR   Hemoglobin A1c   Microalbumin / creatinine urine ratio   Ambulatory referral to Endocrinology     Other   HLD (hyperlipidemia)    Lipid panel to assess.  Continue statin.      Relevant Medications   atorvastatin (LIPITOR) 40 MG tablet   Other Relevant Orders   Lipid panel   Heart murmur, systolic    Needs updated echo.      Relevant Orders   ECHOCARDIOGRAM COMPLETE   Other Visit Diagnoses     Immunization due       Relevant Orders    Flu Vaccine Trivalent High Dose (Fluad) (Completed)   Pneumococcal conjugate vaccine 20-valent (Prevnar 20) (Completed)   Anemia, unspecified type       Relevant Orders   CBC       Meds ordered this  encounter  Medications   metFORMIN (GLUCOPHAGE-XR) 500 MG 24 hr tablet    Sig: Take 1 tablet (500 mg total) by mouth 2 (two) times daily with a meal.    Dispense:  180 tablet    Refill:  3   Continuous Glucose Sensor (FREESTYLE LIBRE 3 SENSOR) MISC    Sig: Place 1 sensor on the skin every 14 days. Use to check glucose continuously    Dispense:  6 each    Refill:  3   Continuous Glucose Receiver (FREESTYLE LIBRE 3 READER) DEVI    Sig: Use to check blood sugar continuously.    Dispense:  1 each    Refill:  0   insulin lispro (HUMALOG) 100 UNIT/ML KwikPen    Sig: Inject 4 Units into the skin with breakfast, with lunch, and with evening meal.    Follow-up:  Return in about 3 months (around 10/16/2023) for Follow up Chronic medical issues.  Everlene Other DO [facility]: [facility]

## 2023-07-18 NOTE — Assessment & Plan Note (Signed)
 Lipid panel to assess. Continue statin.

## 2023-07-18 NOTE — Assessment & Plan Note (Signed)
Uncontrolled.  Awaiting metabolic panel before change in antihypertensive therapy.  She will continue losartan for now.  My anticipation is that I will add HCTZ.  However, I need to see her metabolic panel before doing so.

## 2023-07-18 NOTE — Assessment & Plan Note (Signed)
Needs updated echo.

## 2023-07-18 NOTE — Progress Notes (Signed)
Subjective: Chief Complaint  Patient presents with   [facility]    RM#11 [facility] Struggling keeping blood levels done had appt with PCP today.    67 year old female with the above concerns.  States she been doing well.  She does not report any new ulcerations.  She has no new concerns today.  No open lesions.   Objective: AAO x3, NAD DP/PT pulses palpable bilaterally, CRT less than 3 seconds Incision from prior surgery in the right hallux is well-healed. Nails are hypertrophic, dystrophic, brittle, discolored, elongated 9.  There was fresh blood in the left hallux toenail she recently tried cutting the toenails this morning.  There is no purulence.  No surrounding redness or drainage. 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 No signs of infection.  No open lesions or pre-ulcerative lesions are identified today. No pain with calf compression, swelling, warmth, erythema  Assessment: Symptomatic onychomycosis  Plan: -All treatment options discussed with the patient including all alternatives, risks, complications.  -Debrided nails x 9 without any complications however there was some bleeding on the left hallux from where she tried cutting the nail this morning.  Recommend antibiotic ointment dressing changes daily.  Monitoring signs or symptoms of infection. -Gabapentin for neuropathy -Daily foot inspection and glucose control -Monitor for any clinical signs or symptoms of infection and directed to call the office immediately should any occur or go to the ER.  Return in about 9 weeks (around 09/19/2023) for routine care.  Vivi Barrack DPM

## 2023-07-18 NOTE — Assessment & Plan Note (Signed)
Uncontrolled.  Obtaining labs today.  Sending in Rx for continuous glucometer.  Metformin refilled.  We had a lengthy discussion today about compliance with her insulin regimen.  Referring to [facility] endocrinology.

## 2023-07-18 NOTE — Patient Instructions (Addendum)
Labs today.  Rx sent for CGM.  I will change medication once I see your labs.  Follow up in 3 months.

## 2023-07-20 LAB — CBC
Hematocrit: 42.2 % (ref 34.0–46.6)
Hemoglobin: 13.4 g/dL (ref 11.1–15.9)
MCH: 27 pg (ref 26.6–33.0)
MCHC: 31.8 g/dL (ref 31.5–35.7)
MCV: 85 fL (ref 79–97)
Platelets: 266 10*3/uL (ref 150–450)
RBC: 4.96 x10E6/uL (ref 3.77–5.28)
RDW: 14.9 % (ref 11.7–15.4)
WBC: 11.2 10*3/uL — ABNORMAL HIGH (ref 3.4–10.8)

## 2023-07-20 LAB — HEMOGLOBIN A1C
Est. average glucose Bld gHb Est-mCnc: 194 mg/dL
Hgb A1c MFr Bld: 8.4 % — ABNORMAL HIGH (ref 4.8–5.6)

## 2023-07-20 LAB — CMP14+EGFR
ALT: 16 [IU]/L (ref 0–32)
AST: 22 [IU]/L (ref 0–40)
Albumin: 4.2 g/dL (ref 3.9–4.9)
Alkaline Phosphatase: 79 [IU]/L (ref 44–121)
BUN/Creatinine Ratio: 19 (ref 12–28)
BUN: 21 mg/dL (ref 8–27)
Bilirubin Total: 0.3 mg/dL (ref 0.0–1.2)
CO2: 21 mmol/L (ref 20–29)
Calcium: 9.9 mg/dL (ref 8.7–10.3)
Chloride: 102 mmol/L (ref 96–106)
Creatinine, Ser: 1.12 mg/dL — ABNORMAL HIGH (ref 0.57–1.00)
Globulin, Total: 2.9 g/dL (ref 1.5–4.5)
Glucose: 87 mg/dL (ref 70–99)
Potassium: 4.1 mmol/L (ref 3.5–5.2)
Sodium: 139 mmol/L (ref 134–144)
Total Protein: 7.1 g/dL (ref 6.0–8.5)
eGFR: 53 mL/min/{1.73_m2} — ABNORMAL LOW (ref >59)

## 2023-07-20 LAB — MICROALBUMIN / CREATININE URINE RATIO
Creatinine, Urine: 104.7 mg/dL
Microalb/Creat Ratio: 73 mg/g{creat} — ABNORMAL HIGH (ref 0–29)
Microalbumin, Urine: 76.1 ug/mL

## 2023-07-20 LAB — LIPID PANEL
Chol/HDL Ratio: 2.2 {ratio} (ref 0.0–4.4)
Cholesterol, Total: 126 mg/dL (ref 100–199)
HDL: 58 mg/dL (ref >39)
LDL Chol Calc (NIH): 41 mg/dL (ref 0–99)
Triglycerides: 168 mg/dL — ABNORMAL HIGH (ref 0–149)
VLDL Cholesterol Cal: 27 mg/dL (ref 5–40)

## 2023-07-22 ENCOUNTER — Other Ambulatory Visit: Payer: Self-pay | Admitting: Nurse Practitioner

## 2023-07-23 ENCOUNTER — Encounter: Payer: Self-pay | Admitting: *Deleted

## 2023-07-25 ENCOUNTER — Encounter: Payer: Self-pay | Admitting: *Deleted

## 2023-09-20 ENCOUNTER — Ambulatory Visit: Payer: Medicare Other | Admitting: Podiatry

## 2023-09-30 ENCOUNTER — Other Ambulatory Visit: Payer: Self-pay | Admitting: Family Medicine

## 2023-09-30 DIAGNOSIS — Z1231 Encounter for screening mammogram for malignant neoplasm of breast: Secondary | ICD-10-CM

## 2023-10-04 ENCOUNTER — Encounter: Payer: Self-pay | Admitting: Podiatry

## 2023-10-04 ENCOUNTER — Ambulatory Visit: Payer: BC Managed Care – PPO | Admitting: Podiatry

## 2023-10-04 DIAGNOSIS — E1142 Type 2 diabetes mellitus with diabetic polyneuropathy: Secondary | ICD-10-CM

## 2023-10-04 DIAGNOSIS — M79675 Pain in left toe(s): Secondary | ICD-10-CM

## 2023-10-04 DIAGNOSIS — M79674 Pain in right toe(s): Secondary | ICD-10-CM | POA: Diagnosis not present

## 2023-10-04 DIAGNOSIS — B351 Tinea unguium: Secondary | ICD-10-CM | POA: Diagnosis not present

## 2023-10-04 DIAGNOSIS — L84 Corns and callosities: Secondary | ICD-10-CM | POA: Diagnosis not present

## 2023-10-04 DIAGNOSIS — Z794 Long term (current) use of insulin: Secondary | ICD-10-CM

## 2023-10-04 MED ORDER — CICLOPIROX 8 % EX SOLN: Freq: Every day | CUTANEOUS | 2 refills | Status: DC

## 2023-10-04 NOTE — Progress Notes (Signed)
 Subjective: Chief Complaint  Patient presents with   [facility]    RM#11 [facility] patient states has a bruise on right big toe.     68 year old female with the above concerns.  She has noticed improved, callus on the left big toe.  This started after which she went to pair of shoes.  No open lesions or any drainage.  No recent treatment for this.  No other concerns.  Objective: AAO x3, NAD DP/PT pulses palpable bilaterally, CRT less than 3 seconds Incision from prior surgery in the right hallux is well-healed. Toenails are hypertrophic, dystrophic, brittle, discolored, elongated 9.   No surrounding redness or drainage. 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 No signs of infection.   On the left hallux, submetatarsal 1 is a preulcerative hyperkeratotic lesion with dried blood.  Upon debridement there is no underlying ulceration, drainage or signs of infection. No pain with calf compression, swelling, warmth, erythema  Assessment: Symptomatic onychomycosis; preulcerative calluses x 2  Plan: -All treatment options discussed with the patient including all alternatives, risks, complications.  -Debrided nails x 9 without any complications or bleeding.  Prescribed Penlac for fungus. -Sharp debrided hyperkeratotic lesions x 2 to the medial left hallux, submetatarsal 1 without any complications or bleeding.  Monitoring signs or symptoms of infection.  Moisturizer daily, offloading.  Advised not to wear the shoes that she was wearing because of these. -Gabapentin for neuropathy -Daily foot inspection and glucose control -Monitor for any clinical signs or symptoms of infection and directed to call the office immediately should any occur or go to the ER.  Return in about 3 months (around 01/01/2024).  Vivi Barrack DPM

## 2023-10-17 ENCOUNTER — Ambulatory Visit: Admitting: Podiatry

## 2023-10-17 ENCOUNTER — Encounter: Payer: Self-pay | Admitting: Podiatry

## 2023-10-17 ENCOUNTER — Ambulatory Visit (INDEPENDENT_AMBULATORY_CARE_PROVIDER_SITE_OTHER)

## 2023-10-17 DIAGNOSIS — M79672 Pain in left foot: Secondary | ICD-10-CM

## 2023-10-17 DIAGNOSIS — R52 Pain, unspecified: Secondary | ICD-10-CM | POA: Diagnosis not present

## 2023-10-17 DIAGNOSIS — L03032 Cellulitis of left toe: Secondary | ICD-10-CM

## 2023-10-17 MED ORDER — DOXYCYCLINE HYCLATE 100 MG PO TABS: 100.0000 mg | ORAL_TABLET | Freq: Two times a day (BID) | ORAL | 0 refills | Status: DC

## 2023-10-17 MED ORDER — MUPIROCIN 2 % EX OINT: 1.0000 | TOPICAL_OINTMENT | Freq: Two times a day (BID) | CUTANEOUS | 2 refills | Status: AC

## 2023-10-17 NOTE — Progress Notes (Signed)
 Subjective: Chief Complaint  Patient presents with   Nail Problem    RM#13 left hallux is bruised, no injury that she knows of, dried blood around nail, pt stats nail just doesnt look good.   68 year old female presents the office with above concerns, which are new.  She is in the left big toenail looks bruised and she has blood underneath the toenail.  She is not sure if she hit it or if it was rubbing inside of her shoes.  She has neuropathy but she does states she has a little bit of discomfort around the toenail.  No drainage or pus.  No fevers or chills.  No other concerns.  Objective: AAO x3, NAD DP/PT pulses palpable bilaterally, CRT less than 3 seconds Left hallux toenail is loose with underlying nailbed.  There is dried blood and old subungual hematoma present on the toenail.  I did debride the nail there is serous drainage from toenail.  See procedure note below.  There is dried blood around the toenail No pain with calf compression, swelling, warmth, erythema  Assessment: Left hallux onycholysis, localized infection  Plan: -All treatment options discussed with the patient including all alternatives, risks, complications.  -I certify debrided the nail however there was some drainage underneath the toenail and nail is loose and wound was already present.  Due to this I recommended an removal, I&D.  After discussion she agrees to proceed.  I cleaned skin with alcohol mixture of 3 mL of lidocaine, Marcaine plain was infiltrated in a digital block fashion.  The skin was then prepped with Betadine.  Tourniquet applied.  I then removed the toenail next remove all nail borders.  After debridement there is no further drainage and the underlying nail bed intact.  No lacerations present.  I irrigated with saline, alcohol.  Silvadene applied followed by dressing.  Tourniquet was released and there is found to be an immediate Reveals on the toes.  Tolerated well. -Prescribed doxycycline as well as  mupirocin to apply topically. -Surgical shoe dispensed for offloading help facilitate soft tissue healing.  Her oral shoe with no longer fitting correctly. -Encouraged glucose control -Patient encouraged to call the office with any questions, concerns, change in symptoms.   Vivi Barrack DPM

## 2023-10-18 ENCOUNTER — Ambulatory Visit: Payer: BC Managed Care – PPO | Admitting: Family Medicine

## 2023-10-18 ENCOUNTER — Encounter: Payer: Self-pay | Admitting: Family Medicine

## 2023-10-18 DIAGNOSIS — E7800 Pure hypercholesterolemia, unspecified: Secondary | ICD-10-CM

## 2023-10-18 DIAGNOSIS — Z794 Long term (current) use of insulin: Secondary | ICD-10-CM

## 2023-10-18 DIAGNOSIS — E1165 Type 2 diabetes mellitus with hyperglycemia: Secondary | ICD-10-CM

## 2023-10-18 DIAGNOSIS — I10 Essential (primary) hypertension: Secondary | ICD-10-CM | POA: Diagnosis not present

## 2023-10-18 MED ORDER — LOSARTAN POTASSIUM 100 MG PO TABS: 100.0000 mg | ORAL_TABLET | Freq: Every day | ORAL | 3 refills | Status: AC

## 2023-10-18 NOTE — Patient Instructions (Signed)
 A1c today.  Losartan refilled.  Follow up in 6 months.  Take care  Dr. Adriana Simas

## 2023-10-19 LAB — HEMOGLOBIN A1C
Est. average glucose Bld gHb Est-mCnc: 194 mg/dL
Hgb A1c MFr Bld: 8.4 % — ABNORMAL HIGH (ref 4.8–5.6)

## 2023-10-20 ENCOUNTER — Encounter: Payer: Self-pay | Admitting: Family Medicine

## 2023-10-21 NOTE — Assessment & Plan Note (Addendum)
 A1c remains uncontrolled at 8.4.  Patient has discontinued Marcelline Deist due to yeast infection.  Continue Lantus and NovoLog as well as metformin.  Has follow-up with endocrinology.

## 2023-10-21 NOTE — Progress Notes (Signed)
 Subjective:  Patient ID: [name] [name], female    DOB: 09/15/1955  Age: 68 y.o. MRN: [id number]  CC:   Chief Complaint  Patient presents with   Hypertension    Follow up    HPI:  68 year old female presents for follow-up.  Patient's hypertension is stable on losartan.  Needs refill.  Patient follows with endocrinology.  She states that she feels like her glycemic control is improving.  Needs A1c.  Compliance is the issue.  She has not yet started using her freestyle libre.  LDL at goal on atorvastatin.  Patient Active Problem List   Diagnosis Date Noted   Uncontrolled type 2 diabetes mellitus with hyperglycemia (HCC) 10/02/2021   Diabetic retinopathy associated with type 2 diabetes mellitus (HCC) 12/22/2020   History of CVA (cerebrovascular accident) 12/22/2020   Long term (current) use of insulin (HCC) 12/22/2020   Polyneuropathy due to type 2 diabetes mellitus (HCC) 12/22/2020   Heart murmur, systolic 06/27/2020   HLD (hyperlipidemia) 04/18/2018   Essential hypertension 02/24/2018   History of osteomyelitis- rt great toe 04/22/2016    Social Hx   Social History   Socioeconomic History   Marital status: Married    Spouse name: Educational psychologist   Number of children: 2   Years of education: Not on file   Highest education level: Some college, no degree  Occupational History    Employer: [employer]  Tobacco Use   Smoking status: Never    Passive exposure: Past   Smokeless tobacco: Never  Substance and Sexual Activity   Alcohol use: No   Drug use: No   Sexual activity: Not on file  Other Topics Concern   Not on file  Social History Narrative   Patient is left-handed. She lives with her husband in a one level home with a basement. She occasionally drinks coffee and has 2-3 diest sodas a day. She does not exercise.   Social Drivers of Corporate investment banker Strain: Not on file  Food Insecurity: No Food Insecurity (05/09/2022)   Hunger Vital Sign    Worried About  Running Out of Food in the Last Year: Never true    Ran Out of Food in the Last Year: Never true  Transportation Needs: No Transportation Needs (05/09/2022)   PRAPARE - Administrator, Civil Service (Medical): No    Lack of Transportation (Non-Medical): No  Physical Activity: Not on file  Stress: Not on file  Social Connections: Unknown (12/24/2021)   Received from [facility]   Social Network    Social Network: Not on file    Review of Systems  Constitutional: Negative.   Cardiovascular: Negative.      Objective:  BP 136/68   Pulse 83   Temp (!) 97.3 F (36.3 C)   Ht 5\' 2"  (1.575 m)   Wt 168 lb (76.2 kg)   SpO2 97%   BMI 30.73 kg/m      10/18/2023    8:49 AM 07/18/2023   10:40 AM 01/17/2023    3:32 PM  BP/Weight  Systolic BP 136 148 150  Diastolic BP 68 60 70  Wt. (Lbs) 168 164 166.2  BMI 30.73 kg/m2 30 kg/m2 30.4 kg/m2    Physical Exam Constitutional:      General: She is not in acute distress.    Appearance: Normal appearance.  HENT:     Head: Normocephalic and atraumatic.  Cardiovascular:     Rate and Rhythm: Normal rate  and regular rhythm.     Heart sounds: Murmur heard.  Pulmonary:     Effort: Pulmonary effort is normal.     Breath sounds: Normal breath sounds. No wheezing, rhonchi or rales.  Neurological:     Mental Status: She is alert.  Psychiatric:        Mood and Affect: Mood normal.        Behavior: Behavior normal.     Lab Results  Component Value Date   WBC 11.2 (H) 07/18/2023   HGB 13.4 07/18/2023   HCT 42.2 07/18/2023   PLT 266 07/18/2023   GLUCOSE 87 07/18/2023   CHOL 126 07/18/2023   TRIG 168 (H) 07/18/2023   HDL 58 07/18/2023   LDLCALC 41 07/18/2023   ALT 16 07/18/2023   AST 22 07/18/2023   NA 139 07/18/2023   K 4.1 07/18/2023   CL 102 07/18/2023   CREATININE 1.12 (H) 07/18/2023   BUN 21 07/18/2023   CO2 21 07/18/2023   TSH 1.960 02/24/2018   INR 1.1 03/09/2019   HGBA1C 8.4 (H) 10/18/2023      Assessment & Plan:  Uncontrolled type 2 diabetes mellitus with hyperglycemia (HCC) Assessment & Plan: A1c remains uncontrolled at 8.4.  Patient has discontinued Marcelline Deist due to yeast infection.  Continue Lantus and NovoLog as well as metformin.  Has follow-up with endocrinology.  Orders: -     Hemoglobin A1c  Essential hypertension Assessment & Plan: Stable.  Losartan refilled.   Orders: -     Losartan Potassium; Take 1 tablet (100 mg total) by mouth daily.  Dispense: 90 tablet; Refill: 3  Pure hypercholesterolemia Assessment & Plan: At goal on atorvastatin.     Follow-up: 6 months  Jazmin Ley Adriana Simas DO [facility]

## 2023-10-21 NOTE — Assessment & Plan Note (Signed)
At goal on atorvastatin.  

## 2023-10-21 NOTE — Assessment & Plan Note (Signed)
 Stable.  Losartan refilled.

## 2023-10-22 ENCOUNTER — Other Ambulatory Visit: Payer: Self-pay

## 2023-10-22 DIAGNOSIS — E1165 Type 2 diabetes mellitus with hyperglycemia: Secondary | ICD-10-CM

## 2023-11-01 ENCOUNTER — Ambulatory Visit (INDEPENDENT_AMBULATORY_CARE_PROVIDER_SITE_OTHER): Admitting: Podiatry

## 2023-11-01 DIAGNOSIS — L03032 Cellulitis of left toe: Secondary | ICD-10-CM

## 2023-11-01 MED ORDER — DOXYCYCLINE HYCLATE 100 MG PO TABS: 100.0000 mg | ORAL_TABLET | Freq: Two times a day (BID) | ORAL | 0 refills | Status: DC

## 2023-11-05 NOTE — Progress Notes (Signed)
 Chief Complaint  Patient presents with   Nail Problem    RM#12 Follow up right foot big toe after removal.    68 year old female presents the office with above concerns.  She states that she has been healing well from having the toenail removed.  She denies any drainage or pus but still having some slight redness and swelling on the toe just behind the nails.  She does not report any fevers or chills.   Objective: AAO x3, NAD DP/PT pulses palpable bilaterally, CRT less than 3 seconds Scabbing is present along the hallux nail bed.  There is still some mild localized edema erythema along the proximal nail fold there is no drainage or pus or ascending cellulitis.  There is no fluctuation or crepitation, malodor.  We also callus in the submetatarsal 1 without any underlying ulceration, drainage or any signs of infection today.  No new ulcerations identified to the left foot.  No pain with calf compression, swelling, warmth, erythema  Assessment: Left hallux onycholysis, localized infection  Plan: -Although the wound itself appears to be healing well there is still some localized edema and erythema.  Due to this clinic continue antibiotic.  Prescribed doxycycline.  Discussed Epsom salt soaks for short distances but may consider somebody else check for temperature of the water given her neuropathy.  Discussed drying well afterwards.  Keep a small amount of antibiotic ointment followed by dressing of the big toe daily beginning the area open at nighttime.  Discussed shoes avoid excess pressure open.  She went to the seals, results. -Monitor for any clinical signs or symptoms of infection and directed to call the office immediately should any occur or go to the ER.  Return in about 2 weeks (around 11/15/2023).  Vivi Barrack DPM

## 2023-11-15 ENCOUNTER — Ambulatory Visit: Admitting: Podiatry

## 2024-01-02 ENCOUNTER — Encounter: Payer: Self-pay | Admitting: Podiatry

## 2024-01-02 ENCOUNTER — Ambulatory Visit (INDEPENDENT_AMBULATORY_CARE_PROVIDER_SITE_OTHER): Payer: Medicare Other | Admitting: Podiatry

## 2024-01-02 DIAGNOSIS — M792 Neuralgia and neuritis, unspecified: Secondary | ICD-10-CM

## 2024-01-02 DIAGNOSIS — M79674 Pain in right toe(s): Secondary | ICD-10-CM | POA: Diagnosis not present

## 2024-01-02 DIAGNOSIS — E1142 Type 2 diabetes mellitus with diabetic polyneuropathy: Secondary | ICD-10-CM | POA: Diagnosis not present

## 2024-01-02 DIAGNOSIS — M79675 Pain in left toe(s): Secondary | ICD-10-CM | POA: Diagnosis not present

## 2024-01-02 DIAGNOSIS — B351 Tinea unguium: Secondary | ICD-10-CM | POA: Diagnosis not present

## 2024-01-02 DIAGNOSIS — Z794 Long term (current) use of insulin: Secondary | ICD-10-CM

## 2024-01-02 NOTE — Progress Notes (Unsigned)
 Ref to pain Nueropathy  going past knee; hands

## 2024-01-15 ENCOUNTER — Encounter

## 2024-01-16 ENCOUNTER — Encounter: Payer: Self-pay | Admitting: Physical Medicine and Rehabilitation

## 2024-02-03 ENCOUNTER — Ambulatory Visit
Admission: RE | Admit: 2024-02-03 | Discharge: 2024-02-03 | Disposition: A | Discharge status: Home / Self Care | Source: Ambulatory Visit | Attending: Family Medicine | Admitting: Family Medicine

## 2024-02-03 DIAGNOSIS — Z1231 Encounter for screening mammogram for malignant neoplasm of breast: Secondary | ICD-10-CM

## 2024-02-04 ENCOUNTER — Encounter

## 2024-03-11 ENCOUNTER — Encounter: Attending: Physical Medicine and Rehabilitation | Admitting: Physical Medicine and Rehabilitation

## 2024-03-11 ENCOUNTER — Encounter: Payer: Self-pay | Admitting: Physical Medicine and Rehabilitation

## 2024-03-11 VITALS — BP 129/78 | HR 68 | Ht 62.0 in | Wt 167.0 lb

## 2024-03-11 DIAGNOSIS — E1142 Type 2 diabetes mellitus with diabetic polyneuropathy: Secondary | ICD-10-CM | POA: Insufficient documentation

## 2024-03-11 DIAGNOSIS — Z794 Long term (current) use of insulin: Secondary | ICD-10-CM

## 2024-03-11 DIAGNOSIS — G894 Chronic pain syndrome: Secondary | ICD-10-CM | POA: Insufficient documentation

## 2024-03-11 MED ORDER — GABAPENTIN 100 MG PO CAPS: ORAL_CAPSULE | ORAL | 2 refills | Status: DC

## 2024-03-11 NOTE — Progress Notes (Signed)
 Subjective:    Patient ID: [name] [name], female    DOB: [birth year], 68 y.o.   MRN: [id number]  HPI  HPI  [name] [name] is a 68 y.o. year old female  who  has a past medical history of CVA (cerebral vascular accident) (HCC) (02/24/2018), Diabetes (HCC) (02/24/2018), Hypertension, Microproteinuria, Neuropathy, Noncompliance, Osteopenia, Stroke (HCC), Type 2 diabetes mellitus (HCC), and Vertigo.   They are presenting to PM&R clinic as a new patient for pain management evaluation.   Source: Bilateral foot tingling and pain, peripheral distribution Inciting incident: Progressive worsening, particularly noticeable this year with gait changes Description of pain: Tingling sensation in both feet, progressive worsening affecting walking pattern and gait symmetry Exacerbating factors: Uneven terrain, hills Remitting factors: None identified Red flag symptoms: None  Medications tried: Topical: None tried NSAID: Patient advised against ibuprofen due to elevated kidney function Tylenol : None tried Gabapentin : None tried Lyrica: None tried SNRIs: None tried TCAs: None tried Muscle relaxers: None tried Opiates: None tried Other: None tried  Other treatments: Physical/occupational therapy: None tried Acupuncture/chiropractic/massage: None tried TENS unit: None tried Heat: None tried Injections: None tried Surgery: Previous wound care treatment in 2023 for small foot wound, resolved without infection or osteomyelitis Other: Uses phone app to monitor gait asymmetry  Goals for pain control: To get things in a good place and maintain mobility  Prior UDS results: None mentioned  Prior pain practice: None mentioned   Prior UDS results: No results found for: LABOPIA, COCAINSCRNUR, LABBENZ, AMPHETMU, THCU, LABBARB   Pain Inventory Average Pain 6 Pain Right Now 3 My pain is burning and tingling  In the last 24 hours, has pain interfered with the following? General activity  5 Relation with others 5 Enjoyment of life 5 What TIME of day is your pain at its worst? night Sleep (in general) fair - good  Pain is worse with: sitting Pain improves with: pacing activities and medication Relief from Meds: 5  walk without assistance walk with assistance use a cane use a walker ability to climb steps?  yes do you drive?  yes  retired  bladder control problems numbness tingling trouble walking dizziness  Any changes since last visit?  no  Any changes since last visit?  no    Family History  Problem Relation Age of Onset   Diabetes Mother    Breast cancer Mother    Diabetes Father    Deep vein thrombosis Sister    Social History   Socioeconomic History   Marital status: Married    Spouse name: Dasie   Number of children: 2   Years of education: Not on file   Highest education level: Some college, no degree  Occupational History    Employer: [employer]  Tobacco Use   Smoking status: Never    Passive exposure: Past   Smokeless tobacco: Never  Substance and Sexual Activity   Alcohol use: No   Drug use: No   Sexual activity: Not on file  Other Topics Concern   Not on file  Social History Narrative   Patient is left-handed. She lives with her husband in a one level home with a basement. She occasionally drinks coffee and has 2-3 diest sodas a day. She does not exercise.   Social Drivers of Corporate investment banker Strain: Not on file  Food Insecurity: No Food Insecurity (05/09/2022)   Hunger Vital Sign    Worried About Running Out of Food in the Last Year: Never  true    Ran Out of Food in the Last Year: Never true  Transportation Needs: No Transportation Needs (05/09/2022)   PRAPARE - Administrator, Civil Service (Medical): No    Lack of Transportation (Non-Medical): No  Physical Activity: Not on file  Stress: Not on file  Social Connections: Unknown (12/24/2021)   Received from [facility]   Social Network     Social Network: Not on file   Past Surgical History:  Procedure Laterality Date   ABDOMINAL HYSTERECTOMY     AMPUTATION Right 05/09/2022   Procedure: AMPUTATION OF DIGIT Right Great Toe;  Surgeon: Malvin Marsa FALCON, DPM;  Location: [facility];  Service: Podiatry;  Laterality: Right;   BACK SURGERY     Past Medical History:  Diagnosis Date   CVA (cerebral vascular accident) (HCC) 02/24/2018   Diabetes (HCC) 02/24/2018   Hypertension    Microproteinuria    Neuropathy    Noncompliance    Osteopenia    Stroke (HCC)    residual balance issues   Type 2 diabetes mellitus (HCC)    Vertigo    There were no vitals taken for this visit.  Opioid Risk Score:   Fall Risk Score:  `1  Depression screen [facility] 2/9     01/17/2023    3:35 PM 12/26/2020   10:15 AM 06/27/2020    9:50 AM 03/20/2018    9:58 AM 04/12/2016    2:21 PM 03/15/2016    2:21 PM  Depression screen PHQ 2/9  Decreased Interest 0 0 0 0 0 0  Down, Depressed, Hopeless 1 0 0 0 0 0  PHQ - 2 Score 1 0 0 0 0 0  Altered sleeping 0   0    Tired, decreased energy 1   0    Change in appetite 0   0    Feeling bad or failure about yourself  0   0    Trouble concentrating 0   0    Moving slowly or fidgety/restless 0   0    Suicidal thoughts 0   0    PHQ-9 Score 2   0    Difficult doing work/chores Not difficult at all   Not difficult at all      Review of Systems  Cardiovascular:  Positive for leg swelling.       Swelling in both legs (per pt due to salt)  Genitourinary:        Bladder leakage  Musculoskeletal:  Positive for gait problem.       Pain in both legs knees down to feet  Skin:  Positive for rash.       Left lower buttock area itching area   Neurological:  Positive for dizziness and numbness.       Tingling  All other systems reviewed and are negative.      Objective:   Physical Exam Constitution: Appropriate appearance for age. No apparent distress Resp: No respiratory distress. No accessory muscle  usage. Cardio: Well perfused appearance. No peripheral edema. Abdomen: Nondistended. Nontender. Psych: Appropriate mood and affect. Skin: c/d/I  Neurologic: AAOx4. No apparent cognitive deficits DTRs: Reflexes were 2+ in bilateral achilles, patella, biceps, BR and triceps. Babinsky: flexor responses b/l. Hoffmans: negative b/l Sensory exam: revealed stocking pattern sensory loss in BL feet Motor exam: strength 5/5 throughout Coordination: Fine motor coordination was normal. Gait: normal  Musculoskeletal: Knee flexion and extension normal bilaterally. Ankle dorsiflexion and plantarflexion intact.  Assessment & Plan:  [name] [name] is a 68 y.o. year old female  who  has a past medical history of CVA (cerebral vascular accident) (HCC) (02/24/2018), Diabetes (HCC) (02/24/2018), Hypertension, Microproteinuria, Neuropathy, Noncompliance, Osteopenia, Stroke (HCC), Type 2 diabetes mellitus (HCC), and Vertigo.   They are presenting to PM&R clinic as a new patient for treatment of peripheral polyneuropathy pain.   Chronic pain syndrome Polyneuropathy due to type 2 diabetes mellitus (HCC)  Diabetic peripheral neuropathy  - Cr 1.1-1.3 on record review;  Advised continued avoidance of NSAIDs due to elevated kidney function in setting of diabetes  Start  gabapentin  100 mg one capsule at night time for 1 week, then if no side effects increase to one capsule three times daily. Dose limited due to CKD  - Discussed topical treatment options including lidocaine  cream for numbing during severe burning episodes  - Discussed capsaicin cream application technique: apply for burning sensation, remove after 20 minutes  - Discussed Qutenza patch as potential treatment option  - EMG nerve conduction study available for diagnostic confirmation if needed, though clinical presentation classic for diabetic neuropathy  Type 2 diabetes mellitus - Continue current diabetic management - Discussed  importance of glycemic control for neuropathy progression  Other orders -     Gabapentin ; take one capsule at night time for 1 week, then if no side effects increase to one capsule three times daily  Dispense: 90 capsule; Refill: 2

## 2024-03-18 DIAGNOSIS — G894 Chronic pain syndrome: Secondary | ICD-10-CM | POA: Insufficient documentation

## 2024-04-03 ENCOUNTER — Encounter: Payer: Self-pay | Admitting: Podiatry

## 2024-04-03 ENCOUNTER — Ambulatory Visit (INDEPENDENT_AMBULATORY_CARE_PROVIDER_SITE_OTHER): Admitting: Podiatry

## 2024-04-03 DIAGNOSIS — M79674 Pain in right toe(s): Secondary | ICD-10-CM

## 2024-04-03 DIAGNOSIS — B351 Tinea unguium: Secondary | ICD-10-CM

## 2024-04-03 DIAGNOSIS — E1142 Type 2 diabetes mellitus with diabetic polyneuropathy: Secondary | ICD-10-CM

## 2024-04-03 DIAGNOSIS — M79675 Pain in left toe(s): Secondary | ICD-10-CM | POA: Diagnosis not present

## 2024-04-03 DIAGNOSIS — Z794 Long term (current) use of insulin: Secondary | ICD-10-CM

## 2024-04-06 NOTE — Progress Notes (Signed)
 Chief Complaint  Patient presents with   Diabetes    DFC IDDM A1C 8.4. Toenail trim and callus care. Pt. Taking Gabapentin  for Neuropathy.    68 year old female presents the office with above concerns.  Her nails are thickened elongated she has difficulty trimming herself may cause discomfort as they can elongated.  She does not report any ulcerations.   As well as neuropathy symptoms.  Objective: AAO x3, NAD DP/PT pulses palpable bilaterally, CRT less than 3 seconds Sensation decreased with SWMF. Nails are hypertrophic, dystrophic, brittle, discolored, elongated 9. No surrounding redness or drainage. Tenderness nails 1-5 on the left and 2 through 5 on the right. No open lesions or pre-ulcerative lesions are identified today. No pain with calf compression, swelling, warmth, erythema  Assessment: Symptomatic onychomycosis; neuropathic pain  Plan: Symptomatic onychomycosis -Sharply debrided nails x 9 without any complications or bleeding -Discussed daily foot inspection and proper shoe gear  Neuropathic pain, worsening - Previous referral to pain management  Return in about 3 months (around 07/04/2024).   Donnice JONELLE Fees DPM

## 2024-04-08 ENCOUNTER — Encounter: Payer: Self-pay | Admitting: Family Medicine

## 2024-04-08 ENCOUNTER — Ambulatory Visit: Admitting: Family Medicine

## 2024-04-08 VITALS — BP 132/70 | HR 80 | Temp 97.4°F | Ht 62.0 in | Wt 166.0 lb

## 2024-04-08 DIAGNOSIS — R21 Rash and other nonspecific skin eruption: Secondary | ICD-10-CM | POA: Insufficient documentation

## 2024-04-08 DIAGNOSIS — N393 Stress incontinence (female) (male): Secondary | ICD-10-CM | POA: Diagnosis not present

## 2024-04-08 DIAGNOSIS — I10 Essential (primary) hypertension: Secondary | ICD-10-CM | POA: Diagnosis not present

## 2024-04-08 DIAGNOSIS — E1165 Type 2 diabetes mellitus with hyperglycemia: Secondary | ICD-10-CM

## 2024-04-08 DIAGNOSIS — E7800 Pure hypercholesterolemia, unspecified: Secondary | ICD-10-CM

## 2024-04-08 MED ORDER — FLUCONAZOLE 150 MG PO TABS: 150.0000 mg | ORAL_TABLET | ORAL | 0 refills | Status: AC

## 2024-04-08 NOTE — Assessment & Plan Note (Signed)
 Glycemic control has improved significantly.  Continue current treatment.  Close follow-up with endocrinology.

## 2024-04-08 NOTE — Patient Instructions (Addendum)
 Medication as directed.  Referral placed.  Follow up in 6 months.  Take care  Dr. Bluford

## 2024-04-08 NOTE — Progress Notes (Signed)
 Subjective:  Patient ID: [name] [name], female    DOB: 1955-11-16  Age: 68 y.o. MRN: [id number]  CC:   Chief Complaint  Patient presents with   Diabetes    Blood sugars below 70 this morning, shakes   rash on outer left thigh    Since June beach trip   Medication Refill    HPI:  68 year old female with the below mentioned medical problems presents for follow-up.  Since getting freestyle libre her glycemic control has improved.  She follows with endocrinology.  Average blood glucose over the past 3 months was 162.  This is a dramatic improvement.  She continues on Humalog  and Lantus  as well as metformin .  LDL at goal on atorvastatin .  Patient reports that she is issues with incontinence.  Seems to be stress incontinence as it happens particularly with standing and activity.  She states that she is having to wear a pad often.  Patient also reports that she is having a rash to the thigh.  Started in June.  She has been using topical hydrocortisone with some improvement but no resolution.  It seems to mostly help with the itching.  Blood pressure is stable on losartan .  Patient Active Problem List   Diagnosis Date Noted   Rash 04/08/2024   Stress incontinence 04/08/2024   Chronic pain syndrome 03/18/2024   Uncontrolled type 2 diabetes mellitus with hyperglycemia (HCC) 10/02/2021   Diabetic retinopathy associated with type 2 diabetes mellitus (HCC) 12/22/2020   History of CVA (cerebrovascular accident) 12/22/2020   Long term (current) use of insulin  (HCC) 12/22/2020   Polyneuropathy due to type 2 diabetes mellitus (HCC) 12/22/2020   Heart murmur, systolic 06/27/2020   HLD (hyperlipidemia) 04/18/2018   Essential hypertension 02/24/2018   History of osteomyelitis- rt great toe 04/22/2016    Social Hx   Social History   Socioeconomic History   Marital status: Married    Spouse name: Educational psychologist   Number of children: 2   Years of education: Not on file   Highest education level:  Some college, no degree  Occupational History    Employer: [employer]  Tobacco Use   Smoking status: Never    Passive exposure: Past   Smokeless tobacco: Never  Vaping Use   Vaping status: Never Used  Substance and Sexual Activity   Alcohol use: No   Drug use: No   Sexual activity: Not on file  Other Topics Concern   Not on file  Social History Narrative   Patient is left-handed. She lives with her husband in a one level home with a basement. She occasionally drinks coffee and has 2-3 diest sodas a day. She does not exercise.   Social Drivers of Corporate investment banker Strain: Not on file  Food Insecurity: No Food Insecurity (05/09/2022)   Hunger Vital Sign    Worried About Running Out of Food in the Last Year: Never true    Ran Out of Food in the Last Year: Never true  Transportation Needs: No Transportation Needs (05/09/2022)   PRAPARE - Administrator, Civil Service (Medical): No    Lack of Transportation (Non-Medical): No  Physical Activity: Not on file  Stress: Not on file  Social Connections: Unknown (12/24/2021)   Received from [facility]   Social Network    Social Network: Not on file    Review of Systems Per HPI  Objective:  BP 132/70   Pulse 80   Temp (!) 97.4  F (36.3 C)   Ht 5' 2 (1.575 m)   Wt 166 lb (75.3 kg)   SpO2 98%   BMI 30.36 kg/m      04/08/2024   10:27 AM 04/08/2024   10:00 AM 03/11/2024    2:36 PM  BP/Weight  Systolic BP 132 166 129  Diastolic BP 70 75 78  Wt. (Lbs)  166 167  BMI  30.36 kg/m2 30.54 kg/m2    Physical Exam Vitals and nursing note reviewed. Exam conducted with a chaperone present.  Constitutional:      General: She is not in acute distress.    Appearance: Normal appearance.  HENT:     Head: Normocephalic and atraumatic.  Cardiovascular:     Rate and Rhythm: Normal rate and regular rhythm.  Pulmonary:     Effort: Pulmonary effort is normal.     Breath sounds: Normal breath sounds.  Skin:     Comments: Rash noted to the left medial inner thigh as well as the posterior inner thigh extending to the buttocks.  Rash is erythematous.  Slightly dry.  Neurological:     Mental Status: She is alert.  Psychiatric:        Mood and Affect: Mood normal.        Behavior: Behavior normal.     Lab Results  Component Value Date   WBC 11.2 (H) 07/18/2023   HGB 13.4 07/18/2023   HCT 42.2 07/18/2023   PLT 266 07/18/2023   GLUCOSE 87 07/18/2023   CHOL 126 07/18/2023   TRIG 168 (H) 07/18/2023   HDL 58 07/18/2023   LDLCALC 41 07/18/2023   ALT 16 07/18/2023   AST 22 07/18/2023   NA 139 07/18/2023   K 4.1 07/18/2023   CL 102 07/18/2023   CREATININE 1.12 (H) 07/18/2023   BUN 21 07/18/2023   CO2 21 07/18/2023   TSH 1.960 02/24/2018   INR 1.1 03/09/2019   HGBA1C 8.4 (H) 10/18/2023     Assessment & Plan:  Essential hypertension Assessment & Plan: Stable.  Continue losartan .   Stress incontinence Assessment & Plan: Referring to urogynecology.  Orders: -     Ambulatory referral to Urogynecology  Rash Assessment & Plan: Most consistent with tinea.  Treating with Diflucan .  Orders: -     Fluconazole ; Take 1 tablet (150 mg total) by mouth once a week. For 4 weeks.  Dispense: 4 tablet; Refill: 0  Uncontrolled type 2 diabetes mellitus with hyperglycemia (HCC) Assessment & Plan: Glycemic control has improved significantly.  Continue current treatment.  Close follow-up with endocrinology.   Pure hypercholesterolemia Assessment & Plan: LDL at goal on statin.  Continue.     Follow-up: 6 months  Kaylor Simenson Bluford DO [facility]

## 2024-04-08 NOTE — Assessment & Plan Note (Signed)
 Most consistent with tinea.  Treating with Diflucan .

## 2024-04-08 NOTE — Assessment & Plan Note (Signed)
 LDL at goal on statin.  Continue.

## 2024-04-08 NOTE — Assessment & Plan Note (Signed)
 Stable. Continue losartan

## 2024-04-08 NOTE — Assessment & Plan Note (Signed)
 Referring to urogynecology.

## 2024-04-09 ENCOUNTER — Telehealth: Payer: Self-pay | Admitting: Podiatry

## 2024-04-09 ENCOUNTER — Encounter: Payer: Self-pay | Admitting: Podiatry

## 2024-04-09 NOTE — Telephone Encounter (Signed)
 Patient was seen on April 03, 2024, and was issued a renewed Handicap Placard. She has since misplaced the form and is requesting a replacement.Could you please provide physical restrictions and for how many months?

## 2024-04-10 NOTE — Telephone Encounter (Signed)
 Sent, thank you

## 2024-04-20 ENCOUNTER — Ambulatory Visit: Admitting: Family Medicine

## 2024-04-22 ENCOUNTER — Ambulatory Visit: Admitting: Family Medicine

## 2024-05-13 ENCOUNTER — Ambulatory Visit: Admitting: Physical Medicine and Rehabilitation

## 2024-05-20 ENCOUNTER — Encounter: Attending: Physical Medicine and Rehabilitation | Admitting: Physical Medicine and Rehabilitation

## 2024-05-20 ENCOUNTER — Encounter: Payer: Self-pay | Admitting: Physical Medicine and Rehabilitation

## 2024-05-20 VITALS — BP 148/94 | HR 84 | Ht 62.0 in | Wt 168.4 lb

## 2024-05-20 DIAGNOSIS — R7989 Other specified abnormal findings of blood chemistry: Secondary | ICD-10-CM | POA: Diagnosis not present

## 2024-05-20 DIAGNOSIS — E1142 Type 2 diabetes mellitus with diabetic polyneuropathy: Secondary | ICD-10-CM | POA: Insufficient documentation

## 2024-05-20 DIAGNOSIS — I10 Essential (primary) hypertension: Secondary | ICD-10-CM | POA: Diagnosis present

## 2024-05-20 DIAGNOSIS — Z7409 Other reduced mobility: Secondary | ICD-10-CM | POA: Insufficient documentation

## 2024-05-20 DIAGNOSIS — Z794 Long term (current) use of insulin: Secondary | ICD-10-CM

## 2024-05-20 DIAGNOSIS — G894 Chronic pain syndrome: Secondary | ICD-10-CM | POA: Diagnosis present

## 2024-05-20 MED ORDER — GABAPENTIN 100 MG PO CAPS: 100.0000 mg | ORAL_CAPSULE | Freq: Every evening | ORAL | Status: AC | PRN

## 2024-05-20 NOTE — Patient Instructions (Signed)
 Stop advil due to renal function and hypertension  Start gabapentin  100 mg at night as needed for burning pain in your feet; let me know if this medicaiton makes you too tired.  We are getting labs for your kidney function and other contributors to peripheral neuropathy. I will message you through mychart about these results.   Follow up with me in 6 months

## 2024-05-20 NOTE — Progress Notes (Signed)
 Subjective:    Patient ID: [name] [name], female    DOB: [birth year], 68 y.o.   MRN: [id number]  HPI  [name] [name] is a 68 y.o. year old female  who  has a past medical history of CVA (cerebral vascular accident) (HCC) (02/24/2018), Diabetes (HCC) (02/24/2018), Hypertension, Microproteinuria, Neuropathy, Noncompliance, Osteopenia, Stroke (HCC), Type 2 diabetes mellitus (HCC), and Vertigo.   They are presenting to PM&R clinic as a new patient for treatment of peripheral polyneuropathy pain.   Plan from last visit:  Chronic pain syndrome Polyneuropathy due to type 2 diabetes mellitus (HCC)   Diabetic peripheral neuropathy  - Cr 1.1-1.3 on record review;  Advised continued avoidance of NSAIDs due to elevated kidney function in setting of diabetes   Start  gabapentin  100 mg one capsule at night time for 1 week, then if no side effects increase to one capsule three times daily. Dose limited due to CKD   - Discussed topical treatment options including lidocaine  cream for numbing during severe burning episodes   - Discussed capsaicin cream application technique: apply for burning sensation, remove after 20 minutes   - Discussed Qutenza patch as potential treatment option   - EMG nerve conduction study available for diagnostic confirmation if needed, though clinical presentation classic for diabetic neuropathy   Type 2 diabetes mellitus - Continue current diabetic management - Discussed importance of glycemic control for neuropathy progression   Other orders -     Gabapentin ; take one capsule at night time for 1 week, then if no side effects increase to one capsule three times daily  Dispense: 90 capsule; Refill: 2     Interval Hx:  - Therapies: She has been walking in from the of tv with her walker, going up and down the length of the house; she fell on the street last year so she avoids walking outside. She has a bear in her neighborhood so that also limits her walking. She is  independent of ADLs - says she has to watch herself taking pots in and out of the over because it is low. She does not cook as much as she would like.    - Follow ups: Saw Dr Bluford in September; Ha1c down to 7.2, no other new concerns. No recent labs.    - Falls: none   - DME: She uses a walker intermittently when I get stiff either at nighttime or after taking long car rides. Once she is up and going, she uses no AD; she uses a tomato stake because it works better for her than a cane outside.     - Medications:  Patient never started gabapentin ; she says she did not think she needed it because advil worked and she was  Patient states if she takes a few advil at night she does not need anything in the morning   - Other concerns: she takes her BP daily at home, she says it usually runs 140-150 systolic.   Pain Inventory Average Pain 1 Pain Right Now 1 My pain is stabbing and tingling  In the last 24 hours, has pain interfered with the following? General activity N/A Relation with others N/A Enjoyment of life N/A What TIME of day is your pain at its worst? night Sleep (in general) Poor  Pain is worse with: unsure Pain improves with: pacing activities and medication Relief from Meds: 8  Family History  Problem Relation Age of Onset   Diabetes Mother    Breast cancer  Mother    Diabetes Father    Deep vein thrombosis Sister    Social History   Socioeconomic History   Marital status: Married    Spouse name: Dasie   Number of children: 2   Years of education: Not on file   Highest education level: Some college, no degree  Occupational History    Employer: [employer]  Tobacco Use   Smoking status: Never    Passive exposure: Past   Smokeless tobacco: Never  Vaping Use   Vaping status: Never Used  Substance and Sexual Activity   Alcohol use: No   Drug use: No   Sexual activity: Not on file  Other Topics Concern   Not on file  Social History Narrative   Patient is  left-handed. She lives with her husband in a one level home with a basement. She occasionally drinks coffee and has 2-3 diest sodas a day. She does not exercise.   Social Drivers of Corporate investment banker Strain: Not on file  Food Insecurity: No Food Insecurity (05/09/2022)   Hunger Vital Sign    Worried About Running Out of Food in the Last Year: Never true    Ran Out of Food in the Last Year: Never true  Transportation Needs: No Transportation Needs (05/09/2022)   PRAPARE - Administrator, Civil Service (Medical): No    Lack of Transportation (Non-Medical): No  Physical Activity: Not on file  Stress: Not on file  Social Connections: Unknown (12/24/2021)   Received from [facility]   Social Network    Social Network: Not on file   Past Surgical History:  Procedure Laterality Date   ABDOMINAL HYSTERECTOMY     AMPUTATION Right 05/09/2022   Procedure: AMPUTATION OF DIGIT Right Great Toe;  Surgeon: Malvin Marsa FALCON, DPM;  Location: [facility];  Service: Podiatry;  Laterality: Right;   BACK SURGERY     Past Surgical History:  Procedure Laterality Date   ABDOMINAL HYSTERECTOMY     AMPUTATION Right 05/09/2022   Procedure: AMPUTATION OF DIGIT Right Great Toe;  Surgeon: Malvin Marsa FALCON, DPM;  Location: [facility];  Service: Podiatry;  Laterality: Right;   BACK SURGERY     Past Medical History:  Diagnosis Date   CVA (cerebral vascular accident) (HCC) 02/24/2018   Diabetes (HCC) 02/24/2018   Hypertension    Microproteinuria    Neuropathy    Noncompliance    Osteopenia    Stroke (HCC)    residual balance issues   Type 2 diabetes mellitus (HCC)    Vertigo    BP (!) 148/94 (BP Location: Left Arm, Patient Position: Sitting, Cuff Size: Large) Comment: 2nd BP reading  Pulse 84   Ht 5' 2 (1.575 m)   Wt 168 lb 6.4 oz (76.4 kg)   SpO2 95%   BMI 30.80 kg/m   Opioid Risk Score:   Fall Risk Score:  `1  Depression screen PHQ 2/9     05/20/2024   12:10 PM  03/11/2024    2:42 PM 01/17/2023    3:35 PM 12/26/2020   10:15 AM 06/27/2020    9:50 AM 03/20/2018    9:58 AM 04/12/2016    2:21 PM  Depression screen PHQ 2/9  Decreased Interest 0 1 0 0 0 0 0  Down, Depressed, Hopeless 0 0 1 0 0 0 0  PHQ - 2 Score 0 1 1 0 0 0 0  Altered sleeping 1 1 0   0  Tired, decreased energy 1 0 1   0   Change in appetite 0 0 0   0   Feeling bad or failure about yourself  0 0 0   0   Trouble concentrating 0 0 0   0   Moving slowly or fidgety/restless 0 0 0   0   Suicidal thoughts 0 0 0   0   PHQ-9 Score 2 2 2    0   Difficult doing work/chores   Not difficult at all   Not difficult at all      Review of Systems  Musculoskeletal:        Bilateral foot pain  All other systems reviewed and are negative.      Objective:   Physical Exam   Physical Exam Constitution: Appropriate appearance for age. No apparent distress Resp: No respiratory distress. No accessory muscle usage. Cardio: Well perfused appearance. No peripheral edema. Abdomen: Nondistended. Nontender. Psych: Appropriate mood and affect. Skin: c/d/I   Neurologic: Sensory exam: revealed stocking pattern sensory loss in BL feet to mid-shin Motor exam: strength 5/5 throughout Coordination: Fine motor coordination was normal. Gait: normal; uses 3 point cane in L hand handle too long, has to reach out in front of her with imcomplete contact   Musculoskeletal: Knee flexion and extension normal bilaterally. Ankle dorsiflexion and plantarflexion intact.  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      Assessment & Plan:   [name] [name] is a 68 y.o. year old female  who  has a past medical history of CVA (cerebral vascular accident) (HCC) (02/24/2018), Diabetes (HCC) (02/24/2018), Hypertension, Microproteinuria, Neuropathy, Noncompliance, Osteopenia, Stroke (HCC), Type 2 diabetes mellitus (HCC), and Vertigo.   They are presenting to PM&R clinic as a new patient for treatment of peripheral polyneuropathy  pain.   Chronic pain syndrome Polyneuropathy due to type 2 diabetes mellitus (HCC) -     Basic metabolic panel with GFR -     A87 and Folate Panel -     TSH + free T4  Stop advil due to renal function and hypertension  Start gabapentin  100 mg at night as needed for burning pain in your feet; let me know if this medicaiton makes you too tired.  Follow up with me in 6 months  Elevated serum creatinine -     Basic metabolic panel with GFR We are getting labs for your kidney function and other contributors to peripheral neuropathy. I will message you through mychart about these results.   Essential hypertension -     Basic metabolic panel with GFR Following with Dr Bluford; stop Advil  Mobility impaired  We discussed proper height of cane at hip and complete contact with ambulation  Other orders -     Gabapentin ; Take 1 capsule (100 mg total) by mouth at bedtime as needed (peripheral burning / pain). take one capsule at night time for 1 week, then if no side effects increase to one capsule three times daily

## 2024-05-21 LAB — BASIC METABOLIC PANEL WITH GFR
BUN/Creatinine Ratio: 18 (ref 12–28)
BUN: 17 mg/dL (ref 8–27)
CO2: 26 mmol/L (ref 20–29)
Calcium: 10.1 mg/dL (ref 8.7–10.3)
Chloride: 104 mmol/L (ref 96–106)
Creatinine, Ser: 0.93 mg/dL (ref 0.57–1.00)
Glucose: 73 mg/dL (ref 70–99)
Potassium: 3.9 mmol/L (ref 3.5–5.2)
Sodium: 144 mmol/L (ref 134–144)
eGFR: 66 mL/min/1.73 (ref >59)

## 2024-05-21 LAB — B12 AND FOLATE PANEL
Folate: >20 ng/mL (ref >3.0)
Vitamin B-12: 314 pg/mL (ref 232–1245)

## 2024-05-21 LAB — TSH+FREE T4
Free T4: 1.13 ng/dL (ref 0.82–1.77)
TSH: 2.13 u[IU]/mL (ref 0.450–4.500)

## 2024-05-22 ENCOUNTER — Ambulatory Visit: Payer: Self-pay | Admitting: Physical Medicine and Rehabilitation

## 2024-07-02 ENCOUNTER — Encounter: Payer: Self-pay | Admitting: Podiatry

## 2024-07-02 ENCOUNTER — Ambulatory Visit: Admitting: Podiatry

## 2024-07-02 VITALS — Ht 62.0 in | Wt 168.4 lb

## 2024-07-02 DIAGNOSIS — E1142 Type 2 diabetes mellitus with diabetic polyneuropathy: Secondary | ICD-10-CM | POA: Diagnosis not present

## 2024-07-02 DIAGNOSIS — M79674 Pain in right toe(s): Secondary | ICD-10-CM

## 2024-07-02 DIAGNOSIS — M79675 Pain in left toe(s): Secondary | ICD-10-CM | POA: Diagnosis not present

## 2024-07-02 DIAGNOSIS — B351 Tinea unguium: Secondary | ICD-10-CM

## 2024-07-02 DIAGNOSIS — Z794 Long term (current) use of insulin: Secondary | ICD-10-CM

## 2024-07-03 ENCOUNTER — Telehealth: Payer: Self-pay

## 2024-07-03 NOTE — Telephone Encounter (Signed)
 Copied from CRM [id number]. Topic: General - Other >> Jul 03, 2024 12:03 PM Charlet HERO wrote: Reason for CRM: Patient is calling to get the handicap placard she is stating tht she gets temp one from foot doctor but it is about to run out and she has to pay $5 each time and she would like to get a permanent one but has to come from pcp. Her appt is not until Feb and would like to know what she needs to do. Please call the patient back

## 2024-07-08 NOTE — Telephone Encounter (Signed)
 Called patient and left voicemail that handicap placard has been filled out and she could come pick it up whenever

## 2024-07-08 NOTE — Progress Notes (Signed)
 Chief Complaint  Patient presents with   Nail Problem    Pt is here for [facility].     68 year old female presents the office with above concerns.  Her nails are thickened elongated she has difficulty trimming herself may cause discomfort as they can elongated.  She does not report any ulcerations. She reports her blood sugar has been doing better.   Objective: AAO x3, NAD DP/PT pulses palpable bilaterally, CRT less than 3 seconds Sensation decreased with SWMF. Nails are hypertrophic, dystrophic, brittle, discolored, elongated 9. No surrounding redness or drainage. Tenderness nails 1-5 on the left and 2 through 5 on the right. No open lesions or pre-ulcerative lesions are identified today. No pain with calf compression, swelling, warmth, erythema  Assessment: Symptomatic onychomycosis; neuropathic pain  Plan: Symptomatic onychomycosis -Sharply debrided nails x 9 without any complications or bleeding -Discussed daily foot inspection and proper shoe gear  Neuropathic pain, worsening - Followed up with pain management  Return in about 3 months (around 10/02/2024).  [name] [name] DPM

## 2024-08-09 ENCOUNTER — Other Ambulatory Visit: Payer: Self-pay | Admitting: Family Medicine

## 2024-08-26 ENCOUNTER — Other Ambulatory Visit: Payer: Self-pay | Admitting: Family Medicine

## 2024-09-05 ENCOUNTER — Other Ambulatory Visit: Payer: Self-pay | Admitting: Family Medicine

## 2024-10-02 ENCOUNTER — Ambulatory Visit: Admitting: Podiatry

## 2024-10-07 ENCOUNTER — Ambulatory Visit: Admitting: Family Medicine

## 2024-10-09 ENCOUNTER — Ambulatory Visit: Admitting: Family Medicine

## 2024-11-18 ENCOUNTER — Ambulatory Visit: Admitting: Physical Medicine and Rehabilitation
